# Patient Record
Sex: Female | Born: 1985 | Race: Black or African American | Hispanic: No | Marital: Single | Smoking: Never smoker
Health system: Southern US, Community
[De-identification: ages and names within clinical notes are randomized; demographics above are authoritative.]

## PROBLEM LIST (undated history)

## (undated) DIAGNOSIS — Z87898 Personal history of other specified conditions: Secondary | ICD-10-CM

## (undated) DIAGNOSIS — I272 Pulmonary hypertension, unspecified: Secondary | ICD-10-CM

## (undated) DIAGNOSIS — E669 Obesity, unspecified: Secondary | ICD-10-CM

## (undated) DIAGNOSIS — E039 Hypothyroidism, unspecified: Secondary | ICD-10-CM

## (undated) DIAGNOSIS — R0602 Shortness of breath: Secondary | ICD-10-CM

## (undated) DIAGNOSIS — Z6841 Body Mass Index (BMI) 40.0 and over, adult: Secondary | ICD-10-CM

## (undated) DIAGNOSIS — I5043 Acute on chronic combined systolic (congestive) and diastolic (congestive) heart failure: Principal | ICD-10-CM

## (undated) DIAGNOSIS — E042 Nontoxic multinodular goiter: Secondary | ICD-10-CM

## (undated) DIAGNOSIS — R51 Headache: Secondary | ICD-10-CM

## (undated) DIAGNOSIS — Z9889 Other specified postprocedural states: Secondary | ICD-10-CM

## (undated) DIAGNOSIS — S92909A Unspecified fracture of unspecified foot, initial encounter for closed fracture: Secondary | ICD-10-CM

## (undated) DIAGNOSIS — N921 Excessive and frequent menstruation with irregular cycle: Secondary | ICD-10-CM

## (undated) DIAGNOSIS — I5042 Chronic combined systolic (congestive) and diastolic (congestive) heart failure: Secondary | ICD-10-CM

## (undated) DIAGNOSIS — I340 Nonrheumatic mitral (valve) insufficiency: Secondary | ICD-10-CM

## (undated) DIAGNOSIS — D649 Anemia, unspecified: Secondary | ICD-10-CM

## (undated) DIAGNOSIS — I428 Other cardiomyopathies: Secondary | ICD-10-CM

## (undated) HISTORY — DX: Body Mass Index (BMI) 40.0 and over, adult: Z684

## (undated) HISTORY — PX: EXTERNAL EAR SURGERY: SHX627

## (undated) HISTORY — DX: Morbid (severe) obesity due to excess calories: E66.01

## (undated) HISTORY — DX: Chronic combined systolic (congestive) and diastolic (congestive) heart failure: I50.42

---

## 2013-09-11 ENCOUNTER — Inpatient Hospital Stay (HOSPITAL_COMMUNITY)
Admission: EM | Admit: 2013-09-11 | Discharge: 2013-09-14 | DRG: 286 | Disposition: A | Discharge status: Home / Self Care | Payer: BC Managed Care – PPO | Attending: Internal Medicine | Admitting: Internal Medicine

## 2013-09-11 DIAGNOSIS — I5043 Acute on chronic combined systolic (congestive) and diastolic (congestive) heart failure: Principal | ICD-10-CM | POA: Diagnosis present

## 2013-09-11 DIAGNOSIS — E042 Nontoxic multinodular goiter: Secondary | ICD-10-CM

## 2013-09-11 DIAGNOSIS — I059 Rheumatic mitral valve disease, unspecified: Secondary | ICD-10-CM | POA: Diagnosis present

## 2013-09-11 DIAGNOSIS — Z23 Encounter for immunization: Secondary | ICD-10-CM

## 2013-09-11 DIAGNOSIS — I272 Pulmonary hypertension, unspecified: Secondary | ICD-10-CM

## 2013-09-11 DIAGNOSIS — I2789 Other specified pulmonary heart diseases: Secondary | ICD-10-CM | POA: Diagnosis present

## 2013-09-11 DIAGNOSIS — Z79899 Other long term (current) drug therapy: Secondary | ICD-10-CM

## 2013-09-11 DIAGNOSIS — Z6841 Body Mass Index (BMI) 40.0 and over, adult: Secondary | ICD-10-CM

## 2013-09-11 DIAGNOSIS — R0602 Shortness of breath: Secondary | ICD-10-CM | POA: Diagnosis present

## 2013-09-11 DIAGNOSIS — J9600 Acute respiratory failure, unspecified whether with hypoxia or hypercapnia: Secondary | ICD-10-CM | POA: Diagnosis present

## 2013-09-11 DIAGNOSIS — E669 Obesity, unspecified: Secondary | ICD-10-CM | POA: Diagnosis present

## 2013-09-11 DIAGNOSIS — R0603 Acute respiratory distress: Secondary | ICD-10-CM | POA: Diagnosis present

## 2013-09-11 DIAGNOSIS — I509 Heart failure, unspecified: Secondary | ICD-10-CM | POA: Diagnosis present

## 2013-09-11 DIAGNOSIS — I340 Nonrheumatic mitral (valve) insufficiency: Secondary | ICD-10-CM

## 2013-09-11 DIAGNOSIS — D509 Iron deficiency anemia, unspecified: Secondary | ICD-10-CM

## 2013-09-11 DIAGNOSIS — J810 Acute pulmonary edema: Secondary | ICD-10-CM

## 2013-09-11 DIAGNOSIS — N920 Excessive and frequent menstruation with regular cycle: Secondary | ICD-10-CM | POA: Diagnosis present

## 2013-09-11 DIAGNOSIS — E039 Hypothyroidism, unspecified: Secondary | ICD-10-CM | POA: Diagnosis present

## 2013-09-11 DIAGNOSIS — Z8249 Family history of ischemic heart disease and other diseases of the circulatory system: Secondary | ICD-10-CM

## 2013-09-11 DIAGNOSIS — D500 Iron deficiency anemia secondary to blood loss (chronic): Secondary | ICD-10-CM | POA: Diagnosis present

## 2013-09-11 DIAGNOSIS — J189 Pneumonia, unspecified organism: Secondary | ICD-10-CM | POA: Diagnosis present

## 2013-09-11 HISTORY — DX: Obesity, unspecified: E66.9

## 2013-09-11 HISTORY — DX: Unspecified fracture of unspecified foot, initial encounter for closed fracture: S92.909A

## 2013-09-11 HISTORY — DX: Hypothyroidism, unspecified: E03.9

## 2013-09-11 HISTORY — DX: Pulmonary hypertension, unspecified: I27.20

## 2013-09-11 HISTORY — DX: Acute on chronic combined systolic (congestive) and diastolic (congestive) heart failure: I50.43

## 2013-09-11 HISTORY — DX: Excessive and frequent menstruation with irregular cycle: N92.1

## 2013-09-11 HISTORY — DX: Nontoxic multinodular goiter: E04.2

## 2013-09-11 HISTORY — DX: Nonrheumatic mitral (valve) insufficiency: I34.0

## 2013-09-11 HISTORY — DX: Anemia, unspecified: D64.9

## 2013-09-12 ENCOUNTER — Encounter (HOSPITAL_COMMUNITY): Payer: Self-pay | Admitting: Emergency Medicine

## 2013-09-12 ENCOUNTER — Emergency Department (HOSPITAL_COMMUNITY): Payer: BC Managed Care – PPO

## 2013-09-12 DIAGNOSIS — J189 Pneumonia, unspecified organism: Secondary | ICD-10-CM

## 2013-09-12 DIAGNOSIS — R0603 Acute respiratory distress: Secondary | ICD-10-CM | POA: Diagnosis present

## 2013-09-12 DIAGNOSIS — J810 Acute pulmonary edema: Secondary | ICD-10-CM | POA: Diagnosis present

## 2013-09-12 DIAGNOSIS — R0989 Other specified symptoms and signs involving the circulatory and respiratory systems: Secondary | ICD-10-CM

## 2013-09-12 DIAGNOSIS — I5043 Acute on chronic combined systolic (congestive) and diastolic (congestive) heart failure: Principal | ICD-10-CM

## 2013-09-12 DIAGNOSIS — J9600 Acute respiratory failure, unspecified whether with hypoxia or hypercapnia: Secondary | ICD-10-CM

## 2013-09-12 DIAGNOSIS — R0609 Other forms of dyspnea: Secondary | ICD-10-CM

## 2013-09-12 DIAGNOSIS — I340 Nonrheumatic mitral (valve) insufficiency: Secondary | ICD-10-CM

## 2013-09-12 DIAGNOSIS — I509 Heart failure, unspecified: Secondary | ICD-10-CM

## 2013-09-12 DIAGNOSIS — I059 Rheumatic mitral valve disease, unspecified: Secondary | ICD-10-CM

## 2013-09-12 DIAGNOSIS — R0602 Shortness of breath: Secondary | ICD-10-CM | POA: Diagnosis present

## 2013-09-12 HISTORY — DX: Acute on chronic combined systolic (congestive) and diastolic (congestive) heart failure: I50.43

## 2013-09-12 HISTORY — DX: Nonrheumatic mitral (valve) insufficiency: I34.0

## 2013-09-12 LAB — CBC WITH DIFFERENTIAL/PLATELET
Basophils Absolute: 0 10*3/uL (ref 0.0–0.1)
Basophils Relative: 0 % (ref 0–1)
EOS PCT: 1 % (ref 0–5)
Eosinophils Absolute: 0.1 10*3/uL (ref 0.0–0.7)
HCT: 31.7 % — ABNORMAL LOW (ref 36.0–46.0)
Hemoglobin: 9.8 g/dL — ABNORMAL LOW (ref 12.0–15.0)
LYMPHS PCT: 29 % (ref 12–46)
Lymphs Abs: 2 10*3/uL (ref 0.7–4.0)
MCH: 22.3 pg — ABNORMAL LOW (ref 26.0–34.0)
MCHC: 30.9 g/dL (ref 30.0–36.0)
MCV: 72 fL — ABNORMAL LOW (ref 78.0–100.0)
MONOS PCT: 9 % (ref 3–12)
Monocytes Absolute: 0.6 10*3/uL (ref 0.1–1.0)
NEUTROS PCT: 61 % (ref 43–77)
Neutro Abs: 4.1 10*3/uL (ref 1.7–7.7)
PLATELETS: 286 10*3/uL (ref 150–400)
RBC: 4.4 MIL/uL (ref 3.87–5.11)
RDW: 17.6 % — AB (ref 11.5–15.5)
WBC: 6.8 10*3/uL (ref 4.0–10.5)

## 2013-09-12 LAB — INFLUENZA PANEL BY PCR (TYPE A & B)
H1N1FLUPCR: NOT DETECTED
Influenza A By PCR: NEGATIVE
Influenza B By PCR: NEGATIVE

## 2013-09-12 LAB — BASIC METABOLIC PANEL
BUN: 14 mg/dL (ref 6–23)
CALCIUM: 8.7 mg/dL (ref 8.4–10.5)
CO2: 20 mEq/L (ref 19–32)
CREATININE: 0.94 mg/dL (ref 0.50–1.10)
Chloride: 103 mEq/L (ref 96–112)
GFR, EST NON AFRICAN AMERICAN: 83 mL/min — AB (ref >90)
Glucose, Bld: 118 mg/dL — ABNORMAL HIGH (ref 70–99)
Potassium: 3.6 mEq/L — ABNORMAL LOW (ref 3.7–5.3)
SODIUM: 138 meq/L (ref 137–147)

## 2013-09-12 LAB — T4, FREE: FREE T4: 1.4 ng/dL (ref 0.80–1.80)

## 2013-09-12 LAB — TROPONIN I
Troponin I: <0.3 ng/mL (ref <0.30)
Troponin I: <0.3 ng/mL (ref <0.30)
Troponin I: <0.3 ng/mL (ref <0.30)

## 2013-09-12 LAB — POCT I-STAT, CHEM 8
BUN: 13 mg/dL (ref 6–23)
CALCIUM ION: 1.16 mmol/L (ref 1.12–1.23)
CHLORIDE: 107 meq/L (ref 96–112)
Creatinine, Ser: 1 mg/dL (ref 0.50–1.10)
GLUCOSE: 117 mg/dL — AB (ref 70–99)
HEMATOCRIT: 35 % — AB (ref 36.0–46.0)
Hemoglobin: 11.9 g/dL — ABNORMAL LOW (ref 12.0–15.0)
Potassium: 3.7 mEq/L (ref 3.7–5.3)
Sodium: 142 mEq/L (ref 137–147)
TCO2: 23 mmol/L (ref 0–100)

## 2013-09-12 LAB — PREGNANCY, URINE: Preg Test, Ur: NEGATIVE

## 2013-09-12 LAB — PROTIME-INR
INR: 0.99 (ref 0.00–1.49)
PROTHROMBIN TIME: 12.9 s (ref 11.6–15.2)

## 2013-09-12 LAB — POCT I-STAT TROPONIN I: Troponin i, poc: 0.01 ng/mL (ref 0.00–0.08)

## 2013-09-12 LAB — OCCULT BLOOD X 1 CARD TO LAB, STOOL: Fecal Occult Bld: NEGATIVE

## 2013-09-12 LAB — IRON AND TIBC: UIBC: 386 ug/dL (ref 125–400)

## 2013-09-12 LAB — PRO B NATRIURETIC PEPTIDE: PRO B NATRI PEPTIDE: 260 pg/mL — AB (ref 0–125)

## 2013-09-12 LAB — FERRITIN: Ferritin: 6 ng/mL — ABNORMAL LOW (ref 10–291)

## 2013-09-12 LAB — TSH: TSH: 2.62 u[IU]/mL (ref 0.350–4.500)

## 2013-09-12 LAB — APTT: aPTT: 27 seconds (ref 24–37)

## 2013-09-12 MED ORDER — ASPIRIN 81 MG PO CHEW
324.0000 mg | CHEWABLE_TABLET | Freq: Once | ORAL | Status: AC
  Administered 2013-09-12: 324 mg via ORAL
  Filled 2013-09-12: qty 4

## 2013-09-12 MED ORDER — FUROSEMIDE 10 MG/ML IJ SOLN
INTRAMUSCULAR | Status: AC
  Administered 2013-09-12: 20 mg via INTRAVENOUS
  Filled 2013-09-12: qty 4

## 2013-09-12 MED ORDER — INFLUENZA VAC SPLIT QUAD 0.5 ML IM SUSP
0.5000 mL | INTRAMUSCULAR | Status: DC
  Filled 2013-09-12 (×2): qty 0.5

## 2013-09-12 MED ORDER — IOHEXOL 350 MG/ML SOLN
80.0000 mL | Freq: Once | INTRAVENOUS | Status: AC | PRN
  Administered 2013-09-12: 80 mL via INTRAVENOUS

## 2013-09-12 MED ORDER — FERROUS SULFATE 325 (65 FE) MG PO TABS
325.0000 mg | ORAL_TABLET | Freq: Two times a day (BID) | ORAL | Status: DC
  Administered 2013-09-12 – 2013-09-14 (×5): 325 mg via ORAL
  Filled 2013-09-12 (×7): qty 1

## 2013-09-12 MED ORDER — ONDANSETRON HCL 4 MG/2ML IJ SOLN: 4.0000 mg | Freq: Three times a day (TID) | INTRAMUSCULAR | Status: AC | PRN

## 2013-09-12 MED ORDER — ENOXAPARIN SODIUM 40 MG/0.4ML ~~LOC~~ SOLN
40.0000 mg | SUBCUTANEOUS | Status: DC
  Filled 2013-09-12 (×2): qty 0.4

## 2013-09-12 MED ORDER — LEVOFLOXACIN 750 MG PO TABS: 750.0000 mg | ORAL_TABLET | Freq: Every day | ORAL | Status: DC

## 2013-09-12 MED ORDER — IPRATROPIUM-ALBUTEROL 0.5-2.5 (3) MG/3ML IN SOLN: 3.0000 mL | RESPIRATORY_TRACT | Status: DC | PRN

## 2013-09-12 MED ORDER — SODIUM CHLORIDE 0.9 % IV SOLN
Freq: Once | INTRAVENOUS | Status: AC
  Administered 2013-09-12: via INTRAVENOUS

## 2013-09-12 MED ORDER — LEVOTHYROXINE SODIUM 75 MCG PO TABS
75.0000 ug | ORAL_TABLET | Freq: Every day | ORAL | Status: DC
  Administered 2013-09-12 – 2013-09-14 (×3): 75 ug via ORAL
  Filled 2013-09-12 (×4): qty 1

## 2013-09-12 MED ORDER — ASPIRIN 81 MG PO CHEW
81.0000 mg | CHEWABLE_TABLET | ORAL | Status: AC
  Administered 2013-09-13: 81 mg via ORAL
  Filled 2013-09-12: qty 1

## 2013-09-12 MED ORDER — ASPIRIN EC 325 MG PO TBEC
325.0000 mg | DELAYED_RELEASE_TABLET | Freq: Every day | ORAL | Status: DC
  Administered 2013-09-12: 325 mg via ORAL
  Filled 2013-09-12: qty 1

## 2013-09-12 MED ORDER — ONDANSETRON HCL 4 MG/2ML IJ SOLN
4.0000 mg | Freq: Four times a day (QID) | INTRAMUSCULAR | Status: DC | PRN
  Administered 2013-09-12: 4 mg via INTRAVENOUS
  Filled 2013-09-12: qty 2

## 2013-09-12 MED ORDER — ENOXAPARIN SODIUM 40 MG/0.4ML ~~LOC~~ SOLN
40.0000 mg | SUBCUTANEOUS | Status: DC
  Administered 2013-09-13: 40 mg via SUBCUTANEOUS
  Filled 2013-09-12 (×3): qty 0.4

## 2013-09-12 MED ORDER — FUROSEMIDE 10 MG/ML IJ SOLN
20.0000 mg | Freq: Once | INTRAMUSCULAR | Status: AC
  Administered 2013-09-12: 20 mg via INTRAVENOUS

## 2013-09-12 MED ORDER — ACETAMINOPHEN 325 MG PO TABS
650.0000 mg | ORAL_TABLET | ORAL | Status: DC | PRN
  Administered 2013-09-12 – 2013-09-13 (×3): 650 mg via ORAL
  Filled 2013-09-12 (×3): qty 2

## 2013-09-12 MED ORDER — POTASSIUM CHLORIDE CRYS ER 20 MEQ PO TBCR
30.0000 meq | EXTENDED_RELEASE_TABLET | Freq: Once | ORAL | Status: AC
  Administered 2013-09-12: 30 meq via ORAL
  Filled 2013-09-12: qty 1

## 2013-09-12 MED ORDER — SODIUM CHLORIDE 0.9 % IV SOLN
INTRAVENOUS | Status: DC
  Administered 2013-09-13: 06:00:00 via INTRAVENOUS

## 2013-09-12 MED ORDER — ASPIRIN EC 81 MG PO TBEC
81.0000 mg | DELAYED_RELEASE_TABLET | Freq: Every day | ORAL | Status: DC
  Administered 2013-09-14: 81 mg via ORAL
  Filled 2013-09-12 (×2): qty 1

## 2013-09-12 MED ORDER — METOPROLOL TARTRATE 25 MG PO TABS
25.0000 mg | ORAL_TABLET | Freq: Two times a day (BID) | ORAL | Status: DC
  Administered 2013-09-12 – 2013-09-14 (×5): 25 mg via ORAL
  Filled 2013-09-12 (×6): qty 1

## 2013-09-12 NOTE — H&P (Deleted)
Note accidentally saved as H/p. See consult note.

## 2013-09-12 NOTE — Progress Notes (Signed)
[facility] PROGRESS NOTE  Paula Wolfe IRW:[id number] DOB: 10/17/1985 DOA: 09/11/2013 PCP: Domingo Sep, MD  Assessment/Plan: SOB -Resolved. -CT Angio with ground-glass opacities ?PNA vs edema. -ECHO reviewed: preserved EF, severe MR. -Continue levaquin. -Have discussed with cardiology, who will see her in consultation.  Severe MR -Cards to see.  Hypothyroidism -TSH WNL. -Continue home dose of synthroid.  Code Status: Full Code Family Communication: Patient only  Disposition Plan: Home when medically ready.   Consultants:  Cardiology   Antibiotics:  Levaquin   Subjective: No complaints. SOB has resolved.  Objective: Filed Vitals:   09/12/13 0508 09/12/13 0612 09/12/13 1100 09/12/13 1215  BP: 148/89   136/78  Pulse: 107 104  101  Temp: 97.8 F (36.6 C)   97.6 F (36.4 C)  TempSrc: Oral   Oral  Resp: 18   17  Height: 5\' 2"  (1.575 m)  5\' 2"  (1.575 m)   Weight: 119.614 kg (263 lb 11.2 oz)  120.249 kg (265 lb 1.6 oz)   SpO2: 100%   100%    Intake/Output Summary (Last 24 hours) at 09/12/13 1527 Last data filed at 09/12/13 0720  Gross per 24 hour  Intake      0 ml  Output   1000 ml  Net  -1000 ml   Filed Weights   09/12/13 0005 09/12/13 0508 09/12/13 1100  Weight: 117.935 kg (260 lb) 119.614 kg (263 lb 11.2 oz) 120.249 kg (265 lb 1.6 oz)    Exam:   General:  AA Ox3, obese  Cardiovascular: RRR  Respiratory: CTA B  Abdomen: obese, S/NT/ND/+BS  Extremities: no C/C/E   Neurologic:  Intact, non-focal  Data Reviewed: Basic Metabolic Panel:  Recent Labs Lab 09/12/13 0013 09/12/13 0020  NA 138 142  K 3.6* 3.7  CL 103 107  CO2 20  --   GLUCOSE 118* 117*  BUN 14 13  CREATININE 0.94 1.00  CALCIUM 8.7  --    Liver Function Tests: No results found for this basename: AST, ALT, ALKPHOS, BILITOT, PROT, ALBUMIN,  in the last 168 hours No results found for this basename: LIPASE, AMYLASE,  in the last 168 hours No results found  for this basename: AMMONIA,  in the last 168 hours CBC:  Recent Labs Lab 09/12/13 0013 09/12/13 0020  WBC 6.8  --   NEUTROABS 4.1  --   HGB 9.8* 11.9*  HCT 31.7* 35.0*  MCV 72.0*  --   PLT 286  --    Cardiac Enzymes:  Recent Labs Lab 09/12/13 0013 09/12/13 0546 09/12/13 1026  TROPONINI <0.30 <0.30 <0.30   BNP (last 3 results)  Recent Labs  09/12/13 0013  PROBNP 260.0*   CBG: No results found for this basename: GLUCAP,  in the last 168 hours  No results found for this or any previous visit (from the past 240 hour(s)).   Studies: Ct Angio Chest Pe W/cm &/or Wo Cm  09/12/2013   CLINICAL DATA:  Acute onset dyspnea  EXAM: CT ANGIOGRAPHY CHEST WITH CONTRAST  TECHNIQUE: Multidetector CT imaging of the chest was performed using the standard protocol during bolus administration of intravenous contrast. Multiplanar CT image reconstructions including MIPs were obtained to evaluate the vascular anatomy.  CONTRAST:  32mL OMNIPAQUE IOHEXOL 350 MG/ML SOLN  COMPARISON:  09/12/2013 chest radiograph  FINDINGS: The bolus timing is not optimized to evaluate the pulmonary arteries and the study is further degraded by respiratory motion. Allowing for this, no central pulmonary embolism. The distal segmental and  subsegmental branches are nondiagnostic.  The thyroid gland appears enlarged.  Normal caliber aorta.  Cardiac enlargement.  No pleural or pericardial effusion.  No intrathoracic lymphadenopathy.  Upper abdominal images show nothing acute.  Central airways are patent.  No pneumothorax.  Bilateral areas of ground-glass opacity and mosaic attenuation.  No acute osseous finding.  Review of the MIP images confirms the above findings.  IMPRESSION: Suboptimal contrast bolus timing. No central pulmonary embolism. The distal segmental and subsegmental branches are nondiagnostic.  Bilateral areas of ground-glass opacity may reflect interstitial pneumonia or edema.  Areas of mosaic attenuation may reflect  air trapping/small airways disease.  Mild cardiomegaly.  Thyromegaly.   Electronically Signed   By: Jearld LeschAndrew  DelGaizo M.D.   On: 09/12/2013 03:16   Dg Chest Port 1 View  09/12/2013   CLINICAL DATA:  Chest pain, shortness of breath  EXAM: PORTABLE CHEST - 1 VIEW  COMPARISON:  None.  FINDINGS: Prominent heart size but normal vascularity. No edema, pneumonia, collapse or consolidation. No effusion or pneumothorax. Trachea midline.  IMPRESSION: No active disease.   Electronically Signed   By: Ruel Favorsrevor  Shick M.D.   On: 09/12/2013 00:55    Scheduled Meds: . aspirin EC  325 mg Oral Daily  . enoxaparin (LOVENOX) injection  40 mg Subcutaneous Q24H  . ferrous sulfate  325 mg Oral BID WC  . [START ON 09/13/2013] influenza vac split quadrivalent PF  0.5 mL Intramuscular Tomorrow-1000  . levothyroxine  75 mcg Oral QAC breakfast  . metoprolol tartrate  25 mg Oral BID   Continuous Infusions:   Principal Problem:   SOB (shortness of breath) Active Problems:   Atypical pneumonia   Mitral regurgitation    Time spent: 40 minutes. Greater than 50% of this time was spent in direct contact with the patient coordinating care.    Chaya JanHERNANDEZ ACOSTA,ESTELA  [facility] Pager [phone]  If 7PM-7AM, please contact night-coverage at [email or web address], password [email or web address] 09/12/2013, 3:27 PM  LOS: 1 day

## 2013-09-12 NOTE — Progress Notes (Signed)
UR Completed Deborrah Mabin Graves-Bigelow, RN,BSN [phone]  

## 2013-09-12 NOTE — Progress Notes (Signed)
  Echocardiogram 2D Echocardiogram has been performed.  Cathie Beams 09/12/2013, 12:53 PM

## 2013-09-12 NOTE — ED Notes (Signed)
Per EMS - acute onset of dyspnea, pt was on NRB mask by fire dept w/ pulse ox of 76% - pt in tripod position, pt found initially found w/ diminished lower lung sounds and w/ in a few minutes pt began having rhales in all fields, pt also began coughing up pink frothy sputum, pt started on CPAP and given alb tx, pt also given 4mg  zofran IVP d/t n/v and 125mg  solumedrol IVP by EMS en route. On arrival to department, pt w/ accessory muscle use and only able to speak phrases. Dr. Hyacinth Meeker at bedside on pt arrival.

## 2013-09-12 NOTE — Consult Note (Signed)
Cardiology Consultation Note  Patient ID: Paula Wolfe, MRN: [id number], DOB/AGE: [birth year] 27 y.o. Admit date: 09/11/2013   Date of Consult: 09/12/2013 Primary Physician: Domingo Sep, MD Primary Cardiologist: New to [facility]  Chief Complaint: SOB Reason for Consult: severe MR  HPI: Paula Wolfe is a 28 y/o F with history of hypothyroidism, nonmalignant thyroid nodules, menometrorrhagia, & anemia (Hgb 9.6 in Oct 2014) who presented to [facility] with acute SOB. We are asked to see for new finding of severe MR by echocardiogram. She does report that her parents told her as a child she had some sort of valve problem that was discovered in regular check-ups but denies subsequent workup for this. Denies hx of rheumatic fever or any OTC/herbal/diet supplements.  She was in her usual state of health yesterday watching a movie when she developed sudden dyspnea. Her breathing quickened and she had some chest tightness thus EMS was called. She spit up frothy pink/red sputum. EMS place the patient on CPAP due to POx 76% and gave her some albuterol nebulizer and Solu-Medrol. She then developed vomiting that was treated with Zofran. She arrived on Bipap and was titrated down to [place]. Troponins neg x 4 and pBNP 260. She was still anemic with Hgb 9.8, microcytic, severe iron deficiency with ferritin 6 and iron <10. Flu negative and thyroid function normal. She states she had been exercising this past year because she no longer wanted to be overweight and went from 290-270. She denies any exertional symptoms. 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 She still has been eating healthier and as a result has lost another 5 lbs over 2 months. Denies palpitations, orthopnea, syncope, BRBPR, melena, hematemesis, fever, chills. She has chronic L ankle edema after a broken foot that is not significantly changed. She has occasional chest  tightness, about 1x/month, associated with increased episodes of stress. She was given 20mg  IV Lasix this morning and placed on Lopressor 25mg  BID, - 1L UOP recorded thus far. CT angio showed no central PE (suboptimal bolus timing), ground glass opacity ? Interstitial edema or pneumonia, mild cardiomegaly, thyromegaly. 2D echo showed EF 50-55%, no RWMA, severe MR, severely dilated LA, PAP . She and her boyfriend deny snoring or apneic events. Only medicine she's on is levothyroxine. Denies any OTC supplements medications aside from Advil during menstruation.  Past Medical History  Diagnosis Date  . Hypothyroidism   . Nontoxic multinodular goiter     a. Pt reports prior ultrasound/bx that were negative but was told she would need to keep an eye on them in the future. Benign FNA per [facility] for this dx.  . Menometrorrhagia     a. Improved after tx of hypothyroidism, but recurred 05/2013.  Marland Kitchen Heart valve problem     a. Pt states she was told this as a child.  . Anemia     a. Hgb 9.6 in 05/2013.  . Broken foot     a. Remote broken foot with residual chronic LLE edema.      Most Recent Cardiac Studies: 2D Echo 09/12/13 - Left ventricle: The cavity size was mildly dilated. Systolic function was normal. The estimated ejection fraction was in the range of 50% to 55%. Wall motion was normal; there were no regional wall motion abnormalities. - Mitral valve: Severe regurgitation. - Left atrium: The atrium was severely dilated. - Pulmonary arteries: Systolic pressure was mildly increased. PA  peak pressure: 35mm Hg (S). Transthoracic echocardiography. M-mode, complete 2D,    Surgical History:  Past Surgical History  Procedure Laterality Date  . External ear surgery       Home Meds: Prior to Admission medications   Medication Sig Start Date End Date Taking? Authorizing Provider  levothyroxine (SYNTHROID, LEVOTHROID) 75 MCG tablet Take 75 mcg by mouth daily before breakfast.   Yes  Historical Provider, MD    Inpatient Medications:  . [START ON 09/13/2013] aspirin EC  81 mg Oral Daily  . enoxaparin (LOVENOX) injection  40 mg Subcutaneous Q24H  . ferrous sulfate  325 mg Oral BID WC  . [START ON 09/13/2013] influenza vac split quadrivalent PF  0.5 mL Intramuscular Tomorrow-1000  . levothyroxine  75 mcg Oral QAC breakfast  . metoprolol tartrate  25 mg Oral BID      Allergies: No Known Allergies  History   Social History  . Marital Status: Single    Spouse Name: N/A    Number of Children: N/A  . Years of Education: N/A   Occupational History  .      Teaching   Social History Main Topics  . Smoking status: Never Smoker   . Smokeless tobacco: Never Used  . Alcohol Use: Yes     Comment: occasionally - 1-2x/month  . Drug Use: No  . Sexual Activity: Yes    Birth Control/ Protection: None   Other Topics Concern  . Not on file   Social History Narrative  . No narrative on file     Family History  Problem Relation Age of Onset  . Hypertension Mother   . Hypertension Sister   . Hypertension Father   . Heart Problems Maternal Grandfather     details unclear     Review of Systems: General: negative for chills, fever, night sweats Cardiovascular: see above Dermatological: negative for rash Urologic: negative for hematuria Abdominal: negative for nausea, vomiting, diarrhea, bright red blood per rectum, melena, or hematemesis Neurologic: negative for visual changes, syncope, or dizziness All other systems reviewed and are otherwise negative except as noted above.  Labs:  Recent Labs  09/12/13 0013 09/12/13 0546 09/12/13 1026  TROPONINI <0.30 <0.30 <0.30   Lab Results  Component Value Date   WBC 6.8 09/12/2013   HGB 11.9* 09/12/2013   HCT 35.0* 09/12/2013   MCV 72.0* 09/12/2013   PLT 286 09/12/2013     Recent Labs Lab 09/12/13 0013 09/12/13 0020  NA 138 142  K 3.6* 3.7  CL 103 107  CO2 20  --   BUN 14 13  CREATININE 0.94 1.00    CALCIUM 8.7  --   GLUCOSE 118* 117*   Radiology/Studies:  Ct Angio Chest Pe W/cm &/or Wo Cm 09/12/2013   CLINICAL DATA:  Acute onset dyspnea  EXAM: CT ANGIOGRAPHY CHEST WITH CONTRAST  TECHNIQUE: Multidetector CT imaging of the chest was performed using the standard protocol during bolus administration of intravenous contrast. Multiplanar CT image reconstructions including MIPs were obtained to evaluate the vascular anatomy.  CONTRAST:  80mL OMNIPAQUE IOHEXOL 350 MG/ML SOLN  COMPARISON:  09/12/2013 chest radiograph  FINDINGS: The bolus timing is not optimized to evaluate the pulmonary arteries and the study is further degraded by respiratory motion. Allowing for this, no central pulmonary embolism. The distal segmental and subsegmental branches are nondiagnostic.  The thyroid gland appears enlarged.  Normal caliber aorta.  Cardiac enlargement.  No pleural or pericardial effusion.  No intrathoracic lymphadenopathy.  Upper abdominal  images show nothing acute.  Central airways are patent.  No pneumothorax.  Bilateral areas of ground-glass opacity and mosaic attenuation.  No acute osseous finding.  Review of the MIP images confirms the above findings.  IMPRESSION: Suboptimal contrast bolus timing. No central pulmonary embolism. The distal segmental and subsegmental branches are nondiagnostic.  Bilateral areas of ground-glass opacity may reflect interstitial pneumonia or edema.  Areas of mosaic attenuation may reflect air trapping/small airways disease.  Mild cardiomegaly.  Thyromegaly.   Electronically Signed   By: Jearld LeschAndrew  DelGaizo M.D.   On: 09/12/2013 03:16   Dg Chest Port 1 View 09/12/2013   CLINICAL DATA:  Chest pain, shortness of breath  EXAM: PORTABLE CHEST - 1 VIEW  COMPARISON:  None.  FINDINGS: Prominent heart size but normal vascularity. No edema, pneumonia, collapse or consolidation. No effusion or pneumothorax. Trachea midline.  IMPRESSION: No active disease.   Electronically Signed   By: Ruel Favorsrevor  Shick  M.D.   On: 09/12/2013 00:55   EKG: sinus tach 107 no significant ECG changes, borderline prolonged QT interval  Physical Exam: Blood pressure 136/78, pulse 101, temperature 97.6 F (36.4 C), temperature source Oral, resp. rate 17, height 5\' 2"  (1.575 m), weight 265 lb 1.6 oz (120.249 kg), last menstrual period 08/22/2013, SpO2 100.00%. General: Well developed, well nourished overweight AAF in no acute distress. Head: Normocephalic, atraumatic, sclera non-icteric, no xanthomas, nares are without discharge.  Neck: Negative for carotid bruits. JVD not elevated. Lungs: Coarse but clear bilaterally to auscultation without wheezes, rales, or rhonchi. Breathing is unlabored. Heart: Tachycardic, regular with S1 S2. 2/6 SEM at apex but also light systolic murmur at RUSB. No rubs gallops appreciated. Abdomen: Soft, non-tender, non-distended with normoactive bowel sounds. No hepatomegaly. No rebound/guarding. No obvious abdominal masses. Msk:  Strength and tone appear normal for age. Extremities: No clubbing or cyanosis. Tr pedal edema.  Distal pedal pulses are 2+ and equal bilaterally. Neuro: Alert and oriented X 3. No facial asymmetry. No focal deficit. Moves all extremities spontaneously. Psych:  Responds to questions appropriately with a normal affect.   Assessment and Plan:   1. Acute respiratory failure 2. ?Pulmonary edema due to severe MR 3. Hypothyroidism, currently euthyroid 4. Chronic microcytic anemia 5. Menometrorrhagia  Patient seen and examined with Dr. Eden EmmsNishan. Etiology of MR is unclear. Although presentation of SOB was fairly abrupt with pink frothy sputum, she does describe some sort of heart valve issue in the past. At this time we are [facility] along with TEE to further evaluate - Dr. Eden EmmsNishan has discussed this with he patient who is in agreement. No clinical evidence of endocarditis and TEE will help us look further. Lungs currently sound clear thus will hold off on further  diuresis. Follow volume. Decrease ASA. Check urine pregnancy test for completeness. Agree with iron supplementation; not sure if she's someone who may need consideration of IV iron to rebuild stores but will defer to IM.  Signed, Ronie Spiesayna Dunn PA-C 09/12/2013, 4:34 PM   Patient examined chart reviewed. Discussed care with patient and boyfriend.  Clinical presentation is unusual.  Consistant with flash pulmonary edema but BNP only in 200 range.  CXR read as NAD but has vascular congestion and cephalazation to my eye.  CT poor quality no obvious PE.  She has a history of some sort of heart valve problem  Echo reviewed.  LVE with EF ? 50% .  Posterior leaflet of mitral valve tethered and thickened  PISA radius  Only . 6  And I would have qualitatively graded MR as moderate .  Will arrange TEE and right and left heart cath for am.  TEE should inlcude PISA quantification and will give [place] better idea of morphology and mechanism of MR since I don't see frank prolapse.  Right heart to assess PA pressures and EDP  Risks including stroke discussed willing to proceed  Charlton Haws

## 2013-09-12 NOTE — ED Notes (Signed)
Report given to 3 west unit nurse , admitting MD at bedside , denies chest pain / respirations unlabored .

## 2013-09-12 NOTE — ED Provider Notes (Signed)
CSN: [id number]     Arrival date & time 09/11/13  2358 History   First MD Initiated Contact with Patient 09/12/13 0002     Chief Complaint  Patient presents with  . Shortness of Breath   (Consider location/radiation/quality/duration/timing/severity/associated sxs/prior Treatment) HPI Comments: 28 year old female with a history of hyperthyroidism but no other significant medical history who presents with acute onset of shortness of breath. She states that just prior to arrival she was sitting on her couch watching TV when she felt acute shortness of breath which was severe, persistent and not associated with chest pain. She does have chronic swelling of her lower extremities left greater than right, she denies any symptoms of fevers, chills, nausea or vomiting prior to this happening today. She has been feeling very well. She denies a history of DVT, and PE, cardiac history, COPD and denies using any cocaine. Paramedics found the patient hypoxic at 76%, she was tripod position, diminished lung sounds and coughing up pink frothy sputum. They started CPAP immediately, gave albuterol nebulizer, Solu-Medrol and 4 mg of Zofran as the patient vomited after starting CPAP. The patient was in acute respiratory distress.  Patient is a 28 y.o. female presenting with shortness of breath. The history is provided by the patient and the EMS personnel.  Shortness of Breath   Past Medical History  Diagnosis Date  . Hypothyroidism    History reviewed. No pertinent past surgical history. No family history on file. History  Substance Use Topics  . Smoking status: Never Smoker   . Smokeless tobacco: Not on file  . Alcohol Use: Yes     Comment: occasionally   OB History   Grav Para Term Preterm Abortions TAB SAB Ect Mult Living                 Review of Systems  Respiratory: Positive for shortness of breath.   All other systems reviewed and are negative.    Allergies  Review of patient's allergies  indicates no known allergies.  Home Medications   Current Outpatient Rx  Name  Route  Sig  Dispense  Refill  . levothyroxine (SYNTHROID, LEVOTHROID) 75 MCG tablet   Oral   Take 75 mcg by mouth daily before breakfast.          BP 138/88  Pulse 104  Temp(Src) 98.8 F (37.1 C) (Oral)  Resp 14  Ht 5\' 2"  (1.575 m)  Wt 260 lb (117.935 kg)  BMI 47.54 kg/m2  SpO2 98%  LMP 08/22/2013 Physical Exam  Nursing note and vitals reviewed. Constitutional: She appears well-developed and well-nourished. She appears distressed.  HENT:  Head: Normocephalic and atraumatic.  Mouth/Throat: Oropharynx is clear and moist. No oropharyngeal exudate.  Eyes: Conjunctivae and EOM are normal. Pupils are equal, round, and reactive to light. Right eye exhibits no discharge. Left eye exhibits no discharge. No scleral icterus.  Neck: Normal range of motion. Neck supple. No JVD present. No thyromegaly present.  Cardiovascular: Regular rhythm, normal heart sounds and intact distal pulses.  Exam reveals no gallop and no friction rub.   No murmur heard. Tachycardia at 120 beats per minute, strong pulses at the radial arteries, no JVD, no pitting edema  Pulmonary/Chest: She is in respiratory distress. She has rales.  Differential use inspiratory rales and rhonchorous sounds throughout the chest, significant respiratory distress, speaking in one to 2 word sentences, using accessory muscles  Abdominal: Soft. Bowel sounds are normal. She exhibits no distension and no mass. There is no  tenderness.  Musculoskeletal: Normal range of motion. She exhibits no edema and no tenderness.  No edema though there is a subtle asymmetry left greater than right lower extremities  Lymphadenopathy:    She has no cervical adenopathy.  Neurological: She is alert. Coordination normal.  Skin: Skin is warm and dry. No rash noted. No erythema.  Psychiatric: She has a normal mood and affect. Her behavior is normal.    ED Course   Procedures (including critical care time) Labs Review Labs Reviewed  PRO B NATRIURETIC PEPTIDE - Abnormal; Notable for the following:    Pro B Natriuretic peptide (BNP) 260.0 (*)    All other components within normal limits  BASIC METABOLIC PANEL - Abnormal; Notable for the following:    Potassium 3.6 (*)    Glucose, Bld 118 (*)    GFR calc non Af Amer 83 (*)    All other components within normal limits  CBC WITH DIFFERENTIAL - Abnormal; Notable for the following:    Hemoglobin 9.8 (*)    HCT 31.7 (*)    MCV 72.0 (*)    MCH 22.3 (*)    RDW 17.6 (*)    All other components within normal limits  POCT I-STAT, CHEM 8 - Abnormal; Notable for the following:    Glucose, Bld 117 (*)    Hemoglobin 11.9 (*)    HCT 35.0 (*)    All other components within normal limits  TROPONIN I  APTT  PROTIME-INR  POCT I-STAT TROPONIN I   Imaging Review Ct Angio Chest Pe W/cm &/or Wo Cm  09/12/2013   CLINICAL DATA:  Acute onset dyspnea  EXAM: CT ANGIOGRAPHY CHEST WITH CONTRAST  TECHNIQUE: Multidetector CT imaging of the chest was performed using the standard protocol during bolus administration of intravenous contrast. Multiplanar CT image reconstructions including MIPs were obtained to evaluate the vascular anatomy.  CONTRAST:  80mL OMNIPAQUE IOHEXOL 350 MG/ML SOLN  COMPARISON:  09/12/2013 chest radiograph  FINDINGS: The bolus timing is not optimized to evaluate the pulmonary arteries and the study is further degraded by respiratory motion. Allowing for this, no central pulmonary embolism. The distal segmental and subsegmental branches are nondiagnostic.  The thyroid gland appears enlarged.  Normal caliber aorta.  Cardiac enlargement.  No pleural or pericardial effusion.  No intrathoracic lymphadenopathy.  Upper abdominal images show nothing acute.  Central airways are patent.  No pneumothorax.  Bilateral areas of ground-glass opacity and mosaic attenuation.  No acute osseous finding.  Review of the MIP  images confirms the above findings.  IMPRESSION: Suboptimal contrast bolus timing. No central pulmonary embolism. The distal segmental and subsegmental branches are nondiagnostic.  Bilateral areas of ground-glass opacity may reflect interstitial pneumonia or edema.  Areas of mosaic attenuation may reflect air trapping/small airways disease.  Mild cardiomegaly.  Thyromegaly.   Electronically Signed   By: Jearld LeschAndrew  DelGaizo M.D.   On: 09/12/2013 03:16   Dg Chest Port 1 View  09/12/2013   CLINICAL DATA:  Chest pain, shortness of breath  EXAM: PORTABLE CHEST - 1 VIEW  COMPARISON:  None.  FINDINGS: Prominent heart size but normal vascularity. No edema, pneumonia, collapse or consolidation. No effusion or pneumothorax. Trachea midline.  IMPRESSION: No active disease.   Electronically Signed   By: Ruel Favorsrevor  Shick M.D.   On: 09/12/2013 00:55    EKG Interpretation    Date/Time:  Thursday September 12 2013 00:07:08 EST Ventricular Rate:  107 PR Interval:  135 QRS Duration: 87 QT Interval:  367  QTC Calculation: 490 R Axis:   70 Text Interpretation:  Sinus tachycardia Borderline prolonged QT interval Abnormal ekg No old tracing to compare Confirmed by Terrion Poblano  MD, Chia Mowers ([id number]) on 09/12/2013 12:17:34 AM            MDM   1. Respiratory distress    The patient is in acute respiratory distress, I suspect this is likely related to flash pulmonary edema, she is very hypertensive on the paramedics arrival with a systolic of 200, she does not take antihypertensives. She will be placed on cardiac monitor, CPAP, EKG, chest x-ray, labs, we'll monitor for decompensation and intubate as needed. The patient has given a green light to do this if needed. She denies any risk factors for pulmonary embolism except for her chronic left leg swelling. There has been no travel, no trauma, no immobilization, no smoking, no hormone use.  The patient has improved significantly over the last 2 hours. The CT scan of the chest shows  groundglass opacities that could be consistent with pulmonary edema or atypical infection. Given the patient's history with acute onset of dyspnea and pink frothy sputum I suspect that a cardiogenic type pulmonary edema is a more likely answer.  She is still mildly tachycardic, her oxygenation is 95% on 2 L, I have informed of her results and discussed this with the hospitalist who will admit to the hospital for further workup.  Vida Roller, MD 09/12/13 [phone]

## 2013-09-12 NOTE — H&P (Signed)
[facility] History and Physical  Patient: Paula Wolfe  OIP:[id number]  DOB: [birth year]  DOS: the patient was seen and examined on 09/12/2013 PCP: Domingo Sep, MD  Chief Complaint: Shortness of breath  HPI: Kai Kennon is a 28 y.o. female with Past medical history of hypothyroidism with thyroid nodules. The patient is coming from home. The patient mentions that she was at her baseline until this afternoon. She was watching TV and lying on the couch and suddenly started having complaints of shortness of breath that she couldn't take a deep breath and she had some chest tightness with that. She tried to relax but her breathing was progressively worsening and therefore they called the EMS. EMS place the patient on CPAP give her some albuterol nebulizer and Solu-Medrol. She had one episode of vomiting treated with Zofran before arrival. In the ED she was continued on BiPAP and was given nebulizer treatment. At the time of my evaluation she was on room air. The patient continued to complain of some shortness of breath, she felt palpitation as well. She denies any orthopnea at her baseline. She has chronic left leg swelling which is present since many years. She thinks that she might have aspirated on her saliva when she was an aching and having shortness of breath. She had some pink frothy sputum after this episode. She mentions that her significant other has some upper respiratory symptoms. She denies smoking, exposure to fumes, recent travel, recent immobilization, prior cardiac history, prior pulmonary history, significant family history. Of note she mentions that she has cold intolerance, weight loss of 10 pounds, and her thyroid was last checked in October.  Review of Systems: as mentioned in the history of present illness.  A Comprehensive review of the other systems is negative.  Past Medical History  Diagnosis Date  . Hypothyroidism    History reviewed. No  pertinent past surgical history. Social History:  reports that she has never smoked. She does not have any smokeless tobacco history on file. She reports that she drinks alcohol. She reports that she does not use illicit drugs. Independent for most of her  ADL.  No Known Allergies  No family history on file.  Prior to Admission medications   Medication Sig Start Date End Date Taking? Authorizing Provider  levothyroxine (SYNTHROID, LEVOTHROID) 75 MCG tablet Take 75 mcg by mouth daily before breakfast.   Yes Historical Provider, MD    Physical Exam: Filed Vitals:   09/12/13 0005 09/12/13 0100 09/12/13 0320 09/12/13 0434  BP: 158/98 131/81 138/88 126/78  Pulse: 111 100 104 102  Temp: 98.8 F (37.1 C)     TempSrc: Oral     Resp: 18 19 14 16   Height: 5\' 2"  (1.575 m)     Weight: 117.935 kg (260 lb)     SpO2: 100% 100% 98% 100%    General: Alert, Awake and Oriented to Time, Place and Person. Appear in marked distress Eyes: PERRL ENT: Oral Mucosa clear moist. Neck: Mild JVD Cardiovascular: S1 and S2 Present, aortic systolic Murmur, Peripheral Pulses Present Respiratory: Bilateral Air entry equal and Decreased, basal Crackles, no wheezes Abdomen: Bowel Sound Present, Soft and Non tender Skin: No Rash Extremities: Trace Pedal edema, no calf tenderness Neurologic: Grossly Unremarkable. Labs on Admission:  CBC:  Recent Labs Lab 09/12/13 0013 09/12/13 0020  WBC 6.8  --   NEUTROABS 4.1  --   HGB 9.8* 11.9*  HCT 31.7* 35.0*  MCV 72.0*  --   PLT  286  --     CMP     Component Value Date/Time   NA 142 09/12/2013 0020   K 3.7 09/12/2013 0020   CL 107 09/12/2013 0020   CO2 20 09/12/2013 0013   GLUCOSE 117* 09/12/2013 0020   BUN 13 09/12/2013 0020   CREATININE 1.00 09/12/2013 0020   CALCIUM 8.7 09/12/2013 0013   GFRNONAA 83* 09/12/2013 0013   GFRAA >90 09/12/2013 0013    No results found for this basename: LIPASE, AMYLASE,  in the last 168 hours No results found for this  basename: AMMONIA,  in the last 168 hours   Recent Labs Lab 09/12/13 0013  TROPONINI <0.30   BNP (last 3 results)  Recent Labs  09/12/13 0013  PROBNP 260.0*    Radiological Exams on Admission: Ct Angio Chest Pe W/cm &/or Wo Cm  09/12/2013   CLINICAL DATA:  Acute onset dyspnea  EXAM: CT ANGIOGRAPHY CHEST WITH CONTRAST  TECHNIQUE: Multidetector CT imaging of the chest was performed using the standard protocol during bolus administration of intravenous contrast. Multiplanar CT image reconstructions including MIPs were obtained to evaluate the vascular anatomy.  CONTRAST:  80mL OMNIPAQUE IOHEXOL 350 MG/ML SOLN  COMPARISON:  09/12/2013 chest radiograph  FINDINGS: The bolus timing is not optimized to evaluate the pulmonary arteries and the study is further degraded by respiratory motion. Allowing for this, no central pulmonary embolism. The distal segmental and subsegmental branches are nondiagnostic.  The thyroid gland appears enlarged.  Normal caliber aorta.  Cardiac enlargement.  No pleural or pericardial effusion.  No intrathoracic lymphadenopathy.  Upper abdominal images show nothing acute.  Central airways are patent.  No pneumothorax.  Bilateral areas of ground-glass opacity and mosaic attenuation.  No acute osseous finding.  Review of the MIP images confirms the above findings.  IMPRESSION: Suboptimal contrast bolus timing. No central pulmonary embolism. The distal segmental and subsegmental branches are nondiagnostic.  Bilateral areas of ground-glass opacity may reflect interstitial pneumonia or edema.  Areas of mosaic attenuation may reflect air trapping/small airways disease.  Mild cardiomegaly.  Thyromegaly.   Electronically Signed   By: Jearld LeschAndrew  DelGaizo M.D.   On: 09/12/2013 03:16   Dg Chest Port 1 View  09/12/2013   CLINICAL DATA:  Chest pain, shortness of breath  EXAM: PORTABLE CHEST - 1 VIEW  COMPARISON:  None.  FINDINGS: Prominent heart size but normal vascularity. No edema,  pneumonia, collapse or consolidation. No effusion or pneumothorax. Trachea midline.  IMPRESSION: No active disease.   Electronically Signed   By: Ruel Favorsrevor  Shick M.D.   On: 09/12/2013 00:55    EKG: Independently reviewed. sinus tachycardia.  Assessment/Plan Principal Problem:   Acute CHF (congestive heart failure)   1. Acute CHF (congestive heart failure) The patient is presenting with complaints of shortness of breath which was sudden in onset. She has undergone CT imaging chest which was negative for pulmonary embolism. But it does show presence of heart failure with pulmonary edema as well as possible air trapping. At present she is not in significant respiratory distress and on room air but continues to remain tachycardic. Possible etiology of her acute heart failure could be flash pulmonary edema secondary to high blood pressure which improved on its own versus iatrogenic hyperthyroidism. I would check her TSH and free T4, I will check serial troponins I will get echocardiogram, She will be given Lopressor and aspirin as well as 1 dose of Lasix. I will check influenza PCR and give her nebulizers as needed.  DVT Prophylaxis: subcutaneous Heparin Nutrition: Cardiac diet  Code Status: Full  Family Communication:  Significant other was present at bedside, opportunity was given to ask question and all questions were answered satisfactorily at the time of interview. Disposition: Admitted to inpatient in telemetry unit.  Author: Lynden Oxford, MD [facility] Pager: [phone] 09/12/2013, 5:05 AM    If 7PM-7AM, please contact night-coverage [email or web address] Password [email or web address]

## 2013-09-13 ENCOUNTER — Encounter (HOSPITAL_COMMUNITY)
Admission: EM | Disposition: A | Discharge status: Home / Self Care | Payer: Self-pay | Source: Home / Self Care | Attending: Internal Medicine

## 2013-09-13 ENCOUNTER — Encounter (HOSPITAL_COMMUNITY): Payer: Self-pay | Admitting: Thoracic Surgery (Cardiothoracic Vascular Surgery)

## 2013-09-13 ENCOUNTER — Other Ambulatory Visit: Payer: Self-pay | Admitting: *Deleted

## 2013-09-13 DIAGNOSIS — I272 Pulmonary hypertension, unspecified: Secondary | ICD-10-CM

## 2013-09-13 DIAGNOSIS — R079 Chest pain, unspecified: Secondary | ICD-10-CM

## 2013-09-13 DIAGNOSIS — I059 Rheumatic mitral valve disease, unspecified: Secondary | ICD-10-CM

## 2013-09-13 DIAGNOSIS — E669 Obesity, unspecified: Secondary | ICD-10-CM | POA: Diagnosis present

## 2013-09-13 DIAGNOSIS — I5043 Acute on chronic combined systolic (congestive) and diastolic (congestive) heart failure: Principal | ICD-10-CM | POA: Diagnosis present

## 2013-09-13 DIAGNOSIS — E042 Nontoxic multinodular goiter: Secondary | ICD-10-CM | POA: Insufficient documentation

## 2013-09-13 DIAGNOSIS — E039 Hypothyroidism, unspecified: Secondary | ICD-10-CM | POA: Diagnosis present

## 2013-09-13 DIAGNOSIS — D509 Iron deficiency anemia, unspecified: Secondary | ICD-10-CM

## 2013-09-13 HISTORY — DX: Pulmonary hypertension, unspecified: I27.20

## 2013-09-13 HISTORY — PX: LEFT AND RIGHT HEART CATHETERIZATION WITH CORONARY ANGIOGRAM: SHX5449

## 2013-09-13 LAB — BASIC METABOLIC PANEL
BUN: 16 mg/dL (ref 6–23)
CO2: 22 meq/L (ref 19–32)
Calcium: 8.6 mg/dL (ref 8.4–10.5)
Chloride: 106 mEq/L (ref 96–112)
Creatinine, Ser: 0.95 mg/dL (ref 0.50–1.10)
GFR calc Af Amer: >90 mL/min (ref >90)
GFR calc non Af Amer: 82 mL/min — ABNORMAL LOW (ref >90)
GLUCOSE: 91 mg/dL (ref 70–99)
Potassium: 4.2 mEq/L (ref 3.7–5.3)
SODIUM: 142 meq/L (ref 137–147)

## 2013-09-13 LAB — POCT I-STAT 3, VENOUS BLOOD GAS (G3P V)
Acid-base deficit: 1 mmol/L (ref 0.0–2.0)
Acid-base deficit: 1 mmol/L (ref 0.0–2.0)
Bicarbonate: 24.7 mEq/L — ABNORMAL HIGH (ref 20.0–24.0)
Bicarbonate: 25.1 mEq/L — ABNORMAL HIGH (ref 20.0–24.0)
O2 Saturation: 59 %
O2 Saturation: 60 %
TCO2: 27 mmol/L (ref 0–100)
TCO2: 26 mmol/L (ref 0–100)
pCO2, Ven: 46 mmHg (ref 45.0–50.0)
pCO2, Ven: 48.8 mmHg (ref 45.0–50.0)
pH, Ven: 7.319 — ABNORMAL HIGH (ref 7.250–7.300)
pH, Ven: 7.338 — ABNORMAL HIGH (ref 7.250–7.300)
pO2, Ven: 33 mmHg (ref 30.0–45.0)
pO2, Ven: 33 mmHg (ref 30.0–45.0)

## 2013-09-13 LAB — CBC
HEMATOCRIT: 28.6 % — AB (ref 36.0–46.0)
Hemoglobin: 8.9 g/dL — ABNORMAL LOW (ref 12.0–15.0)
MCH: 22.5 pg — AB (ref 26.0–34.0)
MCHC: 31.1 g/dL (ref 30.0–36.0)
MCV: 72.4 fL — AB (ref 78.0–100.0)
PLATELETS: 262 10*3/uL (ref 150–400)
RBC: 3.95 MIL/uL (ref 3.87–5.11)
RDW: 17.7 % — ABNORMAL HIGH (ref 11.5–15.5)
WBC: 7.6 10*3/uL (ref 4.0–10.5)

## 2013-09-13 LAB — POCT I-STAT 3, ART BLOOD GAS (G3+)
ACID-BASE DEFICIT: 1 mmol/L (ref 0.0–2.0)
Bicarbonate: 23.4 mEq/L (ref 20.0–24.0)
O2 Saturation: 99 %
TCO2: 25 mmol/L (ref 0–100)
pCO2 arterial: 38.1 mmHg (ref 35.0–45.0)
pH, Arterial: 7.396 (ref 7.350–7.450)
pO2, Arterial: 138 mmHg — ABNORMAL HIGH (ref 80.0–100.0)

## 2013-09-13 LAB — POCT ACTIVATED CLOTTING TIME: Activated Clotting Time: 166 seconds

## 2013-09-13 SURGERY — LEFT AND RIGHT HEART CATHETERIZATION WITH CORONARY ANGIOGRAM
Anesthesia: LOCAL

## 2013-09-13 SURGERY — ECHOCARDIOGRAM, TRANSESOPHAGEAL
Anesthesia: Moderate Sedation

## 2013-09-13 MED ORDER — NITROGLYCERIN 0.2 MG/ML ON CALL CATH LAB
INTRAVENOUS | Status: AC
Start: 2013-09-13 — End: 2013-09-13
  Filled 2013-09-13: qty 1

## 2013-09-13 MED ORDER — POTASSIUM CHLORIDE CRYS ER 20 MEQ PO TBCR
20.0000 meq | EXTENDED_RELEASE_TABLET | Freq: Two times a day (BID) | ORAL | Status: AC
  Administered 2013-09-13 (×2): 20 meq via ORAL
  Filled 2013-09-13 (×2): qty 1

## 2013-09-13 MED ORDER — FUROSEMIDE 10 MG/ML IJ SOLN
INTRAMUSCULAR | Status: AC
  Filled 2013-09-13: qty 4

## 2013-09-13 MED ORDER — SODIUM CHLORIDE 0.9 % IV SOLN: INTRAVENOUS | Status: AC

## 2013-09-13 MED ORDER — ZOLPIDEM TARTRATE 5 MG PO TABS: 5.0000 mg | ORAL_TABLET | Freq: Every evening | ORAL | Status: DC | PRN

## 2013-09-13 MED ORDER — MIDAZOLAM HCL 2 MG/2ML IJ SOLN
INTRAMUSCULAR | Status: AC
  Filled 2013-09-13: qty 2

## 2013-09-13 MED ORDER — HEPARIN (PORCINE) IN NACL 2-0.9 UNIT/ML-% IJ SOLN
INTRAMUSCULAR | Status: AC
  Filled 2013-09-13: qty 1500

## 2013-09-13 MED ORDER — FENTANYL CITRATE 0.05 MG/ML IJ SOLN
INTRAMUSCULAR | Status: AC
  Filled 2013-09-13: qty 2

## 2013-09-13 MED ORDER — MIDAZOLAM HCL 2 MG/2ML IJ SOLN
INTRAMUSCULAR | Status: AC
Start: 2013-09-13 — End: 2013-09-13
  Filled 2013-09-13: qty 2

## 2013-09-13 MED ORDER — LIDOCAINE HCL (PF) 1 % IJ SOLN
INTRAMUSCULAR | Status: AC
  Filled 2013-09-13: qty 30

## 2013-09-13 MED ORDER — VERAPAMIL HCL 2.5 MG/ML IV SOLN
INTRAVENOUS | Status: AC
  Filled 2013-09-13: qty 2

## 2013-09-13 MED ORDER — HEPARIN SODIUM (PORCINE) 1000 UNIT/ML IJ SOLN
INTRAMUSCULAR | Status: AC
Start: 2013-09-13 — End: 2013-09-13
  Filled 2013-09-13: qty 1

## 2013-09-13 MED ORDER — ACETAMINOPHEN 325 MG PO TABS: 650.0000 mg | ORAL_TABLET | ORAL | Status: DC | PRN

## 2013-09-13 NOTE — Op Note (Signed)
Preliminary report  Very mild prolapse of the anterior mitral leaflet.  Posteriler leaflet is shorter, appears almost tethered.  There is severe MR directed more posteriorly into LA that tracks around the back of the chamber.  LVEF is mildly depressed at around 50%.  Full report to follow.

## 2013-09-13 NOTE — Progress Notes (Addendum)
[facility] PROGRESS NOTE  Paula AblesShatina Renee Wolfe WGN:[id number] DOB: 05-07-1986 DOA: 09/11/2013 PCP: [name], [name], MD  Assessment/Plan: SOB -Resolved. -Probably related to flash-pulmonary edema associated to severe MR. -CT Angio with ground-glass opacities ?PNA vs edema. -ECHO reviewed: preserved EF, severe MR. -Will DC levaquin.   Severe MR -For TEE and cath today. -Will likely need CVTS consult for MV repair.  Iron Deficiency Anemia -Hb 8.9 with a ferritin of 6. -Likely related to menstrual losses. -Start PO FeSo4. -No evidence of GI bleed per history.  Hypothyroidism -TSH WNL. -Continue home dose of synthroid.  Code Status: Full Code Family Communication: Multiple family members at bedside, including parents and sister updated on plan of care. Disposition Plan: Home when medically ready.   Consultants:  Cardiology   Antibiotics:  None  Subjective: No complaints. SOB has resolved.  Objective: Filed Vitals:   09/12/13 1215 09/12/13 2028 09/13/13 0521 09/13/13 1014  BP: 136/78 149/79 128/82 139/95  Pulse: 101 75 82 77  Temp: 97.6 F (36.4 C)  98 F (36.7 C)   TempSrc: Oral  Oral   Resp: 17 18 18    Height:      Weight:   119.024 kg (262 lb 6.4 oz)   SpO2: 100% 100% 98%     Intake/Output Summary (Last 24 hours) at 09/13/13 1426 Last data filed at 09/12/13 1847  Gross per 24 hour  Intake    320 ml  Output      0 ml  Net    320 ml   Filed Weights   09/12/13 0508 09/12/13 1100 09/13/13 0521  Weight: 119.614 kg (263 lb 11.2 oz) 120.249 kg (265 lb 1.6 oz) 119.024 kg (262 lb 6.4 oz)    Exam:   General:  AA Ox3, obese  Cardiovascular: RRR  Respiratory: CTA B  Abdomen: obese, S/NT/ND/+BS  Extremities: no C/C/E   Neurologic:  Intact, non-focal  Data Reviewed: Basic Metabolic Panel:  Recent Labs Lab 09/12/13 0013 09/12/13 0020 09/13/13 0450  NA 138 142 142  K 3.6* 3.7 4.2  CL 103 107 106  CO2 20  --  22  GLUCOSE 118* 117* 91   BUN 14 13 16   CREATININE 0.94 1.00 0.95  CALCIUM 8.7  --  8.6   Liver Function Tests: No results found for this basename: AST, ALT, ALKPHOS, BILITOT, PROT, ALBUMIN,  in the last 168 hours No results found for this basename: LIPASE, AMYLASE,  in the last 168 hours No results found for this basename: AMMONIA,  in the last 168 hours CBC:  Recent Labs Lab 09/12/13 0013 09/12/13 0020 09/13/13 0450  WBC 6.8  --  7.6  NEUTROABS 4.1  --   --   HGB 9.8* 11.9* 8.9*  HCT 31.7* 35.0* 28.6*  MCV 72.0*  --  72.4*  PLT 286  --  262   Cardiac Enzymes:  Recent Labs Lab 09/12/13 0013 09/12/13 0546 09/12/13 1026 09/12/13 1650  TROPONINI <0.30 <0.30 <0.30 <0.30   BNP (last 3 results)  Recent Labs  09/12/13 0013  PROBNP 260.0*   CBG: No results found for this basename: GLUCAP,  in the last 168 hours  No results found for this or any previous visit (from the past 240 hour(s)).   Studies: Ct Angio Chest Pe W/cm &/or Wo Cm  09/12/2013   CLINICAL DATA:  Acute onset dyspnea  EXAM: CT ANGIOGRAPHY CHEST WITH CONTRAST  TECHNIQUE: Multidetector CT imaging of the chest was performed using the standard protocol during bolus administration  of intravenous contrast. Multiplanar CT image reconstructions including MIPs were obtained to evaluate the vascular anatomy.  CONTRAST:  68mL OMNIPAQUE IOHEXOL 350 MG/ML SOLN  COMPARISON:  09/12/2013 chest radiograph  FINDINGS: The bolus timing is not optimized to evaluate the pulmonary arteries and the study is further degraded by respiratory motion. Allowing for this, no central pulmonary embolism. The distal segmental and subsegmental branches are nondiagnostic.  The thyroid gland appears enlarged.  Normal caliber aorta.  Cardiac enlargement.  No pleural or pericardial effusion.  No intrathoracic lymphadenopathy.  Upper abdominal images show nothing acute.  Central airways are patent.  No pneumothorax.  Bilateral areas of ground-glass opacity and mosaic  attenuation.  No acute osseous finding.  Review of the MIP images confirms the above findings.  IMPRESSION: Suboptimal contrast bolus timing. No central pulmonary embolism. The distal segmental and subsegmental branches are nondiagnostic.  Bilateral areas of ground-glass opacity may reflect interstitial pneumonia or edema.  Areas of mosaic attenuation may reflect air trapping/small airways disease.  Mild cardiomegaly.  Thyromegaly.   Electronically Signed   By: Jearld Lesch M.D.   On: 09/12/2013 03:16   Dg Chest Port 1 View  09/12/2013   CLINICAL DATA:  Chest pain, shortness of breath  EXAM: PORTABLE CHEST - 1 VIEW  COMPARISON:  None.  FINDINGS: Prominent heart size but normal vascularity. No edema, pneumonia, collapse or consolidation. No effusion or pneumothorax. Trachea midline.  IMPRESSION: No active disease.   Electronically Signed   By: Ruel Favors M.D.   On: 09/12/2013 00:55    Scheduled Meds: . aspirin EC  81 mg Oral Daily  . enoxaparin (LOVENOX) injection  40 mg Subcutaneous Q24H  . ferrous sulfate  325 mg Oral BID WC  . influenza vac split quadrivalent PF  0.5 mL Intramuscular Tomorrow-1000  . levothyroxine  75 mcg Oral QAC breakfast  . metoprolol tartrate  25 mg Oral BID  . potassium chloride  20 mEq Oral BID   Continuous Infusions:   Principal Problem:   SOB (shortness of breath) Active Problems:   Atypical pneumonia   Mitral regurgitation    Time spent: 25 minutes. Greater than 50% of this time was spent in direct contact with the patient coordinating care.    Chaya Jan  [facility] Pager [phone]  If 7PM-7AM, please contact night-coverage at [email or web address], password [facility] 09/13/2013, 2:26 PM  LOS: 2 days

## 2013-09-13 NOTE — Progress Notes (Signed)
     SUBJECTIVE: Mild chest pressure, dyspnea this am.   BP 128/82  Pulse 82  Temp(Src) 98 F (36.7 C) (Oral)  Resp 18  Ht 5\' 2"  (1.575 m)  Wt 262 lb 6.4 oz (119.024 kg)  BMI 47.98 kg/m2  SpO2 98%  LMP 08/22/2013  Intake/Output Summary (Last 24 hours) at 09/13/13 0920 Last data filed at 09/12/13 1847  Gross per 24 hour  Intake    600 ml  Output    550 ml  Net     50 ml    PHYSICAL EXAM General: Well developed, well nourished, in no acute distress. Alert and oriented x 3.  Psych:  Good affect, responds appropriately Neck: No JVD. No masses noted.  Lungs: Clear bilaterally with no wheezes or rhonci noted.  Heart: RRR with systolic murmur noted. Abdomen: Bowel sounds are present. Soft, non-tender.  Extremities: No lower extremity edema.   LABS: Basic Metabolic Panel:  Recent Labs  93/90/30 0013 09/12/13 0020 09/13/13 0450  NA 138 142 142  K 3.6* 3.7 4.2  CL 103 107 106  CO2 20  --  22  GLUCOSE 118* 117* 91  BUN 14 13 16   CREATININE 0.94 1.00 0.95  CALCIUM 8.7  --  8.6   CBC:  Recent Labs  09/12/13 0013 09/12/13 0020 09/13/13 0450  WBC 6.8  --  7.6  NEUTROABS 4.1  --   --   HGB 9.8* 11.9* 8.9*  HCT 31.7* 35.0* 28.6*  MCV 72.0*  --  72.4*  PLT 286  --  262   Cardiac Enzymes:  Recent Labs  09/12/13 0546 09/12/13 1026 09/12/13 1650  TROPONINI <0.30 <0.30 <0.30   Current Meds: . aspirin EC  81 mg Oral Daily  . enoxaparin (LOVENOX) injection  40 mg Subcutaneous Q24H  . ferrous sulfate  325 mg Oral BID WC  . influenza vac split quadrivalent PF  0.5 mL Intramuscular Tomorrow-1000  . levothyroxine  75 mcg Oral QAC breakfast  . metoprolol tartrate  25 mg Oral BID    ASSESSMENT AND PLAN:  1. Severe mitral regurgitation:  Pt admitted with SOB, CHF. She has been found to have severe MR. She was evaluated by Dr. Eden Emms on 09/12/13. Plans for TEE in the cath lab this am followed by right and left heart cath. Further plans to follow after TEE and cath.      Paula Wolfe  1/30/20159:20 AM

## 2013-09-13 NOTE — Interval H&P Note (Signed)
History and Physical Interval Note:  09/13/2013 11:19 AM  Paula Wolfe  has presented today for surgery, with the diagnosis of Chest pain  The various methods of treatment have been discussed with the patient and family. After consideration of risks, benefits and other options for treatment, the patient has consented to  Procedure(s): LEFT AND RIGHT HEART CATHETERIZATION WITH CORONARY ANGIOGRAM (N/A) as a surgical intervention .  The patient's history has been reviewed, patient examined, no change in status, stable for surgery.  I have reviewed the patient's chart and labs.  Questions were answered to the patient's satisfaction.     Dietrich Pates

## 2013-09-13 NOTE — Progress Notes (Signed)
  Echocardiogram Echocardiogram Transesophageal (in cath lab) has been performed.  Paula Wolfe 09/13/2013, 12:09 PM

## 2013-09-13 NOTE — Consult Note (Addendum)
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 The patient and her parents state that she was told she had a heart murmur and leaky heart valve during childhood. There is no history of rheumatic fever. She was never formally evaluated by a cardiologist. The patient states that for several months she has experienced vague tightness across her chest with exertion and worsening orthopnea. She developed somewhat sudden onset of resting shortness of breath prompting presentation to the emergency department 09/12/2013.  She tried to relax but her breathing got worse and became associated with cough producing pink, frothy secretions, ultimately causing her to call EMS.  In the ED she underwent CT angiogram of the chest which ruled out pulmonary embolus but was notable for the presence of interstitial pulmonary edema. A transthoracic echocardiogram was performed demonstrating severe mitral regurgitation with mild left ventricular systolic dysfunction. Cardiology was consulted.  She subsequently underwent transesophageal echocardiogram earlier today which confirmed the presence of severe mitral regurgitation and mild left ventricular dysfunction. Left and right heart catheterization was performed revealing normal coronary artery anatomy with no significant coronary artery disease.  There was moderate to severe pulmonary hypertension with large V waves seen on pulmonary capillary wedge tracing.  Cardiothoracic surgical consultation was requested.  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  Her parents and family live in [place].  She has been overweight most of her life. She has lost nearly 30 pounds in weight over the past 6 months using a diet and exercise program intended to get in better shape.  She states that in retrospect she's been having some tightness across her chest with exertion off and on for a long time. The tightness and shortness of breath are made worse by lying flat in bed, and she has had several episodes of PND in recent months. She has had frequent tachypalpitations without disease spells or syncope. She's had lower extremity edema, worse on the left than the right.  Past Medical History  Diagnosis Date  . Hypothyroidism   . Nontoxic multinodular goiter     a. Pt reports prior ultrasound/bx that were negative but was told she would need to keep an eye on them in the future. Benign FNA per [facility] for this dx.  . Menometrorrhagia     a. Improved after tx of hypothyroidism, but recurred 05/2013.  Marland Kitchen Heart valve problem     a. Pt states she was told this as a child.  . Anemia     a. Hgb 9.6 in 05/2013.  . Broken foot     a. Remote broken foot with residual chronic LLE edema.  . Acute on chronic combined systolic and diastolic heart failure 09/12/2013  . Mitral regurgitation 09/12/2013  . Multinodular goiter   . Pulmonary hypertension 09/13/2013  . Obesity     Past Surgical History  Procedure Laterality Date  . External ear surgery      Family History  Problem Relation Age of Onset  .  Hypertension Mother   . Hypertension Sister   . Hypertension Father   . Heart Problems Maternal Grandfather     details unclear    History   Social History  . Marital Status: Single    Spouse Name: N/A    Number of Children: N/A  . Years of Education: N/A   Occupational History  .      Teaching   Social History Main Topics  . Smoking status: Never Smoker   . Smokeless tobacco: Never Used  . Alcohol Use: Yes      Comment: occasionally - 1-2x/month  . Drug Use: No  . Sexual Activity: Yes    Birth Control/ Protection: None   Other Topics Concern  . Not on file   Social History Narrative  . No narrative on file    Prior to Admission medications   Medication Sig Start Date End Date Taking? Authorizing Provider  levothyroxine (SYNTHROID, LEVOTHROID) 75 MCG tablet Take 75 mcg by mouth daily before breakfast.   Yes Historical Provider, MD  levofloxacin (LEVAQUIN) 750 MG tablet Take 1 tablet (750 mg total) by mouth daily. For 8 days 09/12/13   Henderson CloudEstela Y Hernandez Acosta, MD    Current Facility-Administered Medications  Medication Dose Route Frequency Provider Last Rate Last Dose  . 0.9 %  sodium chloride infusion   Intravenous Continuous Lyn RecordsHenry W Smith III, MD      . acetaminophen (TYLENOL) tablet 650 mg  650 mg Oral Q4H PRN Lynden OxfordPranav Patel, MD   650 mg at 09/13/13 [id number]  . aspirin EC tablet 81 mg  81 mg Oral Daily Dayna N Dunn, PA-C      . enoxaparin (LOVENOX) injection 40 mg  40 mg Subcutaneous Q24H Lynden OxfordPranav Patel, MD   40 mg at 09/13/13 1023  . ferrous sulfate tablet 325 mg  325 mg Oral BID WC Lynden OxfordPranav Patel, MD   325 mg at 09/13/13 0839  . influenza vac split quadrivalent PF (FLUARIX) injection 0.5 mL  0.5 mL Intramuscular Tomorrow-1000 Estela Isaiah BlakesY Hernandez Acosta, MD      . ipratropium-albuterol (DUONEB) 0.5-2.5 (3) MG/3ML nebulizer solution 3 mL  3 mL Nebulization Q4H PRN Lynden OxfordPranav Patel, MD      . levothyroxine (SYNTHROID, LEVOTHROID) tablet 75 mcg  75 mcg Oral QAC breakfast Lynden OxfordPranav Patel, MD   75 mcg at 09/13/13 0839  . metoprolol tartrate (LOPRESSOR) tablet 25 mg  25 mg Oral BID Lynden OxfordPranav Patel, MD   25 mg at 09/13/13 1015  . ondansetron (ZOFRAN) injection 4 mg  4 mg Intravenous Q6H PRN Lynden OxfordPranav Patel, MD   4 mg at 09/12/13 1956  . potassium chloride SA (K-DUR,KLOR-CON) CR tablet 20 mEq  20 mEq Oral BID Lyn RecordsHenry W Smith III, MD      . zolpidem [facility](AMBIEN) tablet 5 mg  5 mg Oral QHS PRN Henderson CloudEstela Y Hernandez Acosta, MD          No Known Allergies    Review of Systems:   General:  normal appetite, normal energy, no weight gain, + weight loss, no fever  Cardiac:  + chest pain with exertion, + chest pain at rest, + SOB with exertion, + resting SOB, + PND, + orthopnea, + palpitations, no arrhythmia, no atrial fibrillation, + LE edema, no dizzy spells, no syncope  Respiratory:  + shortness of breath, no home oxygen, no productive cough, + dry cough, no bronchitis, no wheezing, no hemoptysis, no asthma, no pain with inspiration or cough, no sleep apnea, no CPAP at  night  GI:   no difficulty swallowing, no reflux, no frequent heartburn, no hiatal hernia, no abdominal pain, no constipation, no diarrhea, no hematochezia, no hematemesis, no melena  GU:   no dysuria,  no frequency, no urinary tract infection, no hematuria, no kidney stones, no kidney disease  Vascular:  no pain suggestive of claudication, no pain in feet, no leg cramps, no varicose veins, no DVT, no non-healing foot ulcer  Neuro:   no stroke, no TIA's, no seizures, no headaches, no temporary blindness one eye,  no slurred speech, no peripheral neuropathy, no chronic pain, no instability of gait, no memory/cognitive dysfunction  Musculoskeletal: no arthritis, no joint swelling, no myalgias, no difficulty walking, normal mobility   Skin:   no rash, no itching, no skin infections, no pressure sores or ulcerations  Psych:   no anxiety, no depression, no nervousness, no unusual recent stress  Eyes:   no blurry vision, no floaters, no recent vision changes, does not wears glasses or contacts  ENT:   no hearing loss, no loose or painful teeth, no dentures, last saw dentist within the last year  Hematologic:  no easy bruising, no abnormal bleeding, no clotting disorder, no frequent epistaxis  Endocrine:  no diabetes, does not check CBG's at home     Physical Exam:   BP 139/95  Pulse 64  Temp(Src) 98 F (36.7 C) (Oral)  Resp 18  Ht 5\' 2"  (1.575 m)  Wt  119.024 kg (262 lb 6.4 oz)  BMI 47.98 kg/m2  SpO2 98%  LMP 08/22/2013  General:  Obese but o/w  well-appearing  HEENT:  Unremarkable   Neck:   no JVD, no bruits, no adenopathy   Chest:   clear to auscultation, symmetrical breath sounds, no wheezes, no rhonchi   CV:   RRR, grade III/VI holosystolic murmur   Abdomen:  soft, non-tender, no masses   Extremities:  warm, well-perfused, pulses palpable  Rectal/GU  Deferred  Neuro:   Grossly non-focal and symmetrical throughout  Skin:   Clean and dry, no rashes, no breakdown  Diagnostic Tests:  Transthoracic Echocardiography  Patient: Paula Wolfe, Paula Wolfe MR #: [id number] Study Date: 09/12/2013 Gender: F Age: 49 Height: 157.5cm Weight: 120.2kg BSA: 2.72m^2 Pt. Status: Room: OTFC  PERFORMING W. Viann Fish, MD [facility] SONOGRAPHER Cathie Beams ATTENDING Philip Aspen, Estela ADMITTING Lynden Oxford ORDERING Patel, Pranav REFERRING Lynden Oxford cc:  ------------------------------------------------------------ LV EF: 50% - 55%  ------------------------------------------------------------ Indications: CHF - 428.0.  ------------------------------------------------------------ History: PMH: Obesity. Atypical pneumonia. Respiratory distress. Dyspnea.  ------------------------------------------------------------ Study Conclusions  - Left ventricle: The cavity size was mildly dilated. Systolic function was normal. The estimated ejection fraction was in the range of 50% to 55%. Wall motion was normal; there were no regional wall motion abnormalities. - Mitral valve: Severe regurgitation. - Left atrium: The atrium was severely dilated. - Pulmonary arteries: Systolic pressure was mildly increased. PA peak pressure: 35mm Hg (S). Transthoracic echocardiography. M-mode, complete 2D, spectral Doppler, and color Doppler. Height: Height: 157.5cm. Height: 62in. Weight: Weight: 120.2kg. Weight: 264.4lb. Body mass index: BMI:  48.5kg/m^2. Body surface area: BSA: 2.76m^2. Blood pressure: 148/89. Patient status: Inpatient. Location: Echo laboratory.  ------------------------------------------------------------  ------------------------------------------------------------ Left ventricle: The cavity size was mildly dilated. Systolic function was normal. The estimated ejection fraction was in the range of 50% to 55%. Wall motion was normal; there were no regional wall motion abnormalities.  ------------------------------------------------------------ Aortic valve: Trileaflet; normal thickness leaflets. Mobility was not restricted. Doppler: Transvalvular velocity was within the normal range.  There was no stenosis. No regurgitation.  ------------------------------------------------------------ Aorta: Aortic root: The aortic root was normal in size.  ------------------------------------------------------------ Mitral valve: Structurally normal valve. Mobility was not restricted. Doppler: Transvalvular velocity was within the normal range. There was no evidence for stenosis. Severe regurgitation. Peak gradient: 57mm Hg (D).  ------------------------------------------------------------ Left atrium: The atrium was severely dilated.  ------------------------------------------------------------ Right ventricle: The cavity size was normal. Wall thickness was normal. Systolic function was normal.  ------------------------------------------------------------ Pulmonic valve: Not well visualized. The valve appears to be grossly normal. Doppler: Transvalvular velocity was within the normal range. There was no evidence for stenosis.  ------------------------------------------------------------ Tricuspid valve: Structurally normal valve. Doppler: Transvalvular velocity was within the normal range. Mild regurgitation.  ------------------------------------------------------------ Pulmonary artery: The main pulmonary  artery was normal-sized. Systolic pressure was mildly increased.  ------------------------------------------------------------ Right atrium: The atrium was normal in size.  ------------------------------------------------------------ Pericardium: There was no pericardial effusion.  ------------------------------------------------------------ Systemic veins: Inferior vena cava: Not well visualized.  ------------------------------------------------------------  2D measurements Normal Doppler measurements Normal Left ventricle Main pulmonary LVID ED, 58.4 mm 43-52 artery chord, Pressure, S 35 mm =30 PLAX Hg LVID ES, 46.8 mm 23-38 Left ventricle chord, Ea, lat ann, 13. cm/s ------ PLAX tiss DP 8 FS, chord, 20 % >29 E/Ea, lat 8.7 ------ PLAX ann, tiss DP 7 LVPW, ED 12.4 mm ------ Ea, med ann, 12. cm/s ------ IVS/LVPW 0.81 <1.3 tiss DP 9 ratio, ED E/Ea, med 9.3 ------ Ventricular septum ann, tiss DP 8 IVS, ED 10.1 mm ------ Mitral valve LVOT Peak E vel 121 cm/s ------ Diam, S 20 mm ------ Peak A vel 62. cm/s ------ Area 3.14 cm^2 ------ 1 Aorta Deceleration 158 ms 150-23 Root diam, 29 mm ------ time 0 ED Peak 6 mm ------ Left atrium gradient, D Hg AP dim 38 mm ------ Peak E/A 1.9 ------ AP dim 1.6 cm/m^2 <2.2 ratio index Regurg alias 39. cm/s ------ vel, PISA 7 Regurg 6 mm ------ radius, PISA Max regurg 560 cm/s ------ vel Regurg VTI 178 cm ------ ERO, PISA 0.1 cm^2 ------ 6 Regurg vol, 28 ml ------ PISA Tricuspid valve Regurg peak 281 cm/s ------ vel Peak RV-RA 32 mm ------ gradient, S Hg Max regurg 281 cm/s ------ vel Systemic veins Estimated CVP 3 mm ------ Hg Right ventricle Pressure, S 35 mm <30 Hg Sa vel, lat 13. cm/s ------ ann, tiss DP 2  ------------------------------------------------------------ Prepared and Electronically Authenticated by  Ellwood Handler 2015-01-29T13:15:31.140    Transesophageal Echocardiography Preliminary report    Very mild prolapse of the anterior mitral leaflet. Posteriler leaflet is shorter, appears almost tethered. There is severe MR directed more posteriorly into LA that tracks around the back of the chamber. LVEF is mildly depressed at around 50%. Full report to follow.    Left and Right Heart Catheterization with Coronary Angiography Report   Mersadie Higashi  28 y.o.  female  Nov 25, 1985  Procedure Date:09/13/2013  Referring Physician: Eden Emms, M.D.  Primary Cardiologist:: Burna Forts, MD  INDICATIONS: Severe mitral regurgitation  PROCEDURE: 1. Left heart catheterization; 2. Right heart catheterization; 3. Left ventriculography by power injection;4. Coronary angiography  CONSENT:  The risks, benefits, and details of the procedure were explained in detail to the patient. Risks including death, stroke, heart attack, kidney injury, allergy, limb ischemia, bleeding and radiation injury were discussed. The patient verbalized understanding and wanted to proceed. Informed written consent was obtained.  PROCEDURE TECHNIQUE: After Xylocaine anesthesia a 5 French Slender sheath was placed in the right radial artery after multiple passes  using an Environmental manager. Multiple passes with a straight needle failed to allow passage of a guidewire. A 22-gauge Angiocath was placed in the right antecubital vein. A micro-wire was then advanced into the brachial vein and a 5 French sheath was placed. Coronary angiography was done using a 5 [place] JR 4 and JL 3.5 cm catheters. Left ventriculography was done using the 5 French angled pigtail catheter and power injection. Right heart pressures were obtained using a 5 French balloon tip catheter. Right atrial, main pulmonary artery, and supravalvular aortic saturation samples were obtained  In the supine position, the patient began experiencing significant dyspnea and chest pressure post procedure. We sat her up on a wedge and gauge IV Lasix 40 mg with dramatic improvement in  the symptom over 10-15 minutes.  MEDICATIONS: 1 mg Versed IV and fentanyl 50 mcg  CONTRAST: Total of 130 cc.  COMPLICATIONS: Dyspnea and chest discomfort relieved with sitting the patient up and giving IV Lasix.  HEMODYNAMICS: Aortic pressure 127/99 mmHg; LV pressure 135/22 mmHg; LVEDP 32 mm mercury; RA 16 mm mercury, RV 52/14 mmHg, PA 53/26 mmHg, PCWP(mean) 32 mm mercury mean with a V wave to 52 mm mercury, Cardiac Output 6.06 L/minute with cardiac index of 2.82 L/minute/m2  ANGIOGRAPHIC DATA: The left main coronary artery is normal..  The left anterior descending artery is widely patent, dominant, wrapping around the left ventricular apex. Large diagonal branch arises proximally. No obstruction is noted..  The left circumflex artery is widely patent without evidence of coronary obstruction. 3 obtuse marginal branches arise with the first obtuse marginal being a large branching vessel...  The right coronary artery is dominant and widely patent. There is a large PDA and left ventricular branch. There is also a large acute marginal branch.Marland Kitchen  LEFT VENTRICULOGRAM: Left ventricular angiogram was done in the 30 RAO projection and revealed an enlarged LV, an estimated ejection fraction of 45%. There is mild global hypokinesis. There is moderately severe to severe MR. The left atrium appears to be significantly dilated.  IMPRESSIONS: 1. Moderately severe to severe mitral regurgitation with significant left atrial enlargement.  2. Low-normal to mildly depressed LV systolic function with LV enlargement and EF of 45 percent. Patient has acute on chronic combined systolic and diastolic heart failure.  3. Normal coronary arteries.  4. Moderately severe pulmonary hypertension  RECOMMENDATION: TCTS evaluation for consideration of mitral valve repair/replacement.    Impression:  Patient is a 28 year old morbidly obese African American female who presents with severe symptomatic mitral regurgitation associated  with acute exacerbation of likely chronic combined systolic and diastolic congestive heart failure.  I have personally reviewed the patient's transthoracic and transesophageal echocardiograms. For the most part there appears to be normal leaflet motion with type I dysfunction of the mitral valve, although there may be some significant restriction of a portion of the posterior leaflet (type IIIa). Overall I am still optimistic that her valve will be repairable.  Left ventricular function is mildly reduced and there is moderate to severe pulmonary hypertension.  Risks associated with surgery should still be fairly low given the patient's young age, although risks will be somewhat increased because of her morbid obesity.     Plan:  The patient and her family were counseled at length regarding the indications, risks and potential benefits of surgery.  Alternative treatment strategies were discussed.  The rationale for elective mitral valve repair surgery has been explained, including a comparison between surgery and continued medical therapy with close follow-up.  The likelihood of successful and durable valve repair has been discussed with particular reference to the findings of their recent echocardiogram.  Based upon these findings and previous experience, I have quoted them a greater than 90 percent likelihood of successful valve repair.  In the unlikely event that their valve cannot be successfully repaired, we discussed the possibility of replacing the mitral valve using a mechanical prosthesis with the attendant need for long-term anticoagulation versus the alternative of replacing it using a bioprosthetic tissue valve with its potential for late structural valve deterioration and failure, depending upon the patient's longevity.  The importance of this decision in a woman of childbearing years has been discussed.  Alternative surgical approaches have been discussed including a comparison between conventional  sternotomy and minimally-invasive techniques.  The patient's father asked about catheter-based treatment options, and this was discussed.  All questions have been addressed.  At this point in time the patient remains to some degree in his state of disbelief and she wants to think things over, but she seems to be agreeable in proceeding with elective surgery in the near future.  If the patient is discharged from the hospital over the weekend I will make arrangements for followup in the office next week. Otherwise I will plan to see her in followup in the hospital on Monday.  The patient's anemia needs to be evaluated further, as she will need to be anticoagulated during and after surgery.      [name], MD 09/13/2013 4:02 PM  I spent in excess of 90 minutes of time directly involved in the conduct of this consultation.

## 2013-09-13 NOTE — CV Procedure (Signed)
     Left and Right Heart Catheterization with Coronary Angiography Report  Paula Wolfe  28 y.o.  female Sep 18, 1985  Procedure Date:09/13/2013  Referring Physician: Eden Emms, M.D. Primary Cardiologist:: Burna Forts, MD  INDICATIONS: Severe mitral regurgitation  PROCEDURE: 1. Left heart catheterization; 2. Right heart catheterization; 3. Left ventriculography by power injection;4. Coronary angiography  CONSENT:  The risks, benefits, and details of the procedure were explained in detail to the patient. Risks including death, stroke, heart attack, kidney injury, allergy, limb ischemia, bleeding and radiation injury were discussed.  The patient verbalized understanding and wanted to proceed.  Informed written consent was obtained.  PROCEDURE TECHNIQUE:  After Xylocaine anesthesia a 5 French Slender sheath was placed in the right radial artery after multiple passes using an Angiocath set. Multiple passes with a straight needle failed to allow passage of a guidewire. A 22-gauge Angiocath was placed in the right antecubital vein. A micro-wire was then advanced into the brachial vein and a 5 French sheath was placed. Coronary angiography was done using a 5 [place] JR 4 and JL 3.5 cm catheters.  Left ventriculography was done using the 5 French angled pigtail catheter and power injection.  Right heart pressures were obtained using a 5 French balloon tip catheter. Right atrial, main pulmonary artery, and supravalvular aortic saturation samples were obtained  In the supine position, the patient began experiencing significant dyspnea and chest pressure post procedure. We sat her up on a wedge and gauge IV Lasix 40 mg with dramatic improvement in the symptom over 10-15 minutes.  MEDICATIONS: 1 mg Versed IV and fentanyl 50 mcg  CONTRAST:  Total of 130 cc.  COMPLICATIONS:  Dyspnea and chest discomfort relieved with sitting the patient up and giving IV Lasix.   HEMODYNAMICS:  Aortic pressure 127/99  mmHg; LV pressure 135/22 mmHg; LVEDP 32 mm mercury; RA  16 mm mercury, RV 52/14 mmHg, PA 53/26 mmHg, PCWP(mean) 32 mm mercury mean with a V wave to 52 mm mercury, Cardiac Output 6.06 L/minute with cardiac index of 2.82 L/minute/m2  ANGIOGRAPHIC DATA:   The left main coronary artery is normal..  The left anterior descending artery is widely patent, dominant, wrapping around the left ventricular apex. Large diagonal branch arises proximally. No obstruction is noted..  The left circumflex artery is widely patent without evidence of coronary obstruction. 3 obtuse marginal branches arise with the first obtuse marginal being a large branching vessel...  The right coronary artery is dominant and widely patent. There is a large PDA and left ventricular branch. There is also a large acute marginal branch.Marland Kitchen  LEFT VENTRICULOGRAM:  Left ventricular angiogram was done in the 30 RAO projection and revealed an enlarged LV, an estimated ejection fraction of 45%. There is mild global hypokinesis. There is moderately severe to severe MR. The left atrium appears to be significantly dilated.   IMPRESSIONS:  1. Moderately severe to severe mitral regurgitation with significant left atrial enlargement.   2. Low-normal to mildly depressed LV systolic function with LV enlargement and EF of 45 percent. Patient has acute on chronic combined systolic and diastolic heart failure.  3. Normal coronary arteries.  4. Moderately severe pulmonary hypertension    RECOMMENDATION:  TCTS evaluation for consideration of mitral valve repair/replacement.

## 2013-09-13 NOTE — H&P (View-Only) (Signed)
Cardiology Consultation Note  Patient ID: Paula Wolfe, MRN: [id number], DOB/AGE: [birth year] 27 y.o. Admit date: 09/11/2013   Date of Consult: 09/12/2013 Primary Physician: Domingo Sep, MD Primary Cardiologist: New to [facility]  Chief Complaint: SOB Reason for Consult: severe MR  HPI: Paula Wolfe is a 28 y/o F with history of hypothyroidism, nonmalignant thyroid nodules, menometrorrhagia, & anemia (Hgb 9.6 in Oct 2014) who presented to [facility] with acute SOB. We are asked to see for new finding of severe MR by echocardiogram. She does report that her parents told her as a child she had some sort of valve problem that was discovered in regular check-ups but denies subsequent workup for this. Denies hx of rheumatic fever or any OTC/herbal/diet supplements.  She was in her usual state of health yesterday watching a movie when she developed sudden dyspnea. Her breathing quickened and she had some chest tightness thus EMS was called. She spit up frothy pink/red sputum. EMS place the patient on CPAP due to POx 76% and gave her some albuterol nebulizer and Solu-Medrol. She then developed vomiting that was treated with Zofran. She arrived on Bipap and was titrated down to Storla. Troponins neg x 4 and pBNP 260. She was still anemic with Hgb 9.8, microcytic, severe iron deficiency with ferritin 6 and iron <10. Flu negative and thyroid function normal. She states she had been exercising this past year because she no longer wanted to be overweight and went from 290-270. She denies any exertional symptoms. 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 She still has been eating healthier and as a result has lost another 5 lbs over 2 months. Denies palpitations, orthopnea, syncope, BRBPR, melena, hematemesis, fever, chills. She has chronic L ankle edema after a broken foot that is not significantly changed. She has occasional chest  tightness, about 1x/month, associated with increased episodes of stress. She was given 20mg  IV Lasix this morning and placed on Lopressor 25mg  BID, - 1L UOP recorded thus far. CT angio showed no central PE (suboptimal bolus timing), ground glass opacity ? Interstitial edema or pneumonia, mild cardiomegaly, thyromegaly. 2D echo showed EF 50-55%, no RWMA, severe MR, severely dilated LA, PAP . She and her boyfriend deny snoring or apneic events. Only medicine she's on is levothyroxine. Denies any OTC supplements medications aside from Advil during menstruation.  Past Medical History  Diagnosis Date  . Hypothyroidism   . Nontoxic multinodular goiter     a. Pt reports prior ultrasound/bx that were negative but was told she would need to keep an eye on them in the future. Benign FNA per [facility] for this dx.  . Menometrorrhagia     a. Improved after tx of hypothyroidism, but recurred 05/2013.  Marland Kitchen Heart valve problem     a. Pt states she was told this as a child.  . Anemia     a. Hgb 9.6 in 05/2013.  . Broken foot     a. Remote broken foot with residual chronic LLE edema.      Most Recent Cardiac Studies: 2D Echo 09/12/13 - Left ventricle: The cavity size was mildly dilated. Systolic function was normal. The estimated ejection fraction was in the range of 50% to 55%. Wall motion was normal; there were no regional wall motion abnormalities. - Mitral valve: Severe regurgitation. - Left atrium: The atrium was severely dilated. - Pulmonary arteries: Systolic pressure was mildly increased. PA  peak pressure: 35mm Hg (S). Transthoracic echocardiography. M-mode, complete 2D,    Surgical History:  Past Surgical History  Procedure Laterality Date  . External ear surgery       Home Meds: Prior to Admission medications   Medication Sig Start Date End Date Taking? Authorizing Provider  levothyroxine (SYNTHROID, LEVOTHROID) 75 MCG tablet Take 75 mcg by mouth daily before breakfast.   Yes  Historical Provider, MD    Inpatient Medications:  . [START ON 09/13/2013] aspirin EC  81 mg Oral Daily  . enoxaparin (LOVENOX) injection  40 mg Subcutaneous Q24H  . ferrous sulfate  325 mg Oral BID WC  . [START ON 09/13/2013] influenza vac split quadrivalent PF  0.5 mL Intramuscular Tomorrow-1000  . levothyroxine  75 mcg Oral QAC breakfast  . metoprolol tartrate  25 mg Oral BID      Allergies: No Known Allergies  History   Social History  . Marital Status: Single    Spouse Name: N/A    Number of Children: N/A  . Years of Education: N/A   Occupational History  .      Teaching   Social History Main Topics  . Smoking status: Never Smoker   . Smokeless tobacco: Never Used  . Alcohol Use: Yes     Comment: occasionally - 1-2x/month  . Drug Use: No  . Sexual Activity: Yes    Birth Control/ Protection: None   Other Topics Concern  . Not on file   Social History Narrative  . No narrative on file     Family History  Problem Relation Age of Onset  . Hypertension Mother   . Hypertension Sister   . Hypertension Father   . Heart Problems Maternal Grandfather     details unclear     Review of Systems: General: negative for chills, fever, night sweats Cardiovascular: see above Dermatological: negative for rash Urologic: negative for hematuria Abdominal: negative for nausea, vomiting, diarrhea, bright red blood per rectum, melena, or hematemesis Neurologic: negative for visual changes, syncope, or dizziness All other systems reviewed and are otherwise negative except as noted above.  Labs:  Recent Labs  09/12/13 0013 09/12/13 0546 09/12/13 1026  TROPONINI <0.30 <0.30 <0.30   Lab Results  Component Value Date   WBC 6.8 09/12/2013   HGB 11.9* 09/12/2013   HCT 35.0* 09/12/2013   MCV 72.0* 09/12/2013   PLT 286 09/12/2013     Recent Labs Lab 09/12/13 0013 09/12/13 0020  NA 138 142  K 3.6* 3.7  CL 103 107  CO2 20  --   BUN 14 13  CREATININE 0.94 1.00    CALCIUM 8.7  --   GLUCOSE 118* 117*   Radiology/Studies:  Ct Angio Chest Pe W/cm &/or Wo Cm 09/12/2013   CLINICAL DATA:  Acute onset dyspnea  EXAM: CT ANGIOGRAPHY CHEST WITH CONTRAST  TECHNIQUE: Multidetector CT imaging of the chest was performed using the standard protocol during bolus administration of intravenous contrast. Multiplanar CT image reconstructions including MIPs were obtained to evaluate the vascular anatomy.  CONTRAST:  80mL OMNIPAQUE IOHEXOL 350 MG/ML SOLN  COMPARISON:  09/12/2013 chest radiograph  FINDINGS: The bolus timing is not optimized to evaluate the pulmonary arteries and the study is further degraded by respiratory motion. Allowing for this, no central pulmonary embolism. The distal segmental and subsegmental branches are nondiagnostic.  The thyroid gland appears enlarged.  Normal caliber aorta.  Cardiac enlargement.  No pleural or pericardial effusion.  No intrathoracic lymphadenopathy.  Upper abdominal  images show nothing acute.  Central airways are patent.  No pneumothorax.  Bilateral areas of ground-glass opacity and mosaic attenuation.  No acute osseous finding.  Review of the MIP images confirms the above findings.  IMPRESSION: Suboptimal contrast bolus timing. No central pulmonary embolism. The distal segmental and subsegmental branches are nondiagnostic.  Bilateral areas of ground-glass opacity may reflect interstitial pneumonia or edema.  Areas of mosaic attenuation may reflect air trapping/small airways disease.  Mild cardiomegaly.  Thyromegaly.   Electronically Signed   By: Jearld LeschAndrew  DelGaizo M.D.   On: 09/12/2013 03:16   Dg Chest Port 1 View 09/12/2013   CLINICAL DATA:  Chest pain, shortness of breath  EXAM: PORTABLE CHEST - 1 VIEW  COMPARISON:  None.  FINDINGS: Prominent heart size but normal vascularity. No edema, pneumonia, collapse or consolidation. No effusion or pneumothorax. Trachea midline.  IMPRESSION: No active disease.   Electronically Signed   By: Ruel Favorsrevor  Shick  M.D.   On: 09/12/2013 00:55   EKG: sinus tach 107 no significant ECG changes, borderline prolonged QT interval  Physical Exam: Blood pressure 136/78, pulse 101, temperature 97.6 F (36.4 C), temperature source Oral, resp. rate 17, height 5\' 2"  (1.575 m), weight 265 lb 1.6 oz (120.249 kg), last menstrual period 08/22/2013, SpO2 100.00%. General: Well developed, well nourished overweight AAF in no acute distress. Head: Normocephalic, atraumatic, sclera non-icteric, no xanthomas, nares are without discharge.  Neck: Negative for carotid bruits. JVD not elevated. Lungs: Coarse but clear bilaterally to auscultation without wheezes, rales, or rhonchi. Breathing is unlabored. Heart: Tachycardic, regular with S1 S2. 2/6 SEM at apex but also light systolic murmur at RUSB. No rubs gallops appreciated. Abdomen: Soft, non-tender, non-distended with normoactive bowel sounds. No hepatomegaly. No rebound/guarding. No obvious abdominal masses. Msk:  Strength and tone appear normal for age. Extremities: No clubbing or cyanosis. Tr pedal edema.  Distal pedal pulses are 2+ and equal bilaterally. Neuro: Alert and oriented X 3. No facial asymmetry. No focal deficit. Moves all extremities spontaneously. Psych:  Responds to questions appropriately with a normal affect.   Assessment and Plan:   1. Acute respiratory failure 2. ?Pulmonary edema due to severe MR 3. Hypothyroidism, currently euthyroid 4. Chronic microcytic anemia 5. Menometrorrhagia  Patient seen and examined with Dr. Eden EmmsNishan. Etiology of MR is unclear. Although presentation of SOB was fairly abrupt with pink frothy sputum, she does describe some sort of heart valve issue in the past. At this time we are [facility] along with TEE to further evaluate - Dr. Eden EmmsNishan has discussed this with he patient who is in agreement. No clinical evidence of endocarditis and TEE will help us look further. Lungs currently sound clear thus will hold off on further  diuresis. Follow volume. Decrease ASA. Check urine pregnancy test for completeness. Agree with iron supplementation; not sure if she's someone who may need consideration of IV iron to rebuild stores but will defer to IM.  Signed, Ronie Spiesayna Dunn PA-C 09/12/2013, 4:34 PM   Patient examined chart reviewed. Discussed care with patient and boyfriend.  Clinical presentation is unusual.  Consistant with flash pulmonary edema but BNP only in 200 range.  CXR read as NAD but has vascular congestion and cephalazation to my eye.  CT poor quality no obvious PE.  She has a history of some sort of heart valve problem  Echo reviewed.  LVE with EF ? 50% .  Posterior leaflet of mitral valve tethered and thickened  PISA radius  Only . 6  And I would have qualitatively graded MR as moderate .  Will arrange TEE and right and left heart cath for am.  TEE should inlcude PISA quantification and will give [place] better idea of morphology and mechanism of MR since I don't see frank prolapse.  Right heart to assess PA pressures and EDP  Risks including stroke discussed willing to proceed  Charlton Haws

## 2013-09-14 DIAGNOSIS — J810 Acute pulmonary edema: Secondary | ICD-10-CM

## 2013-09-14 DIAGNOSIS — Z01810 Encounter for preprocedural cardiovascular examination: Secondary | ICD-10-CM

## 2013-09-14 DIAGNOSIS — D509 Iron deficiency anemia, unspecified: Secondary | ICD-10-CM

## 2013-09-14 DIAGNOSIS — E039 Hypothyroidism, unspecified: Secondary | ICD-10-CM

## 2013-09-14 LAB — COMPREHENSIVE METABOLIC PANEL
ALT: 15 U/L (ref 0–35)
AST: 14 U/L (ref 0–37)
Albumin: 3.5 g/dL (ref 3.5–5.2)
Alkaline Phosphatase: 45 U/L (ref 39–117)
BILIRUBIN TOTAL: 0.5 mg/dL (ref 0.3–1.2)
BUN: 17 mg/dL (ref 6–23)
CHLORIDE: 103 meq/L (ref 96–112)
CO2: 24 meq/L (ref 19–32)
CREATININE: 1.08 mg/dL (ref 0.50–1.10)
Calcium: 8.9 mg/dL (ref 8.4–10.5)
GFR calc Af Amer: 81 mL/min — ABNORMAL LOW (ref >90)
GFR, EST NON AFRICAN AMERICAN: 70 mL/min — AB (ref >90)
GLUCOSE: 91 mg/dL (ref 70–99)
Potassium: 3.8 mEq/L (ref 3.7–5.3)
Sodium: 141 mEq/L (ref 137–147)
Total Protein: 7 g/dL (ref 6.0–8.3)

## 2013-09-14 LAB — HEMOGLOBIN A1C
HEMOGLOBIN A1C: 5.8 % — AB (ref <5.7)
Mean Plasma Glucose: 120 mg/dL — ABNORMAL HIGH (ref <117)

## 2013-09-14 LAB — URINALYSIS, ROUTINE W REFLEX MICROSCOPIC
BILIRUBIN URINE: NEGATIVE
GLUCOSE, UA: NEGATIVE mg/dL
HGB URINE DIPSTICK: NEGATIVE
KETONES UR: NEGATIVE mg/dL
Nitrite: NEGATIVE
PH: 6 (ref 5.0–8.0)
Protein, ur: NEGATIVE mg/dL
Specific Gravity, Urine: 1.033 — ABNORMAL HIGH (ref 1.005–1.030)
Urobilinogen, UA: 1 mg/dL (ref 0.0–1.0)

## 2013-09-14 LAB — CBC
HCT: 31.6 % — ABNORMAL LOW (ref 36.0–46.0)
HEMOGLOBIN: 9.6 g/dL — AB (ref 12.0–15.0)
MCH: 22.1 pg — AB (ref 26.0–34.0)
MCHC: 30.4 g/dL (ref 30.0–36.0)
MCV: 72.8 fL — ABNORMAL LOW (ref 78.0–100.0)
Platelets: 316 10*3/uL (ref 150–400)
RBC: 4.34 MIL/uL (ref 3.87–5.11)
RDW: 17.7 % — ABNORMAL HIGH (ref 11.5–15.5)
WBC: 8.4 10*3/uL (ref 4.0–10.5)

## 2013-09-14 LAB — URINE MICROSCOPIC-ADD ON

## 2013-09-14 LAB — PRO B NATRIURETIC PEPTIDE: Pro B Natriuretic peptide (BNP): 293.7 pg/mL — ABNORMAL HIGH (ref 0–125)

## 2013-09-14 MED ORDER — FUROSEMIDE 40 MG PO TABS: 40.0000 mg | ORAL_TABLET | ORAL | Status: DC | PRN

## 2013-09-14 NOTE — Progress Notes (Signed)
Patient Name: Paula AblesShatina Renee Wolfe Date of Encounter: 09/14/2013     Principal Problem:   SOB (shortness of breath) Active Problems:   Respiratory distress   Flash pulmonary edema   Mitral regurgitation   Acute on chronic combined systolic and diastolic heart failure   Hypothyroidism   Pulmonary hypertension   Obesity   Anemia, iron deficiency    SUBJECTIVE  The patient is feeling well today.  No dyspnea overnight.  Lying flat comfortably.  CURRENT MEDS . aspirin EC  81 mg Oral Daily  . enoxaparin (LOVENOX) injection  40 mg Subcutaneous Q24H  . ferrous sulfate  325 mg Oral BID WC  . influenza vac split quadrivalent PF  0.5 mL Intramuscular Tomorrow-1000  . levothyroxine  75 mcg Oral QAC breakfast  . metoprolol tartrate  25 mg Oral BID    OBJECTIVE  Filed Vitals:   09/13/13 1600 09/13/13 2028 09/13/13 2128 09/14/13 0500  BP: 110/60 138/96  117/77  Pulse: 85 81 78 63  Temp:  98.1 F (36.7 C)  98.3 F (36.8 C)  TempSrc:  Oral  Oral  Resp:  18  18  Height:      Weight:    262 lb 5.1 oz (118.988 kg)  SpO2:  100%  99%   No intake or output data in the 24 hours ending 09/14/13 0735 Filed Weights   09/12/13 1100 09/13/13 0521 09/14/13 0500  Weight: 265 lb 1.6 oz (120.249 kg) 262 lb 6.4 oz (119.024 kg) 262 lb 5.1 oz (118.988 kg)    PHYSICAL EXAM  General: Pleasant, NAD. Neuro: Alert and oriented X 3. Moves all extremities spontaneously. Psych: Normal affect. HEENT:  Normal  Neck: Supple without bruits or JVD. Lungs:  Resp regular and unlabored, CTA. Heart: RRR .  Grade 2/6 apical holosystolic murmur of mitral regurgitation Abdomen: Soft, non-tender, non-distended, BS + x 4.  Extremities: No clubbing, cyanosis or edema. DP/PT/Radials 2+ and equal bilaterally.  Right radial pulse intact after catheter yesterday  Accessory Clinical Findings  CBC  Recent Labs  09/12/13 0013  09/13/13 0450 09/14/13 0345  WBC 6.8  --  7.6 8.4  NEUTROABS 4.1  --   --   --    HGB 9.8*  < > 8.9* 9.6*  HCT 31.7*  < > 28.6* 31.6*  MCV 72.0*  --  72.4* 72.8*  PLT 286  --  262 316  < > = values in this interval not displayed. Basic Metabolic Panel  Recent Labs  09/13/13 0450 09/14/13 0345  NA 142 141  K 4.2 3.8  CL 106 103  CO2 22 24  GLUCOSE 91 91  BUN 16 17  CREATININE 0.95 1.08  CALCIUM 8.6 8.9   Liver Function Tests  Recent Labs  09/14/13 0345  AST 14  ALT 15  ALKPHOS 45  BILITOT 0.5  PROT 7.0  ALBUMIN 3.5   No results found for this basename: LIPASE, AMYLASE,  in the last 72 hours Cardiac Enzymes  Recent Labs  09/12/13 0546 09/12/13 1026 09/12/13 1650  TROPONINI <0.30 <0.30 <0.30   BNP No components found with this basename: POCBNP,  D-Dimer No results found for this basename: DDIMER,  in the last 72 hours Hemoglobin A1C No results found for this basename: HGBA1C,  in the last 72 hours Fasting Lipid Panel No results found for this basename: CHOL, HDL, LDLCALC, TRIG, CHOLHDL, LDLDIRECT,  in the last 72 hours Thyroid Function Tests  Recent Labs  09/12/13 0546  TSH 2.620  TELE  Telemetry shows normal sinus rhythm  ECG    Radiology/Studies  Ct Angio Chest Pe W/cm &/or Wo Cm  09/12/2013   CLINICAL DATA:  Acute onset dyspnea  EXAM: CT ANGIOGRAPHY CHEST WITH CONTRAST  TECHNIQUE: Multidetector CT imaging of the chest was performed using the standard protocol during bolus administration of intravenous contrast. Multiplanar CT image reconstructions including MIPs were obtained to evaluate the vascular anatomy.  CONTRAST:  23mL OMNIPAQUE IOHEXOL 350 MG/ML SOLN  COMPARISON:  09/12/2013 chest radiograph  FINDINGS: The bolus timing is not optimized to evaluate the pulmonary arteries and the study is further degraded by respiratory motion. Allowing for this, no central pulmonary embolism. The distal segmental and subsegmental branches are nondiagnostic.  The thyroid gland appears enlarged.  Normal caliber aorta.  Cardiac  enlargement.  No pleural or pericardial effusion.  No intrathoracic lymphadenopathy.  Upper abdominal images show nothing acute.  Central airways are patent.  No pneumothorax.  Bilateral areas of ground-glass opacity and mosaic attenuation.  No acute osseous finding.  Review of the MIP images confirms the above findings.  IMPRESSION: Suboptimal contrast bolus timing. No central pulmonary embolism. The distal segmental and subsegmental branches are nondiagnostic.  Bilateral areas of ground-glass opacity may reflect interstitial pneumonia or edema.  Areas of mosaic attenuation may reflect air trapping/small airways disease.  Mild cardiomegaly.  Thyromegaly.   Electronically Signed   By: Jearld Lesch M.D.   On: 09/12/2013 03:16   Dg Chest Port 1 View  09/12/2013   CLINICAL DATA:  Chest pain, shortness of breath  EXAM: PORTABLE CHEST - 1 VIEW  COMPARISON:  None.  FINDINGS: Prominent heart size but normal vascularity. No edema, pneumonia, collapse or consolidation. No effusion or pneumothorax. Trachea midline.  IMPRESSION: No active disease.   Electronically Signed   By: Ruel Favors M.D.   On: 09/12/2013 00:55    ASSESSMENT AND PLAN 1. Moderately severe to severe mitral regurgitation with significant left atrial enlargement.  2. Low-normal to mildly depressed LV systolic function with LV enlargement and EF of 45 percent. Patient has acute on chronic combined systolic and diastolic heart failure.  3. Normal coronary arteries.  4. Moderately severe pulmonary hypertension 5. iron deficiency anemia workup per primary 6. Hypothyroidism  Plan: From cardiac standpoint it would be okay to discharge the patient home today which is her wish, if no further inpatient tests are needed for her anemia.  If she is discharged today continue current cardiac medications.  Would consider adding when necessary Lasix to have on hand if dyspnea recurs.  If discharged over the weekend she will see Dr. Cornelius Moras in the office next  week to discuss surgery further.  Signed, Cassell Clement MD

## 2013-09-14 NOTE — Progress Notes (Signed)
Pre-op Cardiac Surgery  Carotid Findings:  1-39% ICA stenosis.  Vertebral artery flow is antegrade.  Upper Extremity Right Left  Brachial Pressures    Radial Waveforms    Ulnar Waveforms    Palmar Arch (Allen's Test)     Findings:      Lower  Extremity Right Left  Dorsalis Pedis    Anterior Tibial    Posterior Tibial    Ankle/Brachial Indices      Findings:    

## 2013-09-14 NOTE — Discharge Summary (Signed)
Physician Discharge Summary  Paula Wolfe WRU:[id number] DOB: 07/20/1986 DOA: 09/11/2013  PCP: Paula Sep, MD  Admit date: 09/11/2013 Discharge date: 09/14/2013  Time spent: 45 minutes  Recommendations for Outpatient Follow-up:  -Will be discharged home today. -Will need to follow up with Dr. Cornelius Wolfe early next week for determination of timing of mitral valve surgery.   Discharge Diagnoses:  Principal Problem:   SOB (shortness of breath) Active Problems:   Respiratory distress   Flash pulmonary edema   Mitral regurgitation   Acute on chronic combined systolic and diastolic heart failure   Hypothyroidism   Pulmonary hypertension   Obesity   Anemia, iron deficiency   Discharge Condition: Stable and improved  Filed Weights   09/12/13 1100 09/13/13 0521 09/14/13 0500  Weight: 120.249 kg (265 lb 1.6 oz) 119.024 kg (262 lb 6.4 oz) 118.988 kg (262 lb 5.1 oz)    History of present illness:  Paula Wolfe is a 28 y.o. female with Past medical history of hypothyroidism with thyroid nodules.  The patient is coming from home.  The patient mentions that she was at her baseline until this afternoon. She was watching TV and lying on the couch and suddenly started having complaints of shortness of breath that she couldn't take a deep breath and she had some chest tightness with that. She tried to relax but her breathing was progressively worsening and therefore they called the EMS. EMS place the patient on CPAP give her some albuterol nebulizer and Solu-Medrol. She had one episode of vomiting treated with Zofran before arrival.  In the ED she was continued on BiPAP and was given nebulizer treatment.  At the time of my evaluation she was on room air.  The patient continued to complain of some shortness of breath, she felt palpitation as well. She denies any orthopnea at her baseline. She has chronic left leg swelling which is present since many years. She thinks that she might have  aspirated on her saliva when she was an aching and having shortness of breath.  She had some pink frothy sputum after this episode.  She mentions that her significant other has some upper respiratory symptoms.  She denies smoking, exposure to fumes, recent travel, recent immobilization, prior cardiac history, prior pulmonary history, significant family history.  Of note she mentions that she has cold intolerance, weight loss of 10 pounds, and her thyroid was last checked in October. Hospitalist admission was requested.   Hospital Course:   SOB  -Resolved.  -Probably related to flash-pulmonary edema associated with severe MR.  -CT Angio with ground-glass opacities ?PNA vs edema.  -ECHO reviewed: preserved EF, severe MR.  -See below for details.  Severe MR  -TEE and cath performed with results as below.  -Has severe mitral regurgitation with pulmonary HTN and mildly depressed EF. -Has been seen by Dr. Cornelius Wolfe for consideration of MV repair vs replacement. -Will need to follow with Dr. Cornelius Wolfe in his office early next week.  Iron Deficiency Anemia  -Hb 8.9 with a ferritin of 6.  -Likely related to menstrual losses.  -Start PO FeSo4.  -No evidence of GI bleed per history.  -No need for transfusion at this point.  Hypothyroidism  -TSH WNL.  -Continue home dose of synthroid.   Procedures: TEE:  Very mild prolapse of the anterior mitral leaflet. Posteriler leaflet is shorter, appears almost tethered. There is severe MR directed more posteriorly into LA that tracks around the back of the chamber. LVEF is mildly depressed  at around 50%.   Cardiac cath:  IMPRESSIONS: 1. Moderately severe to severe mitral regurgitation with significant left atrial enlargement.  2. Low-normal to mildly depressed LV systolic function with LV enlargement and EF of 45 percent. Patient has acute on chronic combined systolic and diastolic heart failure.  3. Normal coronary arteries.  4. Moderately severe pulmonary  hypertension   Consultations:  Cardiology  CVTS  Discharge Instructions  Discharge Orders   Future Appointments Provider Department Dept Phone   09/16/2013 8:00 AM [facility] [phone]   Future Orders Complete By Expires   Diet - low sodium heart healthy  As directed    Discontinue IV  As directed    Discontinue IV  As directed    Increase activity slowly  As directed    Increase activity slowly  As directed        Medication List         furosemide 40 MG tablet  Commonly known as:  LASIX  Take 1 tablet (40 mg total) by mouth as needed (Shortness of breath).     levothyroxine 75 MCG tablet  Commonly known as:  SYNTHROID, LEVOTHROID  Take 75 mcg by mouth daily before breakfast.       No Known Allergies     Follow-up Information   Schedule an appointment as soon as possible for a visit with Paula NailsWEN,Paula H, MD. (For early next week)    Specialty:  Cardiothoracic Surgery   Contact information:   [address] [place] [address] [phone]        The results of significant diagnostics from this hospitalization (including imaging, microbiology, ancillary and laboratory) are listed below for reference.    Significant Diagnostic Studies: Ct Angio Chest Pe W/cm &/or Wo Cm  09/12/2013   CLINICAL DATA:  Acute onset dyspnea  EXAM: CT ANGIOGRAPHY CHEST WITH CONTRAST  TECHNIQUE: Multidetector CT imaging of the chest was performed using the standard protocol during bolus administration of intravenous contrast. Multiplanar CT image reconstructions including MIPs were obtained to evaluate the vascular anatomy.  CONTRAST:  80mL OMNIPAQUE IOHEXOL 350 MG/ML SOLN  COMPARISON:  09/12/2013 chest radiograph  FINDINGS: The bolus timing is not optimized to evaluate the pulmonary arteries and the study is further degraded by respiratory motion. Allowing for this, no central pulmonary embolism. The distal segmental  and subsegmental branches are nondiagnostic.  The thyroid gland appears enlarged.  Normal caliber aorta.  Cardiac enlargement.  No pleural or pericardial effusion.  No intrathoracic lymphadenopathy.  Upper abdominal images show nothing acute.  Central airways are patent.  No pneumothorax.  Bilateral areas of ground-glass opacity and mosaic attenuation.  No acute osseous finding.  Review of the MIP images confirms the above findings.  IMPRESSION: Suboptimal contrast bolus timing. No central pulmonary embolism. The distal segmental and subsegmental branches are nondiagnostic.  Bilateral areas of ground-glass opacity may reflect interstitial pneumonia or edema.  Areas of mosaic attenuation may reflect air trapping/small airways disease.  Mild cardiomegaly.  Thyromegaly.   Electronically Signed   By: Jearld LeschAndrew  DelGaizo M.D.   On: 09/12/2013 03:16   Dg Chest Port 1 View  09/12/2013   CLINICAL DATA:  Chest pain, shortness of breath  EXAM: PORTABLE CHEST - 1 VIEW  COMPARISON:  None.  FINDINGS: Prominent heart size but normal vascularity. No edema, pneumonia, collapse or consolidation. No effusion or pneumothorax. Trachea midline.  IMPRESSION: No active disease.   Electronically Signed   By:  Ruel Favors M.D.   On: 09/12/2013 00:55    Microbiology: No results found for this or any previous visit (from the past 240 hour(s)).   Labs: Basic Metabolic Panel:  Recent Labs Lab 09/12/13 0013 09/12/13 0020 09/13/13 0450 09/14/13 0345  NA 138 142 142 141  K 3.6* 3.7 4.2 3.8  CL 103 107 106 103  CO2 20  --  22 24  GLUCOSE 118* 117* 91 91  BUN 14 13 16 17   CREATININE 0.94 1.00 0.95 1.08  CALCIUM 8.7  --  8.6 8.9   Liver Function Tests:  Recent Labs Lab 09/14/13 0345  AST 14  ALT 15  ALKPHOS 45  BILITOT 0.5  PROT 7.0  ALBUMIN 3.5   No results found for this basename: LIPASE, AMYLASE,  in the last 168 hours No results found for this basename: AMMONIA,  in the last 168 hours CBC:  Recent  Labs Lab 09/12/13 0013 09/12/13 0020 09/13/13 0450 09/14/13 0345  WBC 6.8  --  7.6 8.4  NEUTROABS 4.1  --   --   --   HGB 9.8* 11.9* 8.9* 9.6*  HCT 31.7* 35.0* 28.6* 31.6*  MCV 72.0*  --  72.4* 72.8*  PLT 286  --  262 316   Cardiac Enzymes:  Recent Labs Lab 09/12/13 0013 09/12/13 0546 09/12/13 1026 09/12/13 1650  TROPONINI <0.30 <0.30 <0.30 <0.30   BNP: BNP (last 3 results)  Recent Labs  09/12/13 0013 09/14/13 0345  PROBNP 260.0* 293.7*   CBG: No results found for this basename: GLUCAP,  in the last 168 hours     Signed:  Chaya Jan  [facility] Pager: [phone] 09/14/2013, 3:56 PM

## 2013-09-14 NOTE — Discharge Instructions (Signed)
Mitral Valve Replacement Mitral valve replacement is surgical replacement of the mitral valve. Many mitral valves can be repaired, especially if they leak from wearing out. When the valve is too damaged to repair, the valve must be replaced. Valves damaged by rheumatic disease often must be replaced. An artificial (prosthetic) valve is used to do this. Three types of prosthetic valves are available:  Mechanical valves made entirely from man-made materials.   Donor valves made from human donors. These are only used in special situations.   Biological valves made from animal tissues.  LET [facility] CARE PROVIDER KNOW ABOUT:  Any allergies you have.   All medicines you are taking, including vitamins, herbs, eye drops, creams, and over-the-counter medicines.   Previous problems you or members of your family have had with the use of anesthetics.   Any blood disorders you have.   Previous surgeries you have had.   Medical conditions you have.  RISKS AND COMPLICATIONS Generally, mitral valve replacement is a safe procedure. However, as with any procedure, complications can occur. Possible complications include:   Blood clotting caused by the new valve. Replacement with a mechanical valve requires lifelong treatment with medicine to prevent blood clots.   Infection in the new valve. Infection is more common with valve replacement than with valve repair.   Valve failure. Valve failure is more common with valve replacement than with valve repair. Pig heart valves tend to fail after about 8 to 10 years.   Effects from the surgery itself, such as bleeding, infection, and risks of anesthesia. These risks are rare.  BEFORE THE PROCEDURE  You may need to have blood tests, a test to check heart rhythm (electrocardiography), or echocardiography to evaluate your heart valves and the blood flow through them.  Ask your health care provider about changing or stopping your regular  medicines.   Do not eat or drink anything for at least 8 hours before the surgery. Ask your health care provider if it is okay to take any needed medicines with a sip of water.   Do not smoke for as long as possible before the surgery. Smoking can increase the chances of a healing problem after surgery.  PROCEDURE  There are two types of mitral valve replacement surgeries:   Traditional mitral valve replacement surgery. You are given medicine to make you sleep (general anesthetic). You are then placed on a heart-lung bypass machine. This machine provides oxygen to your blood while the heart is undergoing surgery. The surgery generally lasts from 3 to 5 hours. During surgery, the surgeon makes a large cut (incision) in the chest. Sometimes the heart is cooled to slow or stop the heartbeat. The damaged mitral valve is removed and replaced with a prosthetic heart valve. After the replacement valve is sewn in, the sac around the heart and the chest are closed.   Minimally invasive mitral valve replacement surgery. This is done through a smaller incision. The chest is not opened as in traditional surgery. If your condition allows for this procedure, there is often less blood loss, less pain, a shorter hospital stay, and faster recovery compared to traditional surgery.  AFTER THE PROCEDURE Recovery from heart valve surgery usually involves a few days in the intensive care unit (ICU) of a hospital.  Document Released: 12/02/2004 Document Revised: 04/03/2013 Document Reviewed: 01/02/2013 [facility] Patient Information 2014 [place], [place].   Mitral Valve Replacement, Care After  Refer to this sheet in the next few weeks. These instructions provide you  with information on caring for yourself after your procedure. Your health care provider may also give you specific instructions. Your treatment has been planned according to current medical practices, but problems sometimes occur. Call your health care  provider if you have any problems or questions after your procedure.  HOME CARE INSTRUCTIONS   Only take over-the-counter or prescription medicines as directed by your health care provider.  Take your temperature every morning for the first 7 days after surgery. Write these down.  Weigh yourself every morning for at least 7 days after surgery. Write your weight down.  Wear elastic stockings during the day for at least 2 weeks after surgery. Use them longer if your ankles are swollen. The stockings help blood flow and help reduce swelling in the legs.  Take frequent naps or rest often throughout the day.  Avoid lifting more than 10 lb (4.5 kg) or pushing or pulling things with your arms for 6 8 weeks or as directed by your health care provider.  Avoid driving or airplane travel for 4 6 weeks after surgery or as directed. If you are riding in a car for an extended period, stop every 1 2 hours to stretch your legs.  Avoid crossing your legs.  Avoid climbing stairs and using the handrail to pull yourself up for the first 2 3 weeks after surgery.  Do not take baths for 2 4 weeks after surgery. Take showers once your health care provider approves. Pat incisions dry. Do not rub incisions with a washcloth or towel.  Return to work as directed by your health care provider.  Drink enough fluids to keep your urine clear or pale yellow.  Do not strain to have a bowel movement. Eat high-fiber foods if you become constipated. You may also take a medicine to help you have a bowel movement (laxative) as directed by your health care provider.  Resume sexual activity as directed by your health care provider. SEEK MEDICAL CARE IF:   You develop a skin rash.   Your weight is increasing each day over 2 3 days.  Your weight increases by 2 or more pounds (1 kg) in a single day. SEEK IMMEDIATE MEDICAL CARE IF:   You develop chest pain that is not coming from your incision.  You develop shortness of  breath or difficulty breathing.  You have a fever.  You have increased drainage from your wound.  You see redness, swelling, or have increasing pain in the wound.  You have pus coming from your wound.  You develop lightheadedness. MAKE SURE YOU:  Understand these directions.  Will watch your condition.  Will get help right away if you are not doing well or get worse. Document Released: 02/18/2005 Document Revised: 04/03/2013 Document Reviewed: 01/01/2013 [facility] Patient Information 2014 [place].   Mitral Valvular Regurgitation Mitral valvular regurgitation (MVR, MR) is a condition in which there is a leaky mitral valve. The mitral valve is the large valve between the two left chambers of the heart. When the large muscular ventricle contracts to pump blood, the mitral valve keeps that blood from flowing backward and back into the atrium. If there is too much regurgitation, the heart has to work harder. This eventually can cause heart failure. When your heart goes into failure, you do not feel well. You have shortness of breath (dyspnea) with exertion. The kidneys do not work as well so you may retain fluid. This is one of the reasons your lower legs and ankles may  swell. You may have a rapid weight gain. In addition to this swelling, the fluid retention makes fluid back up in the lungs. This causes additional shortness of breath, which then makes the failure worse. The first sign you will usually recognize is shortness of breath with exertion (climbing stairs for example). You will also usually get a rapid heartbeat. Upon discharge from this location, weigh yourself after arriving home. Record your weight at the same time every day as this will provide a record of your progress. As you get better, your weight will usually go down. Follow a low sodium (low salt) diet. You may notice that you get short of breath while sleeping. The heart actually has to work harder while you are lying  down. This may also produce a night cough or make it necessary to sleep with two or more pillows. SYMPTOMS  You may have no symptoms if mitral regurgitation is mild. It may be discovered only during a routine exam by your caregiver when a heart murmur is heard. DIAGNOSIS  The best study for mitral regurgitation is the echocardiogram (ultrasound of the heart). This shows the cause of the MR and how bad it is. It also gives information about the left ventricle and atrium (the two heart chambers that are bridged by the mitral valve.) TREATMENT   Early MR may be treated with medications. If there are no medication allergies or problems, ACE Inhibitors are commonly used in the treatment of MR. Under treatment, these symptoms usually improve rapidly. Medications treat but will not cure or slow the progression of the MR. This is more dependent on the cause of the MR.  If MR becomes more severe, surgery may become necessary to repair or replace the valve. This is called open heart surgery.  There is no absolute age limitation to valve surgery. 27 year old people have had their valves replaced. The risk of stroke and death is low with open heart surgery, but does increase a with age and other medical problems. Elderly patients that are otherwise healthy usually do well with valve replacement surgery.  A cardiac catheterization is usually done prior to valve surgery unless the patient is very young. This is done to check the health of coronary arteries. If there are blockages, a bypass can be done while the chest is open for the valve replacement. If you are beginning to have symptoms from mitral regurgitation or are waiting for a surgical procedure to help you with this problem, following are some of the things you can do to help yourself while you are waiting for surgery or are simply putting off the surgery to see if it is needed. HOME CARE INSTRUCTIONS   Activity Level--- Your caregiver will help you  determine what type of exercise program may be helpful. It is important to maintain strength and increase it if possible. Pace your activities and avoid shortness of breath or chest pain. Plan activities for at least an hour after meals or before eating. This allows your body to handle one activity at a time. Your caregiver can help advise you for activities.  Diet--- Maintain a low salt diet or as directed by your caregiver and eat a heart healthy diet. Get diet information from your caregiver or dietician. Remove your salt shaker and avoid adding salt to you foods. Measure the amounts of fluids you take in per day in cups and record these amounts.  Discharge Medications--- You may have been prescribed an ACE inhibitor or a  beta blocker to take for your heart failure. Take either as directed as this improves your heart function and your survival. Ask your caregiver if being on statins (cholesterol lowering drugs) would be helpful.  Weight Monitoring---Weigh yourself today. When you get home, compare it to your scale and record your weight. Weigh twice per day and record these weights and try to weigh at the same time every day. It is best to weigh first thing in the morning, in your same clothes, after going to the bathroom and before eating or drinking anything. Place the scale on a hard surfaced floor. Bring these weights to your caregiver to be reviewed during your appointments.  Blood pressure monitoring should be done twice per week. You can get a home blood pressure cuff at your drugstore. Record these values and bring them with you for your clinic visits. Notify your caregiver if you become dizzy or lightheaded upon standing up.  Be familiar with your medications--- If you have trouble remembering when you took them, write down times or set your medications out in advance for the day or the week to avoid problems. If you are on medications and do not remember if you have taken your medication, just  skip it for that day unless your caregiver advises you otherwise. If you are on a diuretic (water pill), take these in the morning so you are not up all night going to the bathroom.  If you are currently a smoker, it is time to quit. Nicotine makes your heart work harder and is one of the leading causes of cardiac (heart) deaths. Do not leave without a smoking cessation plan or instructions on help available to quit smoking.  Immunization with influenza and pneumococcal vaccines may reduce the risk of respiratory infection.  Nonsteroidal anti-inflammatory drugs should not be used. They can cause sodium (salt) retention and also may hurt the action of diuretics and ACE inhibitors.  Aldosterone Antagonists may have beneficial effects.  If you do not follow your diet and take your medications properly, this may rapidly lead to emergency care or hospitalization. Follow the advice of your caregiver.  What To Do If Symptoms Worsen--- If there are immediate problems go to the Emergency Department. This would include any symptoms which brought you in and which are getting worse rather than better. Call emergency services (911 in [place] for immediate care. DAILY PATH TO QUALITY LIVING  Monitor weight and record.  Monitor blood pressure and record.  Monitor fluid intake.  Monitor Sodium intake.  Monitor Activity Levels.  Take your medications.  Stop all use of nicotine.  Avoid alcohol.  Know when to call for help and do so. SEEK IMMEDIATE MEDICAL CARE IF:  Your weight increases by 03 lb/1.4 kg in 1 day or 05 lb/2.3 kg in a week, or as your caregiver suggests.  You notice increasing shortness of breath during rest, sleeping, or with activity, and which is unusual for you.  You develop chest pain.  You develop sweating or nausea which is unusual for you.  You notice increased swelling in your hands, feet, ankles or abdomen.  You have a feeling of fullness in your abdomen or develop  nausea or loss of appetite.  You notice dizziness, blurred vision, headache, or unsteadiness. Make an appointment with your caregiver as directed for follow-up. MAKE SURE YOU:   Understand these instructions.  Will watch your condition.  Will get help right away if you are not doing well or get worse. Document  Released: 10/19/2004 Document Revised: 10/24/2011 Document Reviewed: 09/28/2007 [facility] Patient Information 2014 [place], [place].

## 2013-09-16 ENCOUNTER — Other Ambulatory Visit: Payer: Self-pay | Admitting: *Deleted

## 2013-09-16 ENCOUNTER — Encounter (HOSPITAL_COMMUNITY): Payer: Self-pay | Admitting: Pharmacy Technician

## 2013-09-16 ENCOUNTER — Encounter (HOSPITAL_COMMUNITY): Payer: BC Managed Care – PPO

## 2013-09-16 ENCOUNTER — Ambulatory Visit (INDEPENDENT_AMBULATORY_CARE_PROVIDER_SITE_OTHER): Payer: BC Managed Care – PPO | Admitting: Thoracic Surgery (Cardiothoracic Vascular Surgery)

## 2013-09-16 ENCOUNTER — Encounter: Payer: Self-pay | Admitting: Thoracic Surgery (Cardiothoracic Vascular Surgery)

## 2013-09-16 ENCOUNTER — Ambulatory Visit (HOSPITAL_COMMUNITY)
Admission: RE | Admit: 2013-09-16 | Discharge: 2013-09-16 | Disposition: A | Discharge status: Home / Self Care | Payer: BC Managed Care – PPO | Source: Ambulatory Visit | Attending: Thoracic Surgery (Cardiothoracic Vascular Surgery) | Admitting: Thoracic Surgery (Cardiothoracic Vascular Surgery)

## 2013-09-16 VITALS — BP 154/93 | HR 79 | Resp 18 | Ht 62.0 in | Wt 262.0 lb

## 2013-09-16 DIAGNOSIS — R609 Edema, unspecified: Secondary | ICD-10-CM

## 2013-09-16 DIAGNOSIS — I272 Pulmonary hypertension, unspecified: Secondary | ICD-10-CM

## 2013-09-16 DIAGNOSIS — I5042 Chronic combined systolic (congestive) and diastolic (congestive) heart failure: Secondary | ICD-10-CM | POA: Insufficient documentation

## 2013-09-16 DIAGNOSIS — R05 Cough: Secondary | ICD-10-CM | POA: Insufficient documentation

## 2013-09-16 DIAGNOSIS — I059 Rheumatic mitral valve disease, unspecified: Secondary | ICD-10-CM

## 2013-09-16 DIAGNOSIS — I340 Nonrheumatic mitral (valve) insufficiency: Secondary | ICD-10-CM

## 2013-09-16 DIAGNOSIS — R0989 Other specified symptoms and signs involving the circulatory and respiratory systems: Principal | ICD-10-CM | POA: Insufficient documentation

## 2013-09-16 DIAGNOSIS — Z954 Presence of other heart-valve replacement: Secondary | ICD-10-CM | POA: Insufficient documentation

## 2013-09-16 DIAGNOSIS — I2789 Other specified pulmonary heart diseases: Secondary | ICD-10-CM

## 2013-09-16 DIAGNOSIS — Z6841 Body Mass Index (BMI) 40.0 and over, adult: Secondary | ICD-10-CM

## 2013-09-16 DIAGNOSIS — J988 Other specified respiratory disorders: Secondary | ICD-10-CM | POA: Insufficient documentation

## 2013-09-16 DIAGNOSIS — R0609 Other forms of dyspnea: Secondary | ICD-10-CM | POA: Insufficient documentation

## 2013-09-16 DIAGNOSIS — I509 Heart failure, unspecified: Secondary | ICD-10-CM

## 2013-09-16 DIAGNOSIS — R059 Cough, unspecified: Secondary | ICD-10-CM | POA: Insufficient documentation

## 2013-09-16 HISTORY — DX: Morbid (severe) obesity due to excess calories: E66.01

## 2013-09-16 MED ORDER — FUROSEMIDE 40 MG PO TABS: 40.0000 mg | ORAL_TABLET | Freq: Every day | ORAL | Status: DC

## 2013-09-16 MED ORDER — AMIODARONE HCL 200 MG PO TABS: 200.0000 mg | ORAL_TABLET | Freq: Two times a day (BID) | ORAL | Status: DC

## 2013-09-16 MED ORDER — ALBUTEROL SULFATE (2.5 MG/3ML) 0.083% IN NEBU
2.5000 mg | INHALATION_SOLUTION | Freq: Once | RESPIRATORY_TRACT | Status: AC
  Administered 2013-09-16: 2.5 mg via RESPIRATORY_TRACT

## 2013-09-16 NOTE — Progress Notes (Signed)
[address]       [name] [address]             [phone]     CARDIOTHORACIC SURGERY OFFICE NOTE  Referring Provider is Lesleigh Noe, MD PCP is Domingo Sep, MD   HPI:  Patient returns for followup of severe symptomatic mitral regurgitation. 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  I saw her in consultation she remained in the hospital on 09/13/2013. She was discharged over the weekend and returns to our office with her entire family present to discuss treatment options further in the office today. She reports that since she was discharged from the hospital she has felt well and she specifically denies any symptoms of shortness of breath or chest discomfort. The remainder of her review of systems is unchanged from previously.   Current Outpatient Prescriptions  Medication Sig Dispense Refill  . Ferrous Sulfate (FER-IRON PO) Take 80 mg by mouth 2 (two) times daily.      . furosemide (LASIX) 40 MG tablet Take 1 tablet (40 mg total) by mouth as needed (Shortness of breath).  15 tablet  0  . levothyroxine (SYNTHROID, LEVOTHROID) 75 MCG tablet Take 75 mcg by mouth daily before breakfast.       No current facility-administered medications for this visit.      Physical Exam:   BP 154/93  Pulse 79  Resp 18  Ht 5\' 2"  (1.575 m)  Wt 262 lb (118.842 kg)  BMI 47.91 kg/m2  SpO2 99%  LMP 08/22/2013  General:  Morbidly obese but well-appearing  Chest:   Clear to auscultation  CV:   Regular rate and rhythm with holosystolic murmur  Incisions:  n/a  Abdomen:  Soft and nontender  Extremities:  Warm and well-perfused  Diagnostic Tests:  n/a   Impression:  Patient is a 28 year old morbidly obese African American female who presents with severe symptomatic mitral regurgitation associated with acute exacerbation of likely chronic combined systolic and diastolic  congestive heart failure. I have personally reviewed the patient's transthoracic and transesophageal echocardiograms. For the most part there appears to be normal leaflet motion with type I dysfunction of the mitral valve, although there may be some significant restriction of a portion of the posterior leaflet (type IIIa). Overall I am still optimistic that her valve will be repairable. Left ventricular function is mildly reduced and there is moderate to severe pulmonary hypertension. Risks associated with surgery should still be fairly low given the patient's young age, although risks will be somewhat increased because of her morbid obesity.  The patient also has significant iron deficient anemia at baseline which has been attributed to bleeding from menstrual cycles. The patient reports this has been under somewhat improved control since she has been under medical treatment for hypothyroidism.  Her thyroid hormone levels measured during her hospitalization last week were all within normal limits.   Plan:  The patient and her family were counseled at length regarding the indications, risks and potential benefits of surgery. Alternative treatment strategies were discussed. The rationale for elective mitral valve repair surgery has been explained, including a comparison between surgery and continued medical therapy with close follow-up. The likelihood of successful and durable valve repair has been discussed with particular reference to the findings of their recent echocardiogram. Based upon these findings and previous experience, I have quoted them a greater than 90 percent likelihood  of successful valve repair. In the unlikely event that their valve cannot be successfully repaired, we discussed the possibility of replacing the mitral valve using a mechanical prosthesis with the attendant need for long-term anticoagulation versus the alternative of replacing it using a bioprosthetic tissue valve with its  potential for late structural valve deterioration and failure, depending upon the patient's longevity. The importance of this decision in a woman of childbearing years has been discussed. Alternative surgical approaches have been discussed including a comparison between conventional sternotomy and minimally-invasive techniques. The patient's father asked about catheter-based treatment options, and this was discussed.   The relative risks and benefits of each have been reviewed as they pertain to the patient's specific circumstances, and all of their questions have been addressed.  Specific risks potentially related to the minimally-invasive approach were discussed at length, including but not limited to risk of conversion to full or partial sternotomy, aortic dissection or other major vascular complication, unilateral acute lung injury or pulmonary edema, phrenic nerve dysfunction or paralysis, rib fracture, chronic pain, lung hernia, or lymphocele.  They understand and accept all potential risks of surgery including but not limited to risk of death, stroke or other neurologic complication, myocardial infarction, congestive heart failure, respiratory failure, renal failure, bleeding requiring transfusion and/or reexploration, arrhythmia, infection or other wound complications, pneumonia, pleural and/or pericardial effusion, pulmonary embolus, aortic dissection or other major vascular complication, or delayed complications related to valve repair or replacement including but not limited to structural valve deterioration and failure, thrombosis, embolization, endocarditis, or paravalvular leak.  All of their questions have been answered.  We tentatively plan to proceed with surgery on Thursday 09/19/2013.  The patient has been given a prescription for amiodarone to begin today to decrease her risk of perioperative atrial arrhythmias. On the morning of surgery she has been instructed to take only amiodarone and her  thyroid pill with a sip of water. She will contact us later today once she has definitively made up her mind what type of valve she would like to have for mitral valve replaced with in the event that satisfactory repair is not feasible.  She and her family decided to discuss this further amongst themselves.    Salvatore Decentlarence H. Cornelius Moraswen, MD 09/16/2013 11:52 AM

## 2013-09-16 NOTE — Patient Instructions (Signed)
Call to confirm plan related to type of valve to be implanted if your valve cannot be successfully repaired.  Continue taking lasix and iron daily through Wednesday  Begin taking amiodarone today   On the morning of surgery take amiodarone and your thyroid medicine with a sip of water

## 2013-09-17 ENCOUNTER — Encounter (HOSPITAL_COMMUNITY)
Admission: RE | Admit: 2013-09-17 | Discharge: 2013-09-17 | Disposition: A | Discharge status: Home / Self Care | Payer: BC Managed Care – PPO | Source: Ambulatory Visit | Attending: Thoracic Surgery (Cardiothoracic Vascular Surgery) | Admitting: Thoracic Surgery (Cardiothoracic Vascular Surgery)

## 2013-09-17 ENCOUNTER — Ambulatory Visit (HOSPITAL_COMMUNITY)
Admission: RE | Admit: 2013-09-17 | Discharge: 2013-09-17 | Disposition: A | Discharge status: Home / Self Care | Payer: BC Managed Care – PPO | Source: Ambulatory Visit | Attending: Thoracic Surgery (Cardiothoracic Vascular Surgery) | Admitting: Thoracic Surgery (Cardiothoracic Vascular Surgery)

## 2013-09-17 ENCOUNTER — Encounter (HOSPITAL_COMMUNITY): Payer: Self-pay

## 2013-09-17 ENCOUNTER — Other Ambulatory Visit (HOSPITAL_COMMUNITY): Payer: Self-pay | Admitting: *Deleted

## 2013-09-17 VITALS — BP 130/81 | HR 87 | Temp 98.6°F | Resp 18 | Ht 62.0 in | Wt 261.1 lb

## 2013-09-17 DIAGNOSIS — Z01812 Encounter for preprocedural laboratory examination: Secondary | ICD-10-CM

## 2013-09-17 DIAGNOSIS — I059 Rheumatic mitral valve disease, unspecified: Secondary | ICD-10-CM

## 2013-09-17 HISTORY — DX: Shortness of breath: R06.02

## 2013-09-17 HISTORY — DX: Personal history of other specified conditions: Z87.898

## 2013-09-17 HISTORY — DX: Headache: R51

## 2013-09-17 LAB — APTT: APTT: 28 s (ref 24–37)

## 2013-09-17 LAB — URINALYSIS, ROUTINE W REFLEX MICROSCOPIC
BILIRUBIN URINE: NEGATIVE
Glucose, UA: NEGATIVE mg/dL
Hgb urine dipstick: NEGATIVE
KETONES UR: NEGATIVE mg/dL
Leukocytes, UA: NEGATIVE
Nitrite: NEGATIVE
PROTEIN: NEGATIVE mg/dL
Specific Gravity, Urine: 1.011 (ref 1.005–1.030)
Urobilinogen, UA: 0.2 mg/dL (ref 0.0–1.0)
pH: 5.5 (ref 5.0–8.0)

## 2013-09-17 LAB — SURGICAL PCR SCREEN
MRSA, PCR: NEGATIVE
STAPHYLOCOCCUS AUREUS: NEGATIVE

## 2013-09-17 LAB — COMPREHENSIVE METABOLIC PANEL
ALBUMIN: 3.8 g/dL (ref 3.5–5.2)
ALK PHOS: 46 U/L (ref 39–117)
ALT: 14 U/L (ref 0–35)
AST: 16 U/L (ref 0–37)
BUN: 16 mg/dL (ref 6–23)
CO2: 21 mEq/L (ref 19–32)
Calcium: 9.1 mg/dL (ref 8.4–10.5)
Chloride: 102 mEq/L (ref 96–112)
Creatinine, Ser: 1.01 mg/dL (ref 0.50–1.10)
GFR calc non Af Amer: 76 mL/min — ABNORMAL LOW (ref >90)
GFR, EST AFRICAN AMERICAN: 88 mL/min — AB (ref >90)
GLUCOSE: 91 mg/dL (ref 70–99)
POTASSIUM: 4 meq/L (ref 3.7–5.3)
Sodium: 139 mEq/L (ref 137–147)
Total Bilirubin: 0.5 mg/dL (ref 0.3–1.2)
Total Protein: 7.5 g/dL (ref 6.0–8.3)

## 2013-09-17 LAB — CBC
HCT: 32.7 % — ABNORMAL LOW (ref 36.0–46.0)
HEMOGLOBIN: 10.4 g/dL — AB (ref 12.0–15.0)
MCH: 23.2 pg — ABNORMAL LOW (ref 26.0–34.0)
MCHC: 31.8 g/dL (ref 30.0–36.0)
MCV: 72.8 fL — ABNORMAL LOW (ref 78.0–100.0)
Platelets: 302 10*3/uL (ref 150–400)
RBC: 4.49 MIL/uL (ref 3.87–5.11)
RDW: 18.4 % — ABNORMAL HIGH (ref 11.5–15.5)
WBC: 6.3 10*3/uL (ref 4.0–10.5)

## 2013-09-17 LAB — PROTIME-INR
INR: 1.02 (ref 0.00–1.49)
Prothrombin Time: 13.2 seconds (ref 11.6–15.2)

## 2013-09-17 LAB — ABO/RH: ABO/RH(D): AB POS

## 2013-09-17 LAB — HEMOGLOBIN A1C
Hgb A1c MFr Bld: 5.5 % (ref <5.7)
Mean Plasma Glucose: 111 mg/dL (ref <117)

## 2013-09-17 LAB — HCG, SERUM, QUALITATIVE: Preg, Serum: NEGATIVE

## 2013-09-17 MED ORDER — CHLORHEXIDINE GLUCONATE 4 % EX LIQD: 30.0000 mL | CUTANEOUS | Status: DC

## 2013-09-17 NOTE — Progress Notes (Signed)
Pt had a 9:00 am PAT appt. Not here by 9:15 am. Called pt and she thought she could "come anytime". I asked her what time she thought she could get here and she stated 10:30 am. I told her to be here by then to be seen.

## 2013-09-17 NOTE — Pre-Procedure Instructions (Signed)
Paula Wolfe  09/17/2013   Your procedure is scheduled on:  Thursday, September 19, 2013 at 7:30 AM.   Report to [facility] Entrance "A" Admitting Office at 5:30 AM.   Call this number if you have problems the morning of surgery: [phone]   Remember:   Do not eat food or drink liquids after midnight Wednesday, 09/18/13.   Take these medicines the morning of surgery with A SIP OF WATER: amiodarone (PACERONE), levothyroxine (SYNTHROID, LEVOTHROID)   Do not wear jewelry, make-up or nail polish.  Do not wear lotions, powders, or perfumes. You may wear deodorant.  Do not shave 48 hours prior to surgery.  Do not bring valuables to the hospital.  [facility] is not responsible                  for any belongings or valuables.               Contacts, dentures or bridgework may not be worn into surgery.  Leave suitcase in the car. After surgery it may be brought to your room.  For patients admitted to the hospital, discharge time is determined by your                treatment team.            Special Instructions:  - Preparing for Surgery  Before surgery, you can play an important role.  Because skin is not sterile, your skin needs to be as free of germs as possible.  You can reduce the number of germs on you skin by washing with CHG (chlorahexidine gluconate) soap before surgery.  CHG is an antiseptic cleaner which kills germs and bonds with the skin to continue killing germs even after washing.  Please DO NOT use if you have an allergy to CHG or antibacterial soaps.  If your skin becomes reddened/irritated stop using the CHG and inform your nurse when you arrive at Short Stay.  Do not shave (including legs and underarms) for at least 48 hours prior to the first CHG shower.  You may shave your face.  Please follow these instructions carefully:   1.  Shower with CHG Soap the night before surgery and the                                morning of Surgery.  2.  If you  choose to wash your hair, wash your hair first as usual with your       normal shampoo.  3.  After you shampoo, rinse your hair and body thoroughly to remove the                      Shampoo.  4.  Use CHG as you would any other liquid soap.  You can apply chg directly       to the skin and wash gently with scrungie or a clean washcloth.  5.  Apply the CHG Soap to your body ONLY FROM THE NECK DOWN.        Do not use on open wounds or open sores.  Avoid contact with your eyes, ears, mouth and genitals (private parts).  Wash genitals (private parts) with your normal soap.  6.  Wash thoroughly, paying special attention to the area where your surgery        will be performed.  7.  Thoroughly rinse  your body with warm water from the neck down.  8.  DO NOT shower/wash with your normal soap after using and rinsing off       the CHG Soap.  9.  Pat yourself dry with a clean towel.            10.  Wear clean pajamas.            11.  Place clean sheets on your bed the night of your first shower and do not        sleep with pets. Day of Surgery  Do not apply any lotions/deoderants the morning of surgery.  Please wear clean clothes to the hospital/surgery center.   Please read over the following fact sheets that you were given: Pain Booklet, Coughing and Deep Breathing, Blood Transfusion Information, Open Heart Packet, MRSA Information and Surgical Site Infection Prevention

## 2013-09-17 NOTE — Progress Notes (Signed)
ABG's will need to be drawn day of surgery, unable to get a good specimen day of PAT.

## 2013-09-17 NOTE — Pre-Procedure Instructions (Signed)
Paula Wolfe  09/17/2013   Your procedure is scheduled on:  Thursday, September 19, 2013 at 7:30 AM.   Report to [facility] Entrance "A" Admitting Office at 5:30 AM.   Call this number if you have problems the morning of surgery: [phone]   Remember:   Do not eat food or drink liquids after midnight Wednesday, 09/18/13.   Take these medicines the morning of surgery with A SIP OF WATER: amiodarone (PACERONE), levothyroxine (SYNTHROID, LEVOTHROID)   Do not wear jewelry, make-up or nail polish.  Do not wear lotions, powders, or perfumes. You may wear deodorant.  Do not shave 48 hours prior to surgery.  Do not bring valuables to the hospital.  [facility] is not responsible                  for any belongings or valuables.               Contacts, dentures or bridgework may not be worn into surgery.  Leave suitcase in the car. After surgery it may be brought to your room.  For patients admitted to the hospital, discharge time is determined by your                treatment team.            Special Instructions: [facility] - Preparing for Surgery  Before surgery, you can play an important role.  Because skin is not sterile, your skin needs to be as free of germs as possible.  You can reduce the number of germs on you skin by washing with CHG (chlorahexidine gluconate) soap before surgery.  CHG is an antiseptic cleaner which kills germs and bonds with the skin to continue killing germs even after washing.  Please DO NOT use if you have an allergy to CHG or antibacterial soaps.  If your skin becomes reddened/irritated stop using the CHG and inform your nurse when you arrive at Short Stay.  Do not shave (including legs and underarms) for at least 48 hours prior to the first CHG shower.  You may shave your face.  Please follow these instructions carefully:   1.  Shower with CHG Soap the night before surgery and the                                morning of Surgery.  2.  If you  choose to wash your hair, wash your hair first as usual with your       normal shampoo.  3.  After you shampoo, rinse your hair and body thoroughly to remove the                      Shampoo.  4.  Use CHG as you would any other liquid soap.  You can apply chg directly       to the skin and wash gently with scrungie or a clean washcloth.  5.  Apply the CHG Soap to your body ONLY FROM THE NECK DOWN.        Do not use on open wounds or open sores.  Avoid contact with your eyes, ears, mouth and genitals (private parts).  Wash genitals (private parts) with your normal soap.  6.  Wash thoroughly, paying special attention to the area where your surgery        will be performed.  7.  Thoroughly rinse   your body with warm water from the neck down.  8.  DO NOT shower/wash with your normal soap after using and rinsing off       the CHG Soap.  9.  Pat yourself dry with a clean towel.            10.  Wear clean pajamas.            11.  Place clean sheets on your bed the night of your first shower and do not        sleep with pets. Day of Surgery  Do not apply any lotions/deoderants the morning of surgery.  Please wear clean clothes to the hospital/surgery center.   Please read over the following fact sheets that you were given: Pain Booklet, Coughing and Deep Breathing, Blood Transfusion Information, Open Heart Packet, MRSA Information and Surgical Site Infection Prevention    

## 2013-09-18 MED ORDER — AMINOCAPROIC ACID 250 MG/ML IV SOLN
INTRAVENOUS | Status: AC
  Administered 2013-09-19: 69 mL/h via INTRAVENOUS
  Filled 2013-09-18: qty 40

## 2013-09-18 MED ORDER — DEXMEDETOMIDINE HCL IN NACL 400 MCG/100ML IV SOLN
0.1000 ug/kg/h | INTRAVENOUS | Status: AC
  Administered 2013-09-19: 0.2 ug/kg/h via INTRAVENOUS
  Filled 2013-09-18: qty 100

## 2013-09-18 MED ORDER — METOPROLOL TARTRATE 12.5 MG HALF TABLET
12.5000 mg | ORAL_TABLET | Freq: Once | ORAL | Status: AC
  Administered 2013-09-19: 12.5 mg via ORAL

## 2013-09-18 MED ORDER — PHENYLEPHRINE HCL 10 MG/ML IJ SOLN
30.0000 ug/min | INTRAVENOUS | Status: AC
  Administered 2013-09-19: 50 ug/min via INTRAVENOUS
  Filled 2013-09-18: qty 2

## 2013-09-18 MED ORDER — POTASSIUM CHLORIDE 2 MEQ/ML IV SOLN
80.0000 meq | INTRAVENOUS | Status: DC
  Filled 2013-09-18: qty 40

## 2013-09-18 MED ORDER — DOPAMINE-DEXTROSE 3.2-5 MG/ML-% IV SOLN
2.0000 ug/kg/min | INTRAVENOUS | Status: DC
  Filled 2013-09-18: qty 250

## 2013-09-18 MED ORDER — VANCOMYCIN HCL 10 G IV SOLR
1500.0000 mg | INTRAVENOUS | Status: AC
  Administered 2013-09-19: 1500 mg via INTRAVENOUS
  Filled 2013-09-18 (×2): qty 1500

## 2013-09-18 MED ORDER — CEFUROXIME SODIUM 1.5 G IJ SOLR
1.5000 g | INTRAMUSCULAR | Status: AC
  Administered 2013-09-19: .75 g via INTRAVENOUS
  Administered 2013-09-19: 1.5 g via INTRAVENOUS
  Filled 2013-09-18 (×2): qty 1.5

## 2013-09-18 MED ORDER — INSULIN REGULAR HUMAN 100 UNIT/ML IJ SOLN
INTRAMUSCULAR | Status: AC
  Administered 2013-09-19: 2 [IU]/h via INTRAVENOUS
  Filled 2013-09-18: qty 1

## 2013-09-18 MED ORDER — DEXTROSE 5 % IV SOLN
750.0000 mg | INTRAVENOUS | Status: DC
  Filled 2013-09-18: qty 750

## 2013-09-18 MED ORDER — VANCOMYCIN HCL 1000 MG IV SOLR
INTRAVENOUS | Status: AC
  Administered 2013-09-19: 10:00:00
  Filled 2013-09-18: qty 1000

## 2013-09-18 MED ORDER — PLASMA-LYTE 148 IV SOLN
INTRAVENOUS | Status: DC
  Filled 2013-09-18: qty 2.5

## 2013-09-18 MED ORDER — EPINEPHRINE HCL 1 MG/ML IJ SOLN
0.5000 ug/min | INTRAVENOUS | Status: DC
  Filled 2013-09-18: qty 4

## 2013-09-18 MED ORDER — NITROGLYCERIN IN D5W 200-5 MCG/ML-% IV SOLN
2.0000 ug/min | INTRAVENOUS | Status: DC
  Filled 2013-09-18: qty 250

## 2013-09-18 MED ORDER — SODIUM CHLORIDE 0.9 % IV SOLN
INTRAVENOUS | Status: DC
  Filled 2013-09-18: qty 30

## 2013-09-18 MED ORDER — MAGNESIUM SULFATE 50 % IJ SOLN
40.0000 meq | INTRAMUSCULAR | Status: DC
  Filled 2013-09-18: qty 10

## 2013-09-18 NOTE — H&P (Signed)
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 The patient and her parents state that she was told she had a heart murmur and leaky heart valve during childhood. There is no history of rheumatic fever. She was never formally evaluated by a cardiologist. The patient states that for several months she has experienced vague tightness across her chest with exertion and worsening orthopnea. She developed somewhat sudden onset of resting shortness of breath prompting presentation to the emergency department 09/12/2013.  She tried to relax but her breathing got worse and became associated with cough producing pink, frothy secretions, ultimately causing her to call EMS.  In the ED she underwent CT angiogram of the chest which ruled out pulmonary embolus but was notable for the presence of interstitial pulmonary edema. A transthoracic echocardiogram was performed demonstrating severe mitral regurgitation with mild left ventricular systolic dysfunction. Cardiology was consulted.  She subsequently underwent transesophageal echocardiogram which confirmed the presence of severe mitral regurgitation and mild left ventricular dysfunction. Left and right heart catheterization was performed revealing normal coronary artery anatomy with no significant coronary artery disease.  There was moderate to severe pulmonary hypertension with large V waves seen on pulmonary capillary wedge tracing.  Cardiothoracic surgical consultation was requested.  I saw her in consultation she remained in the hospital on 09/13/2013. She  was discharged over the weekend and returns to our office with her entire family present to discuss treatment options further in the office today. She reports that since she was discharged from the hospital she has felt well and she specifically denies any symptoms of shortness of breath or chest discomfort. The remainder of her review of systems is unchanged from previously.  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  Her parents and family live in [place].  She has been overweight most of her life. She has lost nearly 30 pounds in weight over the past 6 months using a diet and exercise program intended to get in better shape.  She states that in retrospect she's been having some tightness across her chest with exertion off and on for a long time. The tightness and shortness of breath are made worse by lying flat in bed, and she has had several episodes of PND in recent months. She has had frequent tachypalpitations without disease spells or syncope. She's had lower extremity edema, worse on the left than the right.    Past Medical History  Diagnosis Date  . Hypothyroidism   . Nontoxic multinodular goiter     a. Pt reports prior ultrasound/bx that were negative but was told she would need to keep an eye on them in the future. Benign FNA per [facility] for this dx.  . Menometrorrhagia     a. Improved after tx of hypothyroidism, but recurred 05/2013.  . Broken foot     a. Remote broken foot with residual chronic LLE edema.  . Acute on chronic combined systolic and diastolic heart failure 09/12/2013  .  Mitral regurgitation 09/12/2013    severe by TEE  . Multinodular goiter   . Pulmonary hypertension 09/13/2013  . Obesity   . Anemia     iron deficient, attributed to menometrorrhagia  . Chronic combined systolic and diastolic CHF (congestive heart failure)   . Morbid obesity with BMI of 45.0-49.9, adult 09/16/2013  . H/O syncope     prior to  finding out about the mitral regurgitation  . Shortness of breath     sometimes with exertion  . Headache(784.0)     headaches    Past Surgical History  Procedure Laterality Date  . External ear surgery      Family History  Problem Relation Age of Onset  . Hypertension Mother   . Hypertension Sister   . Hypertension Father   . Heart Problems Maternal Grandfather     details unclear    Social History History  Substance Use Topics  . Smoking status: Never Smoker   . Smokeless tobacco: Never Used  . Alcohol Use: Yes     Comment: occasionally - 1-2x/month    Prior to Admission medications   Medication Sig Start Date End Date Taking? Authorizing Provider  amiodarone (PACERONE) 200 MG tablet Take 1 tablet (200 mg total) by mouth 2 (two) times daily. Begin 7 days prior to surgery. 09/16/13  Yes Paula Nails, MD  Ferrous Sulfate (FER-IRON PO) Take 80 mg by mouth 2 (two) times daily.   Yes Historical Provider, MD  levothyroxine (SYNTHROID, LEVOTHROID) 75 MCG tablet Take 75 mcg by mouth daily before breakfast.   Yes Historical Provider, MD  furosemide (LASIX) 40 MG tablet Take 1 tablet (40 mg total) by mouth daily. Do not fill if this has already been filled today...jcarter, r.n. 09/16/13   Paula Nails, MD    No Known Allergies  Review of Systems:              General:                      normal appetite, normal energy, no weight gain, + weight loss, no fever             Cardiac:                      + chest pain with exertion, + chest pain at rest, + SOB with exertion, + resting SOB, + PND, + orthopnea, + palpitations, no arrhythmia, no atrial fibrillation, + LE edema, no dizzy spells, no syncope             Respiratory:                + shortness of breath, no home oxygen, no productive cough, + dry cough, no bronchitis, no wheezing, no hemoptysis, no asthma, no pain with inspiration or cough, no sleep apnea, no CPAP at night             GI:                                no  difficulty swallowing, no reflux, no frequent heartburn, no hiatal hernia, no abdominal pain, no constipation, no diarrhea, no hematochezia, no hematemesis, no melena             GU:  no dysuria,  no frequency, no urinary tract infection, no hematuria, no kidney stones, no kidney disease             Vascular:                     no pain suggestive of claudication, no pain in feet, no leg cramps, no varicose veins, no DVT, no non-healing foot ulcer             Neuro:                         no stroke, no TIA's, no seizures, no headaches, no temporary blindness one eye,  no slurred speech, no peripheral neuropathy, no chronic pain, no instability of gait, no memory/cognitive dysfunction             Musculoskeletal:         no arthritis, no joint swelling, no myalgias, no difficulty walking, normal mobility               Skin:                            no rash, no itching, no skin infections, no pressure sores or ulcerations             Psych:                         no anxiety, no depression, no nervousness, no unusual recent stress             Eyes:                           no blurry vision, no floaters, no recent vision changes, does not wears glasses or contacts             ENT:                            no hearing loss, no loose or painful teeth, no dentures, last saw dentist within the last year             Hematologic:               no easy bruising, no abnormal bleeding, no clotting disorder, no frequent epistaxis             Endocrine:                   no diabetes, does not check CBG's at home                           Physical Exam:  LMP 08/22/2013             BP 139/95  Pulse 64  Temp(Src) 98 F (36.7 C) (Oral)  Resp 18  Ht 5\' 2"  (1.575 m)  Wt 119.024 kg (262 lb 6.4 oz)  BMI 47.98 kg/m2  SpO2 98%  LMP 08/22/2013             General:                      Obese but o/w  well-appearing             HEENT:  Unremarkable                Neck:                           no JVD, no bruits, no adenopathy               Chest:                         clear to auscultation, symmetrical breath sounds, no wheezes, no rhonchi               CV:                              RRR, grade III/VI holosystolic murmur               Abdomen:                    soft, non-tender, no masses               Extremities:                 warm, well-perfused, pulses palpable             Rectal/GU                   Deferred             Neuro:                         Grossly non-focal and symmetrical throughout             Skin:                            Clean and dry, no rashes, no breakdown  Diagnostic Tests:  Transthoracic Echocardiography  Patient: Paula Wolfe, Paula Wolfe MR #: [id number] Study Date: 09/12/2013 Gender: F Age: 49 Height: 157.5cm Weight: 120.2kg BSA: 2.109m^2 Pt. Status: Room: OTFC  PERFORMING W. Viann Fish, MD [facility] SONOGRAPHER Cathie Beams ATTENDING Philip Aspen, Estela ADMITTING Lynden Oxford ORDERING Patel, Pranav REFERRING Lynden Oxford cc:  ------------------------------------------------------------ LV EF: 50% - 55%  ------------------------------------------------------------ Indications: CHF - 428.0.  ------------------------------------------------------------ History: PMH: Obesity. Atypical pneumonia. Respiratory distress. Dyspnea.  ------------------------------------------------------------ Study Conclusions  - Left ventricle: The cavity size was mildly dilated. Systolic function was normal. The estimated ejection fraction was in the range of 50% to 55%. Wall motion was normal; there were no regional wall motion abnormalities. - Mitral valve: Severe regurgitation. - Left atrium: The atrium was severely dilated. - Pulmonary arteries: Systolic pressure was mildly increased. PA peak pressure: 35mm Hg (S). Transthoracic echocardiography. M-mode, complete 2D, spectral Doppler, and color  Doppler. Height: Height: 157.5cm. Height: 62in. Weight: Weight: 120.2kg. Weight: 264.4lb. Body mass index: BMI: 48.5kg/m^2. Body surface area: BSA: 2.84m^2. Blood pressure: 148/89. Patient status: Inpatient. Location: Echo laboratory.  ------------------------------------------------------------  ------------------------------------------------------------ Left ventricle: The cavity size was mildly dilated. Systolic function was normal. The estimated ejection fraction was in the range of 50% to 55%. Wall motion was normal; there were no regional wall motion abnormalities.  ------------------------------------------------------------ Aortic valve: Trileaflet; normal thickness leaflets. Mobility was not restricted. Doppler: Transvalvular velocity was within the normal range. There was no stenosis. No regurgitation.  ------------------------------------------------------------ Aorta: Aortic root: The aortic root  was normal in size.  ------------------------------------------------------------ Mitral valve: Structurally normal valve. Mobility was not restricted. Doppler: Transvalvular velocity was within the normal range. There was no evidence for stenosis. Severe regurgitation. Peak gradient: 41mm Hg (D).  ------------------------------------------------------------ Left atrium: The atrium was severely dilated.  ------------------------------------------------------------ Right ventricle: The cavity size was normal. Wall thickness was normal. Systolic function was normal.  ------------------------------------------------------------ Pulmonic valve: Not well visualized. The valve appears to be grossly normal. Doppler: Transvalvular velocity was within the normal range. There was no evidence for stenosis.  ------------------------------------------------------------ Tricuspid valve: Structurally normal valve. Doppler: Transvalvular velocity was within the normal range.  Mild regurgitation.  ------------------------------------------------------------ Pulmonary artery: The main pulmonary artery was normal-sized. Systolic pressure was mildly increased.  ------------------------------------------------------------ Right atrium: The atrium was normal in size.  ------------------------------------------------------------ Pericardium: There was no pericardial effusion.  ------------------------------------------------------------ Systemic veins: Inferior vena cava: Not well visualized.  ------------------------------------------------------------  2D measurements Normal Doppler measurements Normal Left ventricle Main pulmonary LVID ED, 58.4 mm 43-52 artery chord, Pressure, S 35 mm =30 PLAX Hg LVID ES, 46.8 mm 23-38 Left ventricle chord, Ea, lat ann, 13. cm/s ------ PLAX tiss DP 8 FS, chord, 20 % >29 E/Ea, lat 8.7 ------ PLAX ann, tiss DP 7 LVPW, ED 12.4 mm ------ Ea, med ann, 12. cm/s ------ IVS/LVPW 0.81 <1.3 tiss DP 9 ratio, ED E/Ea, med 9.3 ------ Ventricular septum ann, tiss DP 8 IVS, ED 10.1 mm ------ Mitral valve LVOT Peak E vel 121 cm/s ------ Diam, S 20 mm ------ Peak A vel 62. cm/s ------ Area 3.14 cm^2 ------ 1 Aorta Deceleration 158 ms 150-23 Root diam, 29 mm ------ time 0 ED Peak 6 mm ------ Left atrium gradient, D Hg AP dim 38 mm ------ Peak E/A 1.9 ------ AP dim 1.6 cm/m^2 <2.2 ratio index Regurg alias 39. cm/s ------ vel, PISA 7 Regurg 6 mm ------ radius, PISA Max regurg 560 cm/s ------ vel Regurg VTI 178 cm ------ ERO, PISA 0.1 cm^2 ------ 6 Regurg vol, 28 ml ------ PISA Tricuspid valve Regurg peak 281 cm/s ------ vel Peak RV-RA 32 mm ------ gradient, S Hg Max regurg 281 cm/s ------ vel Systemic veins Estimated CVP 3 mm ------ Hg Right ventricle Pressure, S 35 mm <30 Hg Sa vel, lat 13. cm/s ------ ann, tiss DP 2  ------------------------------------------------------------ Prepared and Electronically  Authenticated by  Ellwood Handler 2015-01-29T13:15:31.140    Transesophageal Echocardiography  Patient: Eudell, Holzknecht MR #: [id number] Study Date: 09/13/2013 Gender: F Age: 89 Height: 157.5cm Weight: 119.1kg BSA: 2.81m^2 Pt. Status: Room: 3W01C  PERFORMING Dietrich Pates ATTENDING Philip Aspen, Estela SONOGRAPHER Dewitt Hoes, RDCS ORDERING Dunn, Dayna REFERRING Dunn, Dayna ADMITTING Allena Katz, Edsel Petrin cc:  ------------------------------------------------------------  ------------------------------------------------------------ Indications: Mitral regurgitation 424.0. Transesophageal echocardiography. 2D and color Doppler. Height: Height: 157.5cm. Height: 62in. Weight: Weight: 119.1kg. Weight: 262lb. Body mass index: BMI: 48kg/m^2. Body surface area: BSA: 2.44m^2. Blood pressure: 50/7. Patient status: Inpatient. Location: Catheterization laboratory.  ------------------------------------------------------------  ------------------------------------------------------------ Left ventricle: LVEF is approximately 50%  ------------------------------------------------------------ Aortic valve: Structurally normal valve. Cusp separation was normal. Doppler: No regurgitation.  ------------------------------------------------------------ Aorta: Descending aorta not well visualized. Difficult to follow.  ------------------------------------------------------------ Mitral valve: MV is mildly thickened. There is very mild prolapse of the anterior mitral leaflet. The posterior leaflet is shorter. MR is severe, directed posterior around back wall of L atrium. Using PISA though, MR volume is caculated at 48 cc.  ------------------------------------------------------------ Left atrium: No evidence of thrombus in the atrial cavity or appendage.  ------------------------------------------------------------ Atrial septum: Interatrial septum bulges to  R atrium consistent  with increased L atrial pressure.  ------------------------------------------------------------ Pulmonic valve: PV is normal.  ------------------------------------------------------------ Tricuspid valve: TV is normal Mild TR.  ------------------------------------------------------------ Post procedure conclusions Ascending Aorta:  - Descending aorta not well visualized. Difficult to follow.  ------------------------------------------------------------  Doppler measurements Normal Mitral valve Regurg 38.5 cm/s ------ alias vel, PISA Max regurg 550 cm/s ------ vel Regurg VTI 173 cm ------ ERO, PISA 0.28 cm^2 ------ Regurg 48 ml ------ vol, PISA  ------------------------------------------------------------ Prepared and Electronically Authenticated by  Dietrich Pates 2015-01-30T18:21:51.740     Left and Right Heart Catheterization with Coronary Angiography Report   Paula Wolfe   28 y.o.   female   [birth year]   Procedure Date:09/13/2013   Referring Physician: Eden Emms, M.D.   Primary Cardiologist:: Burna Forts, MD   INDICATIONS: Severe mitral regurgitation   PROCEDURE: 1. Left heart catheterization; 2. Right heart catheterization; 3. Left ventriculography by power injection;4. Coronary angiography   CONSENT:   The risks, benefits, and details of the procedure were explained in detail to the patient. Risks including death, stroke, heart attack, kidney injury, allergy, limb ischemia, bleeding and radiation injury were discussed. The patient verbalized understanding and wanted to proceed. Informed written consent was obtained.   PROCEDURE TECHNIQUE: After Xylocaine anesthesia a 5 French Slender sheath was placed in the right radial artery after multiple passes using an Angiocath set. Multiple passes with a straight needle failed to allow passage of a guidewire. A 22-gauge Angiocath was placed in the right antecubital vein. A micro-wire was then advanced into the brachial  vein and a 5 French sheath was placed. Coronary angiography was done using a 5 [place] JR 4 and JL 3.5 cm catheters. Left ventriculography was done using the 5 French angled pigtail catheter and power injection. Right heart pressures were obtained using a 5 French balloon tip catheter. Right atrial, main pulmonary artery, and supravalvular aortic saturation samples were obtained   In the supine position, the patient began experiencing significant dyspnea and chest pressure post procedure. We sat her up on a wedge and gauge IV Lasix 40 mg with dramatic improvement in the symptom over 10-15 minutes.   MEDICATIONS: 1 mg Versed IV and fentanyl 50 mcg  CONTRAST: Total of 130 cc.   COMPLICATIONS: Dyspnea and chest discomfort relieved with sitting the patient up and giving IV Lasix.   HEMODYNAMICS: Aortic pressure 127/99 mmHg; LV pressure 135/22 mmHg; LVEDP 32 mm mercury; RA 16 mm mercury, RV 52/14 mmHg, PA 53/26 mmHg, PCWP(mean) 32 mm mercury mean with a V wave to 52 mm mercury, Cardiac Output 6.06 L/minute with cardiac index of 2.82 L/minute/m2   ANGIOGRAPHIC DATA: The left main coronary artery is normal..  The left anterior descending artery is widely patent, dominant, wrapping around the left ventricular apex. Large diagonal branch arises proximally. No obstruction is noted..   The left circumflex artery is widely patent without evidence of coronary obstruction. 3 obtuse marginal branches arise with the first obtuse marginal being a large branching vessel...   The right coronary artery is dominant and widely patent. There is a large PDA and left ventricular branch. There is also a large acute marginal branch.Marland Kitchen   LEFT VENTRICULOGRAM: Left ventricular angiogram was done in the 30 RAO projection and revealed an enlarged LV, an estimated ejection fraction of 45%. There is mild global hypokinesis. There is moderately severe to severe MR. The left atrium appears to be significantly dilated.   IMPRESSIONS: 1.  Moderately severe to severe mitral regurgitation with  significant left atrial enlargement.   2. Low-normal to mildly depressed LV systolic function with LV enlargement and EF of 45 percent. Patient has acute on chronic combined systolic and diastolic heart failure.   3. Normal coronary arteries.   4. Moderately severe pulmonary hypertension   RECOMMENDATION: TCTS evaluation for consideration of mitral valve repair/replacement.      Impression:  Patient is a 28 year old morbidly obese African American female who presents with severe symptomatic mitral regurgitation associated with acute exacerbation of likely chronic combined systolic and diastolic congestive heart failure. I have personally reviewed the patient's transthoracic and transesophageal echocardiograms. For the most part there appears to be normal leaflet motion with type I dysfunction of the mitral valve, although there may be some significant restriction of a portion of the posterior leaflet (type IIIa). Overall I am still optimistic that her valve will be repairable. Left ventricular function is mildly reduced and there is moderate to severe pulmonary hypertension. Risks associated with surgery should still be fairly low given the patient's young age, although risks will be somewhat increased because of her morbid obesity.  The patient also has significant iron deficient anemia at baseline which has been attributed to bleeding from menstrual cycles. The patient reports this has been under somewhat improved control since she has been under medical treatment for hypothyroidism.  Her thyroid hormone levels measured during her hospitalization last week were all within normal limits.   Plan:  The patient and her family were counseled at length regarding the indications, risks and potential benefits of surgery. Alternative treatment strategies were discussed. The rationale for elective mitral valve repair surgery has been explained, including a  comparison between surgery and continued medical therapy with close follow-up. The likelihood of successful and durable valve repair has been discussed with particular reference to the findings of their recent echocardiogram. Based upon these findings and previous experience, I have quoted them a greater than 90 percent likelihood of successful valve repair. In the unlikely event that their valve cannot be successfully repaired, we discussed the possibility of replacing the mitral valve using a mechanical prosthesis with the attendant need for long-term anticoagulation versus the alternative of replacing it using a bioprosthetic tissue valve with its potential for late structural valve deterioration and failure, depending upon the patient's longevity. The importance of this decision in a woman of childbearing years has been discussed. Alternative surgical approaches have been discussed including a comparison between conventional sternotomy and minimally-invasive techniques. The patient's father asked about catheter-based treatment options, and this was discussed.   The relative risks and benefits of each have been reviewed as they pertain to the patient's specific circumstances, and all of their questions have been addressed.  Specific risks potentially related to the minimally-invasive approach were discussed at length, including but not limited to risk of conversion to full or partial sternotomy, aortic dissection or other major vascular complication, unilateral acute lung injury or pulmonary edema, phrenic nerve dysfunction or paralysis, rib fracture, chronic pain, lung hernia, or lymphocele.  They understand and accept all potential risks of surgery including but not limited to risk of death, stroke or other neurologic complication, myocardial infarction, congestive heart failure, respiratory failure, renal failure, bleeding requiring transfusion and/or reexploration, arrhythmia, infection or other wound  complications, pneumonia, pleural and/or pericardial effusion, pulmonary embolus, aortic dissection or other major vascular complication, or delayed complications related to valve repair or replacement including but not limited to structural valve deterioration and failure, thrombosis, embolization, endocarditis, or paravalvular leak.  All of their questions have been answered.  We tentatively plan to proceed with surgery on Thursday 09/19/2013.  The patient has been given a prescription for amiodarone to begin today to decrease her risk of perioperative atrial arrhythmias. On the morning of surgery she has been instructed to take only amiodarone and her thyroid pill with a sip of water. She will contact us later today once she has definitively made up her mind what type of valve she would like to have for mitral valve replaced with in the event that satisfactory repair is not feasible.  She and her family decided to discuss this further amongst themselves.    Salvatore Decentlarence H. Cornelius Moraswen, MD 09/16/2013 11:52 AM

## 2013-09-19 ENCOUNTER — Encounter (HOSPITAL_COMMUNITY): Payer: BC Managed Care – PPO | Admitting: Anesthesiology

## 2013-09-19 ENCOUNTER — Encounter (HOSPITAL_COMMUNITY): Payer: Self-pay | Admitting: *Deleted

## 2013-09-19 ENCOUNTER — Inpatient Hospital Stay (HOSPITAL_COMMUNITY): Payer: BC Managed Care – PPO | Admitting: Anesthesiology

## 2013-09-19 ENCOUNTER — Inpatient Hospital Stay (HOSPITAL_COMMUNITY)
Admission: RE | Admit: 2013-09-19 | Discharge: 2013-09-25 | DRG: 219 | Disposition: A | Discharge status: Home / Self Care | Payer: BC Managed Care – PPO | Source: Ambulatory Visit | Attending: Thoracic Surgery (Cardiothoracic Vascular Surgery) | Admitting: Thoracic Surgery (Cardiothoracic Vascular Surgery)

## 2013-09-19 ENCOUNTER — Encounter: Payer: Self-pay | Admitting: Thoracic Surgery (Cardiothoracic Vascular Surgery)

## 2013-09-19 ENCOUNTER — Encounter (HOSPITAL_COMMUNITY)
Admission: RE | Disposition: A | Discharge status: Home / Self Care | Payer: BC Managed Care – PPO | Source: Ambulatory Visit | Attending: Thoracic Surgery (Cardiothoracic Vascular Surgery)

## 2013-09-19 ENCOUNTER — Inpatient Hospital Stay (HOSPITAL_COMMUNITY): Payer: BC Managed Care – PPO

## 2013-09-19 DIAGNOSIS — E669 Obesity, unspecified: Secondary | ICD-10-CM | POA: Diagnosis present

## 2013-09-19 DIAGNOSIS — D62 Acute posthemorrhagic anemia: Secondary | ICD-10-CM | POA: Diagnosis not present

## 2013-09-19 DIAGNOSIS — Z8249 Family history of ischemic heart disease and other diseases of the circulatory system: Secondary | ICD-10-CM

## 2013-09-19 DIAGNOSIS — I340 Nonrheumatic mitral (valve) insufficiency: Secondary | ICD-10-CM | POA: Diagnosis present

## 2013-09-19 DIAGNOSIS — I2789 Other specified pulmonary heart diseases: Secondary | ICD-10-CM | POA: Diagnosis present

## 2013-09-19 DIAGNOSIS — E042 Nontoxic multinodular goiter: Secondary | ICD-10-CM | POA: Diagnosis present

## 2013-09-19 DIAGNOSIS — D509 Iron deficiency anemia, unspecified: Secondary | ICD-10-CM | POA: Diagnosis present

## 2013-09-19 DIAGNOSIS — I059 Rheumatic mitral valve disease, unspecified: Principal | ICD-10-CM

## 2013-09-19 DIAGNOSIS — I509 Heart failure, unspecified: Secondary | ICD-10-CM | POA: Diagnosis present

## 2013-09-19 DIAGNOSIS — Z9889 Other specified postprocedural states: Secondary | ICD-10-CM

## 2013-09-19 DIAGNOSIS — I428 Other cardiomyopathies: Secondary | ICD-10-CM | POA: Diagnosis present

## 2013-09-19 DIAGNOSIS — Z79899 Other long term (current) drug therapy: Secondary | ICD-10-CM

## 2013-09-19 DIAGNOSIS — Y834 Other reconstructive surgery as the cause of abnormal reaction of the patient, or of later complication, without mention of misadventure at the time of the procedure: Secondary | ICD-10-CM | POA: Diagnosis not present

## 2013-09-19 DIAGNOSIS — E8779 Other fluid overload: Secondary | ICD-10-CM | POA: Diagnosis present

## 2013-09-19 DIAGNOSIS — E039 Hypothyroidism, unspecified: Secondary | ICD-10-CM | POA: Diagnosis present

## 2013-09-19 DIAGNOSIS — Z7982 Long term (current) use of aspirin: Secondary | ICD-10-CM

## 2013-09-19 DIAGNOSIS — R110 Nausea: Secondary | ICD-10-CM | POA: Diagnosis not present

## 2013-09-19 DIAGNOSIS — I5043 Acute on chronic combined systolic (congestive) and diastolic (congestive) heart failure: Secondary | ICD-10-CM | POA: Diagnosis present

## 2013-09-19 DIAGNOSIS — R0602 Shortness of breath: Secondary | ICD-10-CM | POA: Diagnosis present

## 2013-09-19 DIAGNOSIS — J988 Other specified respiratory disorders: Secondary | ICD-10-CM | POA: Diagnosis not present

## 2013-09-19 DIAGNOSIS — J9819 Other pulmonary collapse: Secondary | ICD-10-CM | POA: Diagnosis not present

## 2013-09-19 DIAGNOSIS — Z01812 Encounter for preprocedural laboratory examination: Secondary | ICD-10-CM

## 2013-09-19 DIAGNOSIS — I272 Pulmonary hypertension, unspecified: Secondary | ICD-10-CM | POA: Diagnosis present

## 2013-09-19 DIAGNOSIS — Y9210 Unspecified residential institution as the place of occurrence of the external cause: Secondary | ICD-10-CM | POA: Diagnosis not present

## 2013-09-19 DIAGNOSIS — Z7901 Long term (current) use of anticoagulants: Secondary | ICD-10-CM

## 2013-09-19 DIAGNOSIS — I5042 Chronic combined systolic (congestive) and diastolic (congestive) heart failure: Secondary | ICD-10-CM | POA: Diagnosis present

## 2013-09-19 DIAGNOSIS — Z6841 Body Mass Index (BMI) 40.0 and over, adult: Secondary | ICD-10-CM

## 2013-09-19 HISTORY — DX: Other specified postprocedural states: Z98.890

## 2013-09-19 HISTORY — DX: Other cardiomyopathies: I42.8

## 2013-09-19 HISTORY — PX: INTRAOPERATIVE TRANSESOPHAGEAL ECHOCARDIOGRAM: SHX5062

## 2013-09-19 HISTORY — PX: MITRAL VALVE REPAIR: SHX2039

## 2013-09-19 LAB — POCT I-STAT 3, ART BLOOD GAS (G3+)
ACID-BASE DEFICIT: 5 mmol/L — AB (ref 0.0–2.0)
Acid-base deficit: 2 mmol/L (ref 0.0–2.0)
Acid-base deficit: 5 mmol/L — ABNORMAL HIGH (ref 0.0–2.0)
Acid-base deficit: 3 mmol/L — ABNORMAL HIGH (ref 0.0–2.0)
Acid-base deficit: 5 mmol/L — ABNORMAL HIGH (ref 0.0–2.0)
Bicarbonate: 23 mEq/L (ref 20.0–24.0)
Bicarbonate: 22.4 mEq/L (ref 20.0–24.0)
Bicarbonate: 21.5 mEq/L (ref 20.0–24.0)
Bicarbonate: 22.1 mEq/L (ref 20.0–24.0)
Bicarbonate: 20.6 mEq/L (ref 20.0–24.0)
O2 SAT: 100 %
O2 SAT: 99 %
O2 SAT: 97 %
O2 Saturation: 100 %
O2 Saturation: 96 %
PCO2 ART: 51.2 mmHg — AB (ref 35.0–45.0)
PCO2 ART: 39 mmHg (ref 35.0–45.0)
PH ART: 7.25 — AB (ref 7.350–7.450)
PH ART: 7.366 (ref 7.350–7.450)
PH ART: 7.336 — AB (ref 7.350–7.450)
PO2 ART: 83 mmHg (ref 80.0–100.0)
PO2 ART: 126 mmHg — AB (ref 80.0–100.0)
PO2 ART: 101 mmHg — AB (ref 80.0–100.0)
Patient temperature: 35.1
Patient temperature: 37.7
Patient temperature: 38
TCO2: 24 mmol/L (ref 0–100)
TCO2: 24 mmol/L (ref 0–100)
TCO2: 23 mmol/L (ref 0–100)
TCO2: 23 mmol/L (ref 0–100)
TCO2: 22 mmol/L (ref 0–100)
pCO2 arterial: 39.9 mmHg (ref 35.0–45.0)
pCO2 arterial: 42.4 mmHg (ref 35.0–45.0)
pCO2 arterial: 38.9 mmHg (ref 35.0–45.0)
pH, Arterial: 7.367 (ref 7.350–7.450)
pH, Arterial: 7.303 — ABNORMAL LOW (ref 7.350–7.450)
pO2, Arterial: 306 mmHg — ABNORMAL HIGH (ref 80.0–100.0)
pO2, Arterial: 259 mmHg — ABNORMAL HIGH (ref 80.0–100.0)

## 2013-09-19 LAB — POCT I-STAT 4, (NA,K, GLUC, HGB,HCT)
Glucose, Bld: 121 mg/dL — ABNORMAL HIGH (ref 70–99)
Glucose, Bld: 121 mg/dL — ABNORMAL HIGH (ref 70–99)
Glucose, Bld: 117 mg/dL — ABNORMAL HIGH (ref 70–99)
Glucose, Bld: 181 mg/dL — ABNORMAL HIGH (ref 70–99)
Glucose, Bld: 203 mg/dL — ABNORMAL HIGH (ref 70–99)
Glucose, Bld: 109 mg/dL — ABNORMAL HIGH (ref 70–99)
HCT: 28 % — ABNORMAL LOW (ref 36.0–46.0)
HCT: 24 % — ABNORMAL LOW (ref 36.0–46.0)
HEMATOCRIT: 30 % — AB (ref 36.0–46.0)
HEMATOCRIT: 22 % — AB (ref 36.0–46.0)
HEMATOCRIT: 22 % — AB (ref 36.0–46.0)
HEMATOCRIT: 33 % — AB (ref 36.0–46.0)
HEMOGLOBIN: 7.5 g/dL — AB (ref 12.0–15.0)
HEMOGLOBIN: 11.2 g/dL — AB (ref 12.0–15.0)
Hemoglobin: 10.2 g/dL — ABNORMAL LOW (ref 12.0–15.0)
Hemoglobin: 9.5 g/dL — ABNORMAL LOW (ref 12.0–15.0)
Hemoglobin: 7.5 g/dL — ABNORMAL LOW (ref 12.0–15.0)
Hemoglobin: 8.2 g/dL — ABNORMAL LOW (ref 12.0–15.0)
POTASSIUM: 3.8 meq/L (ref 3.7–5.3)
POTASSIUM: 3.8 meq/L (ref 3.7–5.3)
Potassium: 4 mEq/L (ref 3.7–5.3)
Potassium: 4.5 mEq/L (ref 3.7–5.3)
Potassium: 5 mEq/L (ref 3.7–5.3)
Potassium: 4.1 mEq/L (ref 3.7–5.3)
SODIUM: 137 meq/L (ref 137–147)
SODIUM: 136 meq/L — AB (ref 137–147)
Sodium: 141 mEq/L (ref 137–147)
Sodium: 140 mEq/L (ref 137–147)
Sodium: 139 mEq/L (ref 137–147)
Sodium: 142 mEq/L (ref 137–147)

## 2013-09-19 LAB — MAGNESIUM: MAGNESIUM: 3.3 mg/dL — AB (ref 1.5–2.5)

## 2013-09-19 LAB — PULMONARY FUNCTION TEST

## 2013-09-19 LAB — HEMOGLOBIN AND HEMATOCRIT, BLOOD
HCT: 21.5 % — ABNORMAL LOW (ref 36.0–46.0)
Hemoglobin: 6.8 g/dL — CL (ref 12.0–15.0)

## 2013-09-19 LAB — POCT I-STAT, CHEM 8
BUN: 11 mg/dL (ref 6–23)
CHLORIDE: 107 meq/L (ref 96–112)
Calcium, Ion: 1.21 mmol/L (ref 1.12–1.23)
Creatinine, Ser: 0.9 mg/dL (ref 0.50–1.10)
Glucose, Bld: 103 mg/dL — ABNORMAL HIGH (ref 70–99)
HEMATOCRIT: 29 % — AB (ref 36.0–46.0)
HEMOGLOBIN: 9.9 g/dL — AB (ref 12.0–15.0)
POTASSIUM: 4.2 meq/L (ref 3.7–5.3)
SODIUM: 141 meq/L (ref 137–147)
TCO2: 21 mmol/L (ref 0–100)

## 2013-09-19 LAB — PROTIME-INR
INR: 1.51 — AB (ref 0.00–1.49)
Prothrombin Time: 17.8 seconds — ABNORMAL HIGH (ref 11.6–15.2)

## 2013-09-19 LAB — CREATININE, SERUM
CREATININE: 1.05 mg/dL (ref 0.50–1.10)
GFR calc non Af Amer: 73 mL/min — ABNORMAL LOW (ref >90)
GFR, EST AFRICAN AMERICAN: 84 mL/min — AB (ref >90)

## 2013-09-19 LAB — BLOOD GAS, ARTERIAL
ACID-BASE DEFICIT: 1 mmol/L (ref 0.0–2.0)
Bicarbonate: 23.1 mEq/L (ref 20.0–24.0)
DRAWN BY: 181601
FIO2: 0.21 %
O2 Saturation: 72.9 %
PATIENT TEMPERATURE: 98.6
TCO2: 24.2 mmol/L (ref 0–100)
pCO2 arterial: 37.4 mmHg (ref 35.0–45.0)
pH, Arterial: 7.407 (ref 7.350–7.450)
pO2, Arterial: 42.6 mmHg — ABNORMAL LOW (ref 80.0–100.0)

## 2013-09-19 LAB — CBC
HCT: 27.2 % — ABNORMAL LOW (ref 36.0–46.0)
HEMATOCRIT: 30.8 % — AB (ref 36.0–46.0)
Hemoglobin: 9.5 g/dL — ABNORMAL LOW (ref 12.0–15.0)
Hemoglobin: 8.4 g/dL — ABNORMAL LOW (ref 12.0–15.0)
MCH: 22.9 pg — ABNORMAL LOW (ref 26.0–34.0)
MCH: 22.8 pg — ABNORMAL LOW (ref 26.0–34.0)
MCHC: 30.8 g/dL (ref 30.0–36.0)
MCHC: 30.9 g/dL (ref 30.0–36.0)
MCV: 74.4 fL — ABNORMAL LOW (ref 78.0–100.0)
MCV: 73.9 fL — ABNORMAL LOW (ref 78.0–100.0)
PLATELETS: 204 10*3/uL (ref 150–400)
Platelets: 177 10*3/uL (ref 150–400)
RBC: 4.14 MIL/uL (ref 3.87–5.11)
RBC: 3.68 MIL/uL — AB (ref 3.87–5.11)
RDW: 19.3 % — AB (ref 11.5–15.5)
RDW: 19.2 % — ABNORMAL HIGH (ref 11.5–15.5)
WBC: 21.2 10*3/uL — ABNORMAL HIGH (ref 4.0–10.5)
WBC: 13.4 10*3/uL — ABNORMAL HIGH (ref 4.0–10.5)

## 2013-09-19 LAB — GLUCOSE, CAPILLARY
GLUCOSE-CAPILLARY: 116 mg/dL — AB (ref 70–99)
Glucose-Capillary: 100 mg/dL — ABNORMAL HIGH (ref 70–99)

## 2013-09-19 LAB — POCT I-STAT GLUCOSE
GLUCOSE: 167 mg/dL — AB (ref 70–99)
Operator id: 3406

## 2013-09-19 LAB — APTT: aPTT: 31 seconds (ref 24–37)

## 2013-09-19 LAB — PLATELET COUNT: Platelets: 225 10*3/uL (ref 150–400)

## 2013-09-19 SURGERY — REPAIR, MITRAL VALVE, MINIMALLY INVASIVE
Anesthesia: General | Site: Chest | Laterality: Right

## 2013-09-19 MED ORDER — DEXMEDETOMIDINE HCL IN NACL 200 MCG/50ML IV SOLN
INTRAVENOUS | Status: AC
  Filled 2013-09-19: qty 50

## 2013-09-19 MED ORDER — ALBUMIN HUMAN 5 % IV SOLN
INTRAVENOUS | Status: DC | PRN
  Administered 2013-09-19: 13:00:00 via INTRAVENOUS

## 2013-09-19 MED ORDER — SODIUM CHLORIDE 0.9 % IV SOLN
250.0000 mL | INTRAVENOUS | Status: AC
  Administered 2013-09-19: 1000 mL via INTRAVENOUS

## 2013-09-19 MED ORDER — ACETAMINOPHEN 650 MG RE SUPP
650.0000 mg | Freq: Once | RECTAL | Status: AC
  Administered 2013-09-19: 650 mg via RECTAL

## 2013-09-19 MED ORDER — MORPHINE SULFATE 2 MG/ML IJ SOLN
2.0000 mg | INTRAMUSCULAR | Status: DC | PRN
  Administered 2013-09-19: 4 mg via INTRAVENOUS
  Administered 2013-09-19: 2 mg via INTRAVENOUS
  Administered 2013-09-20: 4 mg via INTRAVENOUS
  Filled 2013-09-19 (×2): qty 2
  Filled 2013-09-19: qty 1

## 2013-09-19 MED ORDER — MIDAZOLAM HCL 5 MG/5ML IJ SOLN
INTRAMUSCULAR | Status: DC | PRN
  Administered 2013-09-19 (×3): 2 mg via INTRAVENOUS
  Administered 2013-09-19: 4 mg via INTRAVENOUS
  Administered 2013-09-19 (×2): 2 mg via INTRAVENOUS

## 2013-09-19 MED ORDER — BISACODYL 5 MG PO TBEC
10.0000 mg | DELAYED_RELEASE_TABLET | Freq: Every day | ORAL | Status: DC
  Administered 2013-09-20 – 2013-09-23 (×4): 10 mg via ORAL
  Filled 2013-09-19 (×4): qty 2

## 2013-09-19 MED ORDER — INSULIN ASPART 100 UNIT/ML ~~LOC~~ SOLN
0.0000 [IU] | SUBCUTANEOUS | Status: DC
  Administered 2013-09-20 (×2): 2 [IU] via SUBCUTANEOUS

## 2013-09-19 MED ORDER — METOPROLOL TARTRATE 12.5 MG HALF TABLET
12.5000 mg | ORAL_TABLET | Freq: Two times a day (BID) | ORAL | Status: DC
  Administered 2013-09-20 – 2013-09-21 (×2): 12.5 mg via ORAL
  Filled 2013-09-19 (×5): qty 1

## 2013-09-19 MED ORDER — NITROGLYCERIN IN D5W 200-5 MCG/ML-% IV SOLN: 0.0000 ug/min | INTRAVENOUS | Status: DC

## 2013-09-19 MED ORDER — LACTATED RINGERS IV SOLN: 500.0000 mL | Freq: Once | INTRAVENOUS | Status: AC | PRN

## 2013-09-19 MED ORDER — PANTOPRAZOLE SODIUM 40 MG PO TBEC
40.0000 mg | DELAYED_RELEASE_TABLET | Freq: Every day | ORAL | Status: DC
  Administered 2013-09-21 – 2013-09-25 (×5): 40 mg via ORAL
  Filled 2013-09-19 (×5): qty 1

## 2013-09-19 MED ORDER — INSULIN REGULAR BOLUS VIA INFUSION
0.0000 [IU] | Freq: Three times a day (TID) | INTRAVENOUS | Status: DC
  Filled 2013-09-19: qty 10

## 2013-09-19 MED ORDER — OXYCODONE HCL 5 MG PO TABS
5.0000 mg | ORAL_TABLET | ORAL | Status: DC | PRN
  Administered 2013-09-20 – 2013-09-22 (×11): 10 mg via ORAL
  Administered 2013-09-23: 5 mg via ORAL
  Administered 2013-09-24: 10 mg via ORAL
  Administered 2013-09-25: 5 mg via ORAL
  Filled 2013-09-19 (×3): qty 2
  Filled 2013-09-19: qty 1
  Filled 2013-09-19 (×3): qty 2
  Filled 2013-09-19: qty 1
  Filled 2013-09-19 (×6): qty 2

## 2013-09-19 MED ORDER — PROPOFOL 10 MG/ML IV BOLUS
INTRAVENOUS | Status: DC | PRN
  Administered 2013-09-19: 140 mg via INTRAVENOUS

## 2013-09-19 MED ORDER — 0.9 % SODIUM CHLORIDE (POUR BTL) OPTIME
TOPICAL | Status: DC | PRN
  Administered 2013-09-19: 6000 mL

## 2013-09-19 MED ORDER — ARTIFICIAL TEARS OP OINT
TOPICAL_OINTMENT | OPHTHALMIC | Status: DC | PRN
  Administered 2013-09-19: 1 via OPHTHALMIC

## 2013-09-19 MED ORDER — FENTANYL CITRATE 0.05 MG/ML IJ SOLN
INTRAMUSCULAR | Status: AC
  Filled 2013-09-19: qty 5

## 2013-09-19 MED ORDER — SODIUM CHLORIDE 0.45 % IV SOLN
INTRAVENOUS | Status: DC
  Administered 2013-09-19: 20 mL/h via INTRAVENOUS

## 2013-09-19 MED ORDER — MAGNESIUM SULFATE 40 MG/ML IJ SOLN
4.0000 g | Freq: Once | INTRAMUSCULAR | Status: AC
  Administered 2013-09-19: 4 g via INTRAVENOUS
  Filled 2013-09-19: qty 100

## 2013-09-19 MED ORDER — ROCURONIUM BROMIDE 50 MG/5ML IV SOLN
INTRAVENOUS | Status: AC
  Filled 2013-09-19: qty 1

## 2013-09-19 MED ORDER — MIDAZOLAM HCL 2 MG/2ML IJ SOLN: 2.0000 mg | INTRAMUSCULAR | Status: DC | PRN

## 2013-09-19 MED ORDER — LEVOTHYROXINE SODIUM 75 MCG PO TABS
75.0000 ug | ORAL_TABLET | Freq: Every day | ORAL | Status: DC
  Administered 2013-09-20 – 2013-09-25 (×6): 75 ug via ORAL
  Filled 2013-09-19 (×8): qty 1

## 2013-09-19 MED ORDER — HEPARIN SODIUM (PORCINE) 1000 UNIT/ML IJ SOLN
INTRAMUSCULAR | Status: AC
  Filled 2013-09-19: qty 1

## 2013-09-19 MED ORDER — FAMOTIDINE IN NACL 20-0.9 MG/50ML-% IV SOLN
20.0000 mg | Freq: Two times a day (BID) | INTRAVENOUS | Status: AC
  Administered 2013-09-19: 20 mg via INTRAVENOUS

## 2013-09-19 MED ORDER — ALBUMIN HUMAN 5 % IV SOLN
250.0000 mL | INTRAVENOUS | Status: AC | PRN
  Administered 2013-09-19 (×2): 250 mL via INTRAVENOUS

## 2013-09-19 MED ORDER — DEXMEDETOMIDINE HCL IN NACL 200 MCG/50ML IV SOLN
0.1000 ug/kg/h | INTRAVENOUS | Status: DC
  Filled 2013-09-19: qty 50

## 2013-09-19 MED ORDER — PROTAMINE SULFATE 10 MG/ML IV SOLN
INTRAVENOUS | Status: AC
  Filled 2013-09-19: qty 25

## 2013-09-19 MED ORDER — SODIUM CHLORIDE 0.9 % IJ SOLN: 3.0000 mL | INTRAMUSCULAR | Status: DC | PRN

## 2013-09-19 MED ORDER — SODIUM CHLORIDE 0.9 % IJ SOLN
3.0000 mL | Freq: Two times a day (BID) | INTRAMUSCULAR | Status: DC
  Administered 2013-09-20 – 2013-09-21 (×3): 3 mL via INTRAVENOUS

## 2013-09-19 MED ORDER — VECURONIUM BROMIDE 10 MG IV SOLR
INTRAVENOUS | Status: AC
Start: 2013-09-19 — End: 2013-09-19
  Filled 2013-09-19: qty 10

## 2013-09-19 MED ORDER — PHENYLEPHRINE HCL 10 MG/ML IJ SOLN: 0.0000 ug/min | INTRAVENOUS | Status: DC

## 2013-09-19 MED ORDER — ACETAMINOPHEN 500 MG PO TABS
1000.0000 mg | ORAL_TABLET | Freq: Four times a day (QID) | ORAL | Status: AC
  Administered 2013-09-20 – 2013-09-24 (×13): 1000 mg via ORAL
  Filled 2013-09-19 (×21): qty 2

## 2013-09-19 MED ORDER — METOPROLOL TARTRATE 25 MG/10 ML ORAL SUSPENSION
12.5000 mg | Freq: Two times a day (BID) | ORAL | Status: DC
  Filled 2013-09-19 (×3): qty 5

## 2013-09-19 MED ORDER — HEPARIN SODIUM (PORCINE) 1000 UNIT/ML IJ SOLN
INTRAMUSCULAR | Status: DC | PRN
  Administered 2013-09-19: 33000 [IU] via INTRAVENOUS

## 2013-09-19 MED ORDER — DOCUSATE SODIUM 100 MG PO CAPS
200.0000 mg | ORAL_CAPSULE | Freq: Every day | ORAL | Status: DC
  Administered 2013-09-20 – 2013-09-22 (×3): 200 mg via ORAL
  Filled 2013-09-19 (×4): qty 2

## 2013-09-19 MED ORDER — BISACODYL 10 MG RE SUPP: 10.0000 mg | Freq: Every day | RECTAL | Status: DC

## 2013-09-19 MED ORDER — MILRINONE IN DEXTROSE 20 MG/100ML IV SOLN
0.0000 ug/kg/min | INTRAVENOUS | Status: DC
  Administered 2013-09-19: 0.3 ug/kg/min via INTRAVENOUS
  Filled 2013-09-19 (×2): qty 100

## 2013-09-19 MED ORDER — VECURONIUM BROMIDE 10 MG IV SOLR
INTRAVENOUS | Status: AC
  Filled 2013-09-19: qty 10

## 2013-09-19 MED ORDER — FENTANYL CITRATE 0.05 MG/ML IJ SOLN
INTRAMUSCULAR | Status: AC
Start: 2013-09-19 — End: 2013-09-19
  Filled 2013-09-19: qty 2

## 2013-09-19 MED ORDER — LACTATED RINGERS IV SOLN: INTRAVENOUS | Status: DC

## 2013-09-19 MED ORDER — ONDANSETRON HCL 4 MG/2ML IJ SOLN: 4.0000 mg | Freq: Four times a day (QID) | INTRAMUSCULAR | Status: DC | PRN

## 2013-09-19 MED ORDER — DEXTROSE 5 % IV SOLN
1.5000 g | Freq: Two times a day (BID) | INTRAVENOUS | Status: AC
  Administered 2013-09-19 – 2013-09-21 (×4): 1.5 g via INTRAVENOUS
  Filled 2013-09-19 (×4): qty 1.5

## 2013-09-19 MED ORDER — MIDAZOLAM HCL 2 MG/2ML IJ SOLN
INTRAMUSCULAR | Status: AC
  Filled 2013-09-19: qty 2

## 2013-09-19 MED ORDER — SODIUM CHLORIDE 0.9 % IV SOLN
INTRAVENOUS | Status: DC
  Administered 2013-09-19: 1.4 [IU]/h via INTRAVENOUS
  Filled 2013-09-19 (×2): qty 1

## 2013-09-19 MED ORDER — ROCURONIUM BROMIDE 100 MG/10ML IV SOLN
INTRAVENOUS | Status: DC | PRN
  Administered 2013-09-19: 80 mg via INTRAVENOUS
  Administered 2013-09-19: 20 mg via INTRAVENOUS

## 2013-09-19 MED ORDER — LACTATED RINGERS IV SOLN
INTRAVENOUS | Status: DC | PRN
  Administered 2013-09-19: 07:00:00 via INTRAVENOUS

## 2013-09-19 MED ORDER — FENTANYL CITRATE 0.05 MG/ML IJ SOLN
INTRAMUSCULAR | Status: DC | PRN
  Administered 2013-09-19: 200 ug via INTRAVENOUS
  Administered 2013-09-19: 250 ug via INTRAVENOUS
  Administered 2013-09-19: 50 ug via INTRAVENOUS
  Administered 2013-09-19 (×3): 100 ug via INTRAVENOUS
  Administered 2013-09-19: 50 ug via INTRAVENOUS
  Administered 2013-09-19: 250 ug via INTRAVENOUS

## 2013-09-19 MED ORDER — SODIUM CHLORIDE 0.9 % IJ SOLN
INTRAMUSCULAR | Status: AC
  Filled 2013-09-19: qty 10

## 2013-09-19 MED ORDER — LACTATED RINGERS IV SOLN
INTRAVENOUS | Status: DC | PRN
  Administered 2013-09-19 (×2): via INTRAVENOUS

## 2013-09-19 MED ORDER — ASPIRIN EC 325 MG PO TBEC
325.0000 mg | DELAYED_RELEASE_TABLET | Freq: Every day | ORAL | Status: DC
  Filled 2013-09-19: qty 1

## 2013-09-19 MED ORDER — METOPROLOL TARTRATE 12.5 MG HALF TABLET
ORAL_TABLET | ORAL | Status: AC
  Filled 2013-09-19: qty 1

## 2013-09-19 MED ORDER — MIDAZOLAM HCL 10 MG/2ML IJ SOLN
INTRAMUSCULAR | Status: AC
  Filled 2013-09-19: qty 2

## 2013-09-19 MED ORDER — PROTAMINE SULFATE 10 MG/ML IV SOLN
INTRAVENOUS | Status: AC
  Filled 2013-09-19: qty 5

## 2013-09-19 MED ORDER — VECURONIUM BROMIDE 10 MG IV SOLR
INTRAVENOUS | Status: DC | PRN
  Administered 2013-09-19: 10 mg via INTRAVENOUS
  Administered 2013-09-19: 2 mg via INTRAVENOUS
  Administered 2013-09-19: 8 mg via INTRAVENOUS

## 2013-09-19 MED ORDER — SODIUM CHLORIDE 0.9 % IV SOLN
INTRAVENOUS | Status: DC
  Administered 2013-09-19: 20 mL/h via INTRAVENOUS

## 2013-09-19 MED ORDER — PROPOFOL 10 MG/ML IV BOLUS
INTRAVENOUS | Status: AC
  Filled 2013-09-19: qty 20

## 2013-09-19 MED ORDER — LIDOCAINE HCL (CARDIAC) 20 MG/ML IV SOLN
INTRAVENOUS | Status: DC | PRN
  Administered 2013-09-19: 100 mg via INTRAVENOUS

## 2013-09-19 MED ORDER — SODIUM CHLORIDE 0.9 % IR SOLN
Status: DC | PRN
  Administered 2013-09-19: 3000 mL

## 2013-09-19 MED ORDER — PHENYLEPHRINE HCL 10 MG/ML IJ SOLN
10.0000 mg | INTRAVENOUS | Status: DC | PRN
  Administered 2013-09-19: 10 ug/min via INTRAVENOUS

## 2013-09-19 MED ORDER — ACETAMINOPHEN 160 MG/5ML PO SOLN
1000.0000 mg | Freq: Four times a day (QID) | ORAL | Status: DC
  Administered 2013-09-20: 1000 mg
  Filled 2013-09-19: qty 40.6

## 2013-09-19 MED ORDER — GLUTARALDEHYDE 0.625% SOAKING SOLUTION
TOPICAL | Status: DC | PRN
  Filled 2013-09-19: qty 50

## 2013-09-19 MED ORDER — POTASSIUM CHLORIDE 10 MEQ/50ML IV SOLN: 10.0000 meq | INTRAVENOUS | Status: AC

## 2013-09-19 MED ORDER — VANCOMYCIN HCL IN DEXTROSE 1-5 GM/200ML-% IV SOLN
1000.0000 mg | Freq: Once | INTRAVENOUS | Status: AC
  Administered 2013-09-19: 1000 mg via INTRAVENOUS
  Filled 2013-09-19: qty 200

## 2013-09-19 MED ORDER — ASPIRIN 81 MG PO CHEW: 324.0000 mg | CHEWABLE_TABLET | Freq: Every day | ORAL | Status: DC

## 2013-09-19 MED ORDER — ACETAMINOPHEN 160 MG/5ML PO SOLN: 650.0000 mg | Freq: Once | ORAL | Status: AC

## 2013-09-19 MED ORDER — METOPROLOL TARTRATE 1 MG/ML IV SOLN: 2.5000 mg | INTRAVENOUS | Status: DC | PRN

## 2013-09-19 MED ORDER — PROTAMINE SULFATE 10 MG/ML IV SOLN
INTRAVENOUS | Status: DC | PRN
  Administered 2013-09-19: 300 mg via INTRAVENOUS

## 2013-09-19 MED ORDER — MORPHINE SULFATE 2 MG/ML IJ SOLN: 1.0000 mg | INTRAMUSCULAR | Status: AC | PRN

## 2013-09-19 MED ORDER — MILRINONE IN DEXTROSE 20 MG/100ML IV SOLN
0.1250 ug/kg/min | INTRAVENOUS | Status: AC
  Administered 2013-09-19: .3 ug/kg/min via INTRAVENOUS
  Filled 2013-09-19: qty 100

## 2013-09-19 SURGICAL SUPPLY — 100 items
ADAPTER CARDIO PERF ANTE/RETRO (ADAPTER) ×3 IMPLANT
BAG DECANTER FOR FLEXI CONT (MISCELLANEOUS) ×3 IMPLANT
BENZOIN TINCTURE PRP APPL 2/3 (GAUZE/BANDAGES/DRESSINGS) ×3 IMPLANT
BLADE SURG 11 STRL SS (BLADE) ×3 IMPLANT
CANISTER SUCTION 2500CC (MISCELLANEOUS) ×6 IMPLANT
CANNULA FEM VENOUS REMOTE 22FR (CANNULA) ×3 IMPLANT
CANNULA FEMORAL ART 14 SM (MISCELLANEOUS) ×3 IMPLANT
CANNULA GUNDRY RCSP 15FR (MISCELLANEOUS) ×3 IMPLANT
CANNULA OPTISITE PERFUSION 16F (CANNULA) IMPLANT
CANNULA OPTISITE PERFUSION 18F (CANNULA) ×3 IMPLANT
CARDIAC SUCTION (MISCELLANEOUS) ×3 IMPLANT
CATH KIT ON Q 5IN SLV (PAIN MANAGEMENT) IMPLANT
CONN ST 1/4X3/8  BEN (MISCELLANEOUS) ×2
CONN ST 1/4X3/8 BEN (MISCELLANEOUS) ×4 IMPLANT
CONT SPEC STER OR (MISCELLANEOUS) ×3 IMPLANT
COVER BACK TABLE 24X17X13 BIG (DRAPES) ×3 IMPLANT
COVER MAYO STAND STRL (DRAPES) ×3 IMPLANT
COVER SURGICAL LIGHT HANDLE (MISCELLANEOUS) ×6 IMPLANT
CRADLE DONUT ADULT HEAD (MISCELLANEOUS) ×3 IMPLANT
DERMABOND ADVANCED (GAUZE/BANDAGES/DRESSINGS) ×2
DERMABOND ADVANCED .7 DNX12 (GAUZE/BANDAGES/DRESSINGS) ×4 IMPLANT
DEVICE PMI PUNCTURE CLOSURE (MISCELLANEOUS) ×3 IMPLANT
DEVICE SUT CK QUICK LOAD INDV (Prosthesis & Implant Heart) ×6 IMPLANT
DEVICE SUT CK QUICK LOAD MINI (Prosthesis & Implant Heart) ×9 IMPLANT
DEVICE TROCAR PUNCTURE CLOSURE (ENDOMECHANICALS) ×3 IMPLANT
DRAIN CHANNEL 28F RND 3/8 FF (WOUND CARE) ×6 IMPLANT
DRAPE BILATERAL SPLIT (DRAPES) ×3 IMPLANT
DRAPE C-ARM 42X72 X-RAY (DRAPES) ×3 IMPLANT
DRAPE CV SPLIT W-CLR ANES SCRN (DRAPES) ×3 IMPLANT
DRAPE INCISE IOBAN 66X45 STRL (DRAPES) ×9 IMPLANT
DRAPE PROXIMA HALF (DRAPES) ×3 IMPLANT
DRAPE SLUSH/WARMER DISC (DRAPES) IMPLANT
DRSG COVADERM 4X10 (GAUZE/BANDAGES/DRESSINGS) ×3 IMPLANT
DRSG COVADERM 4X8 (GAUZE/BANDAGES/DRESSINGS) ×3 IMPLANT
ELECT BLADE 6.5 EXT (BLADE) ×3 IMPLANT
ELECT REM PT RETURN 9FT ADLT (ELECTROSURGICAL) ×6
ELECTRODE REM PT RTRN 9FT ADLT (ELECTROSURGICAL) ×4 IMPLANT
FEMORAL VENOUS CANN RAP (CANNULA) IMPLANT
GLOVE BIO SURGEON STRL SZ 6 (GLOVE) ×15 IMPLANT
GLOVE BIO SURGEON STRL SZ 6.5 (GLOVE) ×15 IMPLANT
GLOVE BIOGEL PI IND STRL 6 (GLOVE) ×4 IMPLANT
GLOVE BIOGEL PI INDICATOR 6 (GLOVE) ×2
GLOVE ORTHO TXT STRL SZ7.5 (GLOVE) ×15 IMPLANT
GOWN STRL NON-REIN LRG LVL3 (GOWN DISPOSABLE) ×12 IMPLANT
GUIDEWIRE ANG ZIPWIRE 038X150 (WIRE) IMPLANT
INSERT CONFORM CROSS CLAMP 66M (MISCELLANEOUS) IMPLANT
INSERT CONFORM CROSS CLAMP 86M (MISCELLANEOUS) IMPLANT
KIT BASIN OR (CUSTOM PROCEDURE TRAY) ×3 IMPLANT
KIT DEVICE SUT COR-KNOT MIS 5 (INSTRUMENTS) ×3 IMPLANT
KIT DILATOR VASC 18G NDL (KITS) ×3 IMPLANT
KIT DRAINAGE VACCUM ASSIST (KITS) ×3 IMPLANT
KIT ROOM TURNOVER OR (KITS) ×3 IMPLANT
KIT SUCTION CATH 14FR (SUCTIONS) ×3 IMPLANT
LEAD PACING MYOCARDI (MISCELLANEOUS) ×3 IMPLANT
LINE VENT (MISCELLANEOUS) ×3 IMPLANT
NDL SUT 4 .5 CRC FRENCH EYE (NEEDLE) ×2 IMPLANT
NEEDLE AORTIC ROOT 14G 7F (CATHETERS) ×3 IMPLANT
NEEDLE FRENCH EYE (NEEDLE) ×1
NS IRRIG 1000ML POUR BTL (IV SOLUTION) ×18 IMPLANT
PACK OPEN HEART (CUSTOM PROCEDURE TRAY) ×3 IMPLANT
PAD ARMBOARD 7.5X6 YLW CONV (MISCELLANEOUS) ×6 IMPLANT
PAD ELECT DEFIB RADIOL ZOLL (MISCELLANEOUS) ×3 IMPLANT
PATCH CORMATRIX 4CMX7CM (Prosthesis & Implant Heart) ×3 IMPLANT
RETRACTOR TRL SOFT TISSUE LG (INSTRUMENTS) ×3 IMPLANT
RETRACTOR TRM SOFT TISSUE 7.5 (INSTRUMENTS) IMPLANT
RING MITRAL MEMO 3D 26MM SMD26 (Prosthesis & Implant Heart) ×3 IMPLANT
SET CANNULATION TOURNIQUET (MISCELLANEOUS) ×3 IMPLANT
SET CARDIOPLEGIA MPS 5001102 (MISCELLANEOUS) ×3 IMPLANT
SET IRRIG TUBING LAPAROSCOPIC (IRRIGATION / IRRIGATOR) ×3 IMPLANT
SOLUTION ANTI FOG 6CC (MISCELLANEOUS) ×3 IMPLANT
SPONGE GAUZE 4X4 12PLY (GAUZE/BANDAGES/DRESSINGS) ×3 IMPLANT
SPONGE GAUZE 4X4 12PLY STER LF (GAUZE/BANDAGES/DRESSINGS) ×3 IMPLANT
SUCKER WEIGHTED FLEX (MISCELLANEOUS) ×6 IMPLANT
SUT BONE WAX W31G (SUTURE) ×3 IMPLANT
SUT E-PACK MINIMALLY INVASIVE (SUTURE) ×3 IMPLANT
SUT ETHIBOND (SUTURE) ×6 IMPLANT
SUT ETHIBOND 2-0 RB-1 WHT (SUTURE) ×6 IMPLANT
SUT ETHIBOND X763 2 0 SH 1 (SUTURE) ×3 IMPLANT
SUT GORETEX CV 4 TH 22 36 (SUTURE) ×3 IMPLANT
SUT GORETEX CV4 TH-18 (SUTURE) ×6 IMPLANT
SUT MNCRL AB 3-0 PS2 18 (SUTURE) ×6 IMPLANT
SUT PROLENE 3 0 SH1 36 (SUTURE) ×12 IMPLANT
SUT SILK 2 0 SH CR/8 (SUTURE) ×3 IMPLANT
SUT SILK 3 0 SH CR/8 (SUTURE) ×3 IMPLANT
SUT VIC AB 2-0 CTX 36 (SUTURE) ×6 IMPLANT
SUT VIC AB 3-0 SH 8-18 (SUTURE) ×12 IMPLANT
SUT VICRYL 2 TP 1 (SUTURE) ×3 IMPLANT
SYRINGE 10CC LL (SYRINGE) ×3 IMPLANT
SYSTEM SAHARA CHEST DRAIN ATS (WOUND CARE) ×3 IMPLANT
TAPE CLOTH SURG 4X10 WHT LF (GAUZE/BANDAGES/DRESSINGS) ×3 IMPLANT
TAPE PAPER 2X10 WHT MICROPORE (GAUZE/BANDAGES/DRESSINGS) ×3 IMPLANT
TOWEL OR 17X24 6PK STRL BLUE (TOWEL DISPOSABLE) ×3 IMPLANT
TOWEL OR 17X26 10 PK STRL BLUE (TOWEL DISPOSABLE) ×3 IMPLANT
TRAY FOLEY IC TEMP SENS 14FR (CATHETERS) ×3 IMPLANT
TROCAR XCEL BLADELESS 5X75MML (TROCAR) ×3 IMPLANT
TROCAR XCEL NON-BLD 11X100MML (ENDOMECHANICALS) ×6 IMPLANT
TUNNELER SHEATH ON-Q 11GX8 DSP (PAIN MANAGEMENT) IMPLANT
UNDERPAD 30X30 INCONTINENT (UNDERPADS AND DIAPERS) ×3 IMPLANT
WATER STERILE IRR 1000ML POUR (IV SOLUTION) ×6 IMPLANT
WIRE BENTSON .035X145CM (WIRE) ×3 IMPLANT

## 2013-09-19 NOTE — Interval H&P Note (Signed)
History and Physical Interval Note:  09/19/2013 7:30 AM  Paula Wolfe  has presented today for surgery, with the diagnosis of SEVERE MR  The various methods of treatment have been discussed with the patient and family. After consideration of risks, benefits and other options for treatment, the patient has consented to  Procedure(s): MINIMALLY INVASIVE MITRAL VALVE REPAIR (MVR) OR REPLACEMENT (Right) INTRAOPERATIVE TRANSESOPHAGEAL ECHOCARDIOGRAM (N/A) as a surgical intervention .  The patient's history has been reviewed, patient examined, no change in status, stable for surgery.  I have reviewed the patient's chart and labs.  Questions were answered to the patient's satisfaction.    In the unlikely event that her valve cannot be successfully repaired, we discussed the possibility of replacing the mitral valve using a mechanical prosthesis with the attendant need for long-term anticoagulation versus the alternative of replacing it using a bioprosthetic tissue valve with its potential for late structural valve deterioration and failure, depending upon the patient's longevity.  The patient specifically requests that if the mitral valve must be replaced that it be done using a bioprosthetic tissue valve despite her young age, because she hopes to be able to have children.  She understands that tissue valves are more prone to deterioration in the mitral position, particularly in relatively young patients.  All questions have been answered.    Mylo Driskill H

## 2013-09-19 NOTE — Op Note (Signed)
CARDIOTHORACIC SURGERY OPERATIVE NOTE  Date of Procedure:  09/19/2013  Preoperative Diagnosis: Severe Mitral Regurgitation  Postoperative Diagnosis: Same  Procedure:    Minimally-Invasive Mitral Valve Repair  Sorin Memo 3D Ring Annuloplasty (size 32mm, catalog # [id number], serial # [id number])    Surgeon: 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 The patient and her parents state that she was told she had a heart murmur and leaky heart valve during childhood. There is no history of rheumatic fever. She was never formally evaluated by a cardiologist. The patient states that for several months she has experienced vague tightness across her chest with exertion and worsening orthopnea. She developed somewhat sudden onset of resting shortness of breath prompting presentation to the emergency department 09/12/2013. She tried to relax but her breathing got worse and became associated with cough producing pink, frothy secretions, ultimately causing her to call EMS. In the ED she underwent CT angiogram of the chest which ruled out pulmonary embolus but was notable for the presence of interstitial pulmonary edema. A transthoracic echocardiogram was performed demonstrating severe mitral regurgitation with mild left ventricular systolic  dysfunction. Cardiology was consulted. She subsequently underwent transesophageal echocardiogram which confirmed the presence of severe mitral regurgitation and mild left ventricular dysfunction. Left and right heart catheterization was performed revealing normal coronary artery anatomy with no significant coronary artery disease. There was moderate to severe pulmonary hypertension with large V waves seen on pulmonary capillary wedge tracing. Cardiothoracic surgical consultation was requested.  The patient has been seen in consultation and counseled at length regarding the indications, risks and potential benefits of surgery.  All questions have been answered, and the patient provides full informed consent for the operation as described.     DETAILS OF THE OPERATIVE PROCEDURE  Preparation:  The patient is brought to the operating room on the above mentioned date and central monitoring was established by the anesthesia team including placement of Swan-Ganz catheter through the left internal jugular vein.  A radial arterial line is placed. The patient is placed in the supine position on the operating table.  Intravenous antibiotics are administered. General endotracheal anesthesia is induced uneventfully. The patient is initially intubated using a dual lumen endotracheal tube.  A Foley catheter is placed.  Baseline transesophageal echocardiogram was performed.  Findings were notable for at least moderate left ventricular chamber enlargement with predominantly type I dysfunction of the mitral valve because of annular dilatation. There was severe mitral regurgitation. Left ventricular function was moderately reduced with ejection fraction estimated 45-50%. There were no wall motion abnormalities. Right ventricular size and function appeared normal. There was trivial tricuspid regurgitation and the tricuspid annulus measured less than 3.5 cm in diameter. There was trivial aortic insufficiency. No other  abnormalities were noted.  A soft roll is placed behind the patient's left scapula and the neck gently extended and turned to the left.   The patient's right  neck, chest, abdomen, both groins, and both lower extremities are prepared and draped in a sterile manner. A time out procedure is performed.  Surgical Approach:  A right miniature anterolateral thoracotomy incision is performed. The incision is placed just lateral to and superior to the right nipple. The pectoralis major muscle is divided parallel to the orientation of its fibers. The right pleural space is entered through the 3rd intercostal space. A soft tissue retractor is placed.  Two 11 mm ports are placed through separate stab incisions inferiorly. The right pleural space is insufflated continuously with carbon dioxide gas through the posterior port during the remainder of the operation.  A longitudinal incision is made in the pericardium 3 cm anterior to the phrenic nerve and silk traction sutures are placed on either side of the incision for exposure.   Extracorporeal Cardiopulmonary Bypass and Myocardial Protection:  A small incision is made in the right inguinal crease and the anterior surface of the right common femoral artery and right common femoral vein are identified.  The patient is placed in Trendelenburg position. The right internal jugular vein is cannulated with Seldinger technique and a guidewire advanced into the right atrium. The patient is heparinized systemically. The right internal jugular vein is cannulated with a 14 [place] pediatric femoral venous cannula. Pursestring sutures are placed on the anterior surface of the right common femoral vein and right common femoral artery. The right common femoral vein is cannulated with the Seldinger technique and a guidewire is advanced under transesophageal echocardiogram guidance through the right atrium. The femoral vein is cannulated with a long 22 French femoral venous cannula.  The right common femoral artery is cannulated with Seldinger technique and a flexible guidewire is advanced until it can be appreciated intraluminally in the descending thoracic aorta on transesophageal echocardiogram. The femoral artery is cannulated with an 18 French femoral arterial cannula.  Adequate heparinization is verified.     The entire pre-bypass portion of the operation was notable for stable hemodynamics.  Cardiopulmonary bypass was begun.  Vacuum assist venous drainage is utilized. The incision in the pericardium is extended in both directions. Venous drainage and exposure are notably excellent. A retrograde cardioplegia cannula is placed through the right atrium into the coronary sinus using transesophageal echocardiogram guidance.  An antegrade cardioplegia cannula is placed in the ascending aorta.    The patient is cooled to 28C systemic temperature.  The aortic cross clamp is applied and cold blood cardioplegia is delivered initially in an antegrade fashion through the aortic root.   Supplemental cardioplegia is given retrograde through the coronary sinus catheter. The initial cardioplegic arrest is rapid with early diastolic arrest.  Repeat doses of cardioplegia are administered intermittently every 20 to 30 minutes throughout the entire cross clamp portion of the operation through the aortic root and through the coronary sinus catheter in order to maintain completely flat electrocardiogram.  Myocardial protection was felt to be excellent.   Mitral Valve Repair:  A left atriotomy incision was performed through the interatrial groove and extended partially across the back wall of the left atrium after opening the oblique sinus inferiorly.  The mitral valve is exposed using a self-retaining retractor.  The mitral valve was inspected and notable for congenitally malformed subvalvular apparatus involving the P1 segment of the posterior leaflet and the anterior commissure. In this region  the leaflet of the valve was contiguous with the head of the anterior papillary muscle. There was mild restriction of mobility. The remainder of the  leaflets and subvalvular apparatus appeared normal was completely normal posterior papillary muscle .  Interrupted 2-0 Ethibond horizontal mattress sutures are placed circumferentially around the entire mitral valve annulus. The valve was tested with saline and appeared competent even without ring annuloplasty complete. The valve was sized to a 26 mm annuloplasty ring, based upon the transverse distance between the left and right commissures and the height of the anterior leaflet, corresponding to a size just slightly larger than the overall surface area of the anterior leaflet.  The 26 mm size was not selected to downsize the valve, and a 28 mm sizer would clearly have been too large.  A Sorin Memo 3D annuloplasty ring (size 26mm, catalog [id number], serial [id number]) was secured in place uneventfully. The valve was secured in place using Cor-knot clips.  The valve was tested with saline and appeared competent.   The valve is again tested with saline and appears to be perfectly competent with a broad symmetrical line of coaptation of the anterior and posterior leaflet. There is no residual leak. There was a broad, symmetrical line of coaptation of the anterior and posterior leaflet which was confirmed using the blue ink test.  Rewarming is begun.   Procedure Completion:  The atriotomy was closed using a 2-layer closure of running 3-0 Prolene suture after placing a sump drain across the mitral valve to serve as a left ventricular vent.  One final dose of warm retrograde "hot shot" cardioplegia was administered retrograde through the coronary sinus catheter while all air was evacuated through the aortic root.  The aortic cross clamp was removed after a total cross clamp time of 98 minutes.  Epicardial pacing wires are fixed to the inferior wall of the right  ventricule and to the right atrial appendage. The patient is rewarmed to 37C temperature. The left ventricular vent is removed.  The patient is ventilated and flow volumes turndown while the mitral valve repair is inspected using transesophageal echocardiogram. The valve repair appears intact with no residual leak. The antegrade cardioplegia cannula is now removed. The patient is weaned and disconnected from cardiopulmonary bypass.  The patient's rhythm at separation from bypass was sinus.  The patient was weaned from bypass on low dose milrinone. Total cardiopulmonary bypass time for the operation was 159 minutes.  Followup transesophageal echocardiogram performed after separation from bypass revealed  a well-seated annuloplasty ring in the mitral position with a normal functioning mitral valve. There was no residual leak.  Left ventricular function was unchanged from preoperatively.  The mean gradient across the mitral valve was estimated to be 3-6 mmHg.  The femoral arterial and venous cannulae were removed uneventfully. There was a palpable pulse in the distal right common femoral artery after removal of the cannula. Protamine was administered to reverse the anticoagulation. The right internal jugular cannula was removed and manual pressure held on the neck for 15 minutes.  Single lung ventilation was begun. The atriotomy closure was inspected for hemostasis. The pericardial sac was drained using a 28 French Bard drain placed through the anterior port incision.  The pericardium was closed using a patch of core matrix bovine submucosal tissue patch. The right pleural space is irrigated with saline solution and inspected for hemostasis. The right pleural space was drained using a 28 French Bard drain placed through the posterior port incision. The miniature thoracotomy incision was closed in multiple layers in routine fashion. The right groin incision was inspected for hemostasis and closed in multiple layers  in routine fashion.  The post-bypass portion of the operation was notable for stable rhythm and hemodynamics.  No blood products were administered during the operation.   Disposition:  The patient tolerated the procedure well.  The patient was reintubated using a single lumen endotracheal tube and subsequently transported to the surgical intensive care unit in stable condition. There were no intraoperative complications. All sponge instrument and needle counts are verified correct at completion of the operation.     [name] MD 09/19/2013 2:40 PM

## 2013-09-19 NOTE — Anesthesia Postprocedure Evaluation (Signed)
  Anesthesia Post-op Note  Patient: Paula Wolfe  Procedure(s) Performed: Procedure(s): MINIMALLY INVASIVE MITRAL VALVE REPAIR (MVR) OR REPLACEMENT (Right) INTRAOPERATIVE TRANSESOPHAGEAL ECHOCARDIOGRAM (N/A)  Patient Location: SICU  Anesthesia Type:General  Level of Consciousness: sedated  Airway and Oxygen Therapy: Patient remains intubated per anesthesia plan and Patient placed on Ventilator (see vital sign flow sheet for setting)  Post-op Pain: none  Post-op Assessment: Post-op Vital signs reviewed and Patient's Cardiovascular Status Stable  Post-op Vital Signs: Reviewed and stable  Complications: No apparent anesthesia complications

## 2013-09-19 NOTE — Progress Notes (Signed)
Echocardiogram Echocardiogram Transesophageal has been performed.  Paula Wolfe 09/19/2013, 8:50 AM

## 2013-09-19 NOTE — Anesthesia Procedure Notes (Signed)
Procedure Name: Intubation Date/Time: 09/19/2013 7:51 AM Performed by: Tyrone Nine Pre-anesthesia Checklist: Patient identified, Timeout performed, Emergency Drugs available, Suction available and Patient being monitored Patient Re-evaluated:Patient Re-evaluated prior to inductionOxygen Delivery Method: Circle system utilized Preoxygenation: Pre-oxygenation with 100% oxygen Intubation Type: IV induction Ventilation: Mask ventilation without difficulty and Oral airway inserted - appropriate to patient size Laryngoscope Size: Mac and 3 Grade View: Grade I Endobronchial tube: Left, EBT position confirmed by fiberoptic bronchoscope and EBT position confirmed by auscultation and 37 Fr Number of attempts: 1 Airway Equipment and Method: Stylet Placement Confirmation: ETT inserted through vocal cords under direct vision,  positive ETCO2 and breath sounds checked- equal and bilateral Secured at: 27 cm Tube secured with: Tape

## 2013-09-19 NOTE — Progress Notes (Signed)
[address]       [place] [id number]             [phone]        CARDIOTHORACIC SURGERY PROGRESS NOTE   RDay of Surgery Procedure(s) (LRB): MINIMALLY INVASIVE MITRAL VALVE REPAIR (MVR) OR REPLACEMENT (Right) INTRAOPERATIVE TRANSESOPHAGEAL ECHOCARDIOGRAM (N/A)  Subjective: Intubated  Objective: Vital signs: BP Readings from Last 1 Encounters:  09/19/13 87/53   Pulse Readings from Last 1 Encounters:  09/19/13 75   Resp Readings from Last 1 Encounters:  09/19/13 18   Temp Readings from Last 1 Encounters:  09/19/13 95.2 F (35.1 C)     Hemodynamics: PAP: (19-32)/(6-15) 32/13 mmHg CO:  [3.7 L/min-4.2 L/min] 3.7 L/min CI:  [1.7 L/min/m2-2 L/min/m2] 1.7 L/min/m2  Physical Exam:  Rhythm:   Sinus, occasional PVC  Breath sounds: Crackles bilaterally  Heart sounds:  RRR, occasional PVC. No murmurs, rubs, or gallops  Incisions:  Clean and dry  Abdomen:  Soft and nontender  Extremities:  Bilateral pedal edema, pulse intact bilaterally.    Intake/Output from previous day:   Intake/Output this shift: Total I/O In: 4755.6 [I.V.:3625.6; Blood:550; NG/GT:30; IV Piggyback:550] Out: 4130 [Urine:2025; Blood:2055; Chest Tube:50]  Lab Results:  CBC: Recent Labs  09/17/13 1122  09/19/13 1230 09/19/13 1328 09/19/13 1530  WBC 6.3  --   --   --  21.2*  HGB 10.4*  < > 6.8*  8.2* 7.5* 9.5*  11.2*  HCT 32.7*  < > 21.5*  24.0* 22.0* 30.8*  33.0*  PLT 302  --  225  --  204  < > = values in this interval not displayed.  BMET:  Recent Labs  09/17/13 1122  09/19/13 1328 09/19/13 1530  NA 139  < > 139 142  K 4.0  < > 4.5 4.1  CL 102  --   --   --   CO2 21  --   --   --   GLUCOSE 91  < > 203* 109*  BUN 16  --   --   --   CREATININE 1.01  --   --   --   CALCIUM 9.1  --   --   --   < > = values in this interval not displayed.   CBG (last 3)  No results found for this basename: GLUCAP,  in the last 72 hours  ABG    Component Value  Date/Time   PHART 7.303* 09/19/2013 1530   PCO2ART 42.4 09/19/2013 1530   PO2ART 83.0 09/19/2013 1530   HCO3 21.5 09/19/2013 1530   TCO2 23 09/19/2013 1530   ACIDBASEDEF 5.0* 09/19/2013 1530   O2SAT 96.0 09/19/2013 1530    CXR:   CLINICAL DATA: Status post CABG  EXAM:  PORTABLE CHEST - 1 VIEW  COMPARISON: DG CHEST 2 VIEW dated 09/17/2013  FINDINGS:  The lung volumes are low. Mildly increased interstitial density on  the right is present. There is no pneumothorax or significant  pleural effusion. The cardiopericardial silhouette is mildly  enlarged. The central pulmonary vascularity is prominent. There is a  chest tube in place on the right with the tip projecting between the  posterior medial second and third right rib interspace. The  endotracheal tube tip lies approximately 3.3 cm above the crotch of  the carina. The Swan-Ganz catheter has been placed via the left  internal jugular approach. Its tip lies in the region of the  proximal right main pulmonary  outflow tract. The esophagogastric  tube tip in proximal or project below the level of the GE junction.  IMPRESSION:  1. There is bilateral pulmonary hypoinflation. The right-sided chest  tube as well as the Swan-Ganz catheter PICC appear to be in  reasonable position. Positioning of the endotracheal tube is  adequate. The interstitial markings within the right lung are mildly  increased. There is no pleural effusion or pneumothorax  demonstrated.  2. The right-sided chest tube, the Swan-Ganz catheter, and the  esophagogastric tube appear to be in reasonable position.  Electronically Signed  By: David [place]  On: 09/19/2013 15:53   Assessment/Plan: S/P Procedure(s) (LRB): MINIMALLY INVASIVE MITRAL VALVE REPAIR (MVR) OR REPLACEMENT (Right) INTRAOPERATIVE TRANSESOPHAGEAL ECHOCARDIOGRAM (N/A)  1. CV- s/p minimally invasive mitral valve repair. On milrinone 0.3 mcg/kg/min and Neo 67mcg/min. Aspirin 325 mg, Lopressor 12.5 BID-ordered. BP  95/53.  2. Pulmonary-intubated 3. Acute blood loss anemia-9.5 & 30.8 4. Insulin 1.1 units/hr, last CBG 109    Jari Favre PA-S 09/19/2013 4:45 PM  I have seen and examined the patient and agree with the assessment and plan as outlined.  Overall doing well early postop  OWEN,CLARENCE H

## 2013-09-19 NOTE — Progress Notes (Signed)
Patient ID: Paula Wolfe, female   DOB: 06-Feb-1986, 28 y.o.   MRN: [id number]   SICU Evening Rounds:   Hemodynamically stable  CI = 1.9 on milrinone 0.3, neo 15 mcg  Sinus 74  Asleep on vent  Urine output good  CT output low  CBC    Component Value Date/Time   WBC 21.2* 09/19/2013 1530   RBC 4.14 09/19/2013 1530   HGB 9.5* 09/19/2013 1530   HGB 11.2* 09/19/2013 1530   HCT 30.8* 09/19/2013 1530   HCT 33.0* 09/19/2013 1530   PLT 204 09/19/2013 1530   MCV 74.4* 09/19/2013 1530   MCH 22.9* 09/19/2013 1530   MCHC 30.8 09/19/2013 1530   RDW 19.3* 09/19/2013 1530   LYMPHSABS 2.0 09/12/2013 0013   MONOABS 0.6 09/12/2013 0013   EOSABS 0.1 09/12/2013 0013   BASOSABS 0.0 09/12/2013 0013     BMET    Component Value Date/Time   NA 142 09/19/2013 1530   K 4.1 09/19/2013 1530   CL 102 09/17/2013 1122   CO2 21 09/17/2013 1122   GLUCOSE 109* 09/19/2013 1530   BUN 16 09/17/2013 1122   CREATININE 1.01 09/17/2013 1122   CALCIUM 9.1 09/17/2013 1122   GFRNONAA 76* 09/17/2013 1122   GFRAA 88* 09/17/2013 1122     A/P:  Stable postop course. Wean vent when she warms up.

## 2013-09-19 NOTE — Transfer of Care (Signed)
Immediate Anesthesia Transfer of Care Note  Patient: Paula Wolfe  Procedure(s) Performed: Procedure(s): MINIMALLY INVASIVE MITRAL VALVE REPAIR (MVR) OR REPLACEMENT (Right) INTRAOPERATIVE TRANSESOPHAGEAL ECHOCARDIOGRAM (N/A)  Patient Location: SICU  Anesthesia Type:General  Level of Consciousness: sedated  Airway & Oxygen Therapy: Patient remains intubated per anesthesia plan and Patient placed on Ventilator (see vital sign flow sheet for setting)  Post-op Assessment: Report given to PACU RN and Post -op Vital signs reviewed and stable  Post vital signs: Reviewed and stable  Complications: No apparent anesthesia complications

## 2013-09-19 NOTE — Care Management Note (Addendum)
    Page 1 of 1   09/25/2013     2:31:55 PM   CARE MANAGEMENT NOTE 09/25/2013  Patient:  PALYN, SCRIMA   Account Number:  [id number]  Date Initiated:  09/19/2013  Documentation initiated by:  Elissa Hefty  Subjective/Objective Assessment:   adm w valve repair     Action/Plan:   lives alone, pcp dr a Barbee Shropshire   Anticipated DC Date:  09/25/2013   Anticipated DC Plan:  [place]  CM consult      Choice offered to / List presented to:             Status of service:  Completed, signed off Medicare Important Message given?   (If response is "NO", the following Medicare IM given date fields will be blank) Date Medicare IM given:   Date Additional Medicare IM given:    Discharge Disposition:  HOME/SELF CARE  Per UR Regulation:  Reviewed for med. necessity/level of care/duration of stay  If discussed at [place] of Stay Meetings, dates discussed:    Comments:  09/24/13 Malon Siddall,RN,BSN [phone] MET WITH PT AND MOTHER TO [place].  PT TO DC HOME WITH MOTHER POST OP; MOM AND BF TO PROVIDE CARE.  BOTH DENY ANY HOME NEEDS AT THIS TIME.

## 2013-09-19 NOTE — Preoperative (Signed)
Beta Blockers   Reason not to administer Beta Blockers:Not Applicable 

## 2013-09-19 NOTE — Procedures (Signed)
Extubation Procedure Note  Patient Details:   Name: Paula Wolfe DOB: 10-Mar-1986 MRN: [id number]   Airway Documentation:      Evaluation  O2 sats: stable throughout Complications: No apparent complications Patient did tolerate procedure well. Bilateral Breath Sounds:  (coarse) Suctioning: Airway Yes, pt able to speak NIF -40, FVC 600cc, positive cuff leak, ABG WNL. Pt extubated to 3L [place] with no complications. No stridor. Pt oriented to self and place.   Fredrich Birks 09/19/2013, 8:35 PM

## 2013-09-19 NOTE — Anesthesia Preprocedure Evaluation (Addendum)
Anesthesia Evaluation  Patient identified by MRN, date of birth, ID band Patient awake    Reviewed: Allergy & Precautions, H&P , NPO status , Patient's Chart, lab work & pertinent test results  History of Anesthesia Complications (+) PONV  Airway Mallampati: II TM Distance: >3 FB Neck ROM: Full    Dental  (+) Teeth Intact and Dental Advisory Given   Pulmonary shortness of breath and with exertion,    Pulmonary exam normal       Cardiovascular Exercise Tolerance: Poor +CHF Rhythm:Regular Rate:Normal  09-13-13 ECHO  Left ventricle:  LVEF is approximately 50%  Aortic valve:   Structurally normal valve.   Cusp separation was normal.  Doppler:   No regurgitation. Aorta:  Descending aorta not well visualized. Mitral valve:  MV is mildly thickened. There is very mild prolapse of the anterior mitral leaflet. The posterior leaflet is shorter. MR is severe, directed posterior around back wall of L atrium. Using PISA though, MR volume is caculated at 48 cc.   Neuro/Psych  Headaches, negative psych ROS   GI/Hepatic negative GI ROS, Neg liver ROS,   Endo/Other  Hypothyroidism Morbid obesity  Renal/GU negative Renal ROS     Musculoskeletal   Abdominal   Peds  Hematology  (+) anemia ,   Anesthesia Other Findings   Reproductive/Obstetrics                       Anesthesia Physical Anesthesia Plan  ASA: IV  Anesthesia Plan: General   Post-op Pain Management:    Induction: Intravenous  Airway Management Planned: Double Lumen EBT  Additional Equipment: Arterial line, PA Cath and 3D TEE  Intra-op Plan:   Post-operative Plan: Post-operative intubation/ventilation  Informed Consent:   Dental advisory given  Plan Discussed with: CRNA, Anesthesiologist and Surgeon  Anesthesia Plan Comments:        Anesthesia Quick Evaluation

## 2013-09-19 NOTE — Brief Op Note (Addendum)
09/19/2013  12:57 PM  PATIENT:  Paula Wolfe  28 y.o. 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

## 2013-09-20 ENCOUNTER — Encounter (HOSPITAL_COMMUNITY): Payer: Self-pay | Admitting: Thoracic Surgery (Cardiothoracic Vascular Surgery)

## 2013-09-20 ENCOUNTER — Inpatient Hospital Stay (HOSPITAL_COMMUNITY): Payer: BC Managed Care – PPO

## 2013-09-20 LAB — MAGNESIUM
MAGNESIUM: 2.7 mg/dL — AB (ref 1.5–2.5)
Magnesium: 2.5 mg/dL (ref 1.5–2.5)

## 2013-09-20 LAB — CBC
HCT: 24.1 % — ABNORMAL LOW (ref 36.0–46.0)
HCT: 26.2 % — ABNORMAL LOW (ref 36.0–46.0)
HEMOGLOBIN: 8.2 g/dL — AB (ref 12.0–15.0)
Hemoglobin: 7.4 g/dL — ABNORMAL LOW (ref 12.0–15.0)
MCH: 22.8 pg — ABNORMAL LOW (ref 26.0–34.0)
MCH: 23.3 pg — AB (ref 26.0–34.0)
MCHC: 30.7 g/dL (ref 30.0–36.0)
MCHC: 31.3 g/dL (ref 30.0–36.0)
MCV: 74.2 fL — ABNORMAL LOW (ref 78.0–100.0)
MCV: 74.4 fL — ABNORMAL LOW (ref 78.0–100.0)
PLATELETS: 164 10*3/uL (ref 150–400)
PLATELETS: 187 10*3/uL (ref 150–400)
RBC: 3.25 MIL/uL — ABNORMAL LOW (ref 3.87–5.11)
RBC: 3.52 MIL/uL — ABNORMAL LOW (ref 3.87–5.11)
RDW: 19.3 % — AB (ref 11.5–15.5)
RDW: 20.1 % — ABNORMAL HIGH (ref 11.5–15.5)
WBC: 11 10*3/uL — AB (ref 4.0–10.5)
WBC: 17 10*3/uL — AB (ref 4.0–10.5)

## 2013-09-20 LAB — POCT I-STAT 3, ART BLOOD GAS (G3+)
Acid-base deficit: 5 mmol/L — ABNORMAL HIGH (ref 0.0–2.0)
Bicarbonate: 19.9 mEq/L — ABNORMAL LOW (ref 20.0–24.0)
O2 SAT: 93 %
PO2 ART: 73 mmHg — AB (ref 80.0–100.0)
Patient temperature: 38.5
TCO2: 21 mmol/L (ref 0–100)
pCO2 arterial: 35.1 mmHg (ref 35.0–45.0)
pH, Arterial: 7.367 (ref 7.350–7.450)

## 2013-09-20 LAB — POCT I-STAT, CHEM 8
BUN: 12 mg/dL (ref 6–23)
Calcium, Ion: 1.15 mmol/L (ref 1.12–1.23)
Chloride: 105 mEq/L (ref 96–112)
Creatinine, Ser: 1 mg/dL (ref 0.50–1.10)
Glucose, Bld: 108 mg/dL — ABNORMAL HIGH (ref 70–99)
HEMATOCRIT: 27 % — AB (ref 36.0–46.0)
HEMOGLOBIN: 9.2 g/dL — AB (ref 12.0–15.0)
POTASSIUM: 3.9 meq/L (ref 3.7–5.3)
SODIUM: 138 meq/L (ref 137–147)
TCO2: 23 mmol/L (ref 0–100)

## 2013-09-20 LAB — GLUCOSE, CAPILLARY
GLUCOSE-CAPILLARY: 94 mg/dL (ref 70–99)
GLUCOSE-CAPILLARY: 125 mg/dL — AB (ref 70–99)
GLUCOSE-CAPILLARY: 113 mg/dL — AB (ref 70–99)
GLUCOSE-CAPILLARY: 120 mg/dL — AB (ref 70–99)
Glucose-Capillary: 81 mg/dL (ref 70–99)
Glucose-Capillary: 98 mg/dL (ref 70–99)
Glucose-Capillary: 94 mg/dL (ref 70–99)
Glucose-Capillary: 135 mg/dL — ABNORMAL HIGH (ref 70–99)
Glucose-Capillary: 110 mg/dL — ABNORMAL HIGH (ref 70–99)

## 2013-09-20 LAB — BASIC METABOLIC PANEL
BUN: 13 mg/dL (ref 6–23)
CHLORIDE: 106 meq/L (ref 96–112)
CO2: 19 mEq/L (ref 19–32)
Calcium: 7.4 mg/dL — ABNORMAL LOW (ref 8.4–10.5)
Creatinine, Ser: 1.13 mg/dL — ABNORMAL HIGH (ref 0.50–1.10)
GFR calc Af Amer: 77 mL/min — ABNORMAL LOW (ref >90)
GFR, EST NON AFRICAN AMERICAN: 66 mL/min — AB (ref >90)
Glucose, Bld: 134 mg/dL — ABNORMAL HIGH (ref 70–99)
POTASSIUM: 4.2 meq/L (ref 3.7–5.3)
Sodium: 137 mEq/L (ref 137–147)

## 2013-09-20 LAB — CREATININE, SERUM
CREATININE: 1 mg/dL (ref 0.50–1.10)
GFR calc Af Amer: 89 mL/min — ABNORMAL LOW (ref >90)
GFR, EST NON AFRICAN AMERICAN: 77 mL/min — AB (ref >90)

## 2013-09-20 MED ORDER — KETOROLAC TROMETHAMINE 15 MG/ML IJ SOLN
15.0000 mg | Freq: Once | INTRAMUSCULAR | Status: AC
  Administered 2013-09-20: 15 mg via INTRAVENOUS
  Filled 2013-09-20: qty 1

## 2013-09-20 MED ORDER — FUROSEMIDE 10 MG/ML IJ SOLN
20.0000 mg | Freq: Four times a day (QID) | INTRAMUSCULAR | Status: AC
  Administered 2013-09-20 – 2013-09-21 (×3): 20 mg via INTRAVENOUS
  Filled 2013-09-20 (×3): qty 2

## 2013-09-20 MED ORDER — FUROSEMIDE 40 MG PO TABS
40.0000 mg | ORAL_TABLET | Freq: Every day | ORAL | Status: DC
  Administered 2013-09-20 – 2013-09-22 (×2): 40 mg via ORAL
  Filled 2013-09-20 (×4): qty 1

## 2013-09-20 MED ORDER — ASPIRIN EC 81 MG PO TBEC
81.0000 mg | DELAYED_RELEASE_TABLET | Freq: Every day | ORAL | Status: DC
  Administered 2013-09-20 – 2013-09-25 (×6): 81 mg via ORAL
  Filled 2013-09-20 (×6): qty 1

## 2013-09-20 MED ORDER — SODIUM CHLORIDE 0.9 % IV SOLN: INTRAVENOUS | Status: DC

## 2013-09-20 MED ORDER — AMIODARONE HCL 200 MG PO TABS
200.0000 mg | ORAL_TABLET | Freq: Two times a day (BID) | ORAL | Status: DC
  Administered 2013-09-21 – 2013-09-22 (×3): 200 mg via ORAL
  Filled 2013-09-20 (×4): qty 1

## 2013-09-20 MED ORDER — WARFARIN SODIUM 2.5 MG PO TABS
2.5000 mg | ORAL_TABLET | Freq: Every day | ORAL | Status: DC
  Administered 2013-09-20 – 2013-09-22 (×3): 2.5 mg via ORAL
  Filled 2013-09-20 (×4): qty 1

## 2013-09-20 MED ORDER — FERROUS SULFATE 325 (65 FE) MG PO TABS
325.0000 mg | ORAL_TABLET | Freq: Every day | ORAL | Status: DC
  Administered 2013-09-20 – 2013-09-25 (×6): 325 mg via ORAL
  Filled 2013-09-20 (×7): qty 1

## 2013-09-20 MED ORDER — WARFARIN - PHYSICIAN DOSING INPATIENT
Freq: Every day | Status: DC
  Administered 2013-09-23 – 2013-09-24 (×2)

## 2013-09-20 MED ORDER — MORPHINE SULFATE 2 MG/ML IJ SOLN
2.0000 mg | INTRAMUSCULAR | Status: DC | PRN
  Administered 2013-09-20 – 2013-09-21 (×5): 2 mg via INTRAVENOUS
  Filled 2013-09-20 (×5): qty 1

## 2013-09-20 MED ORDER — INSULIN ASPART 100 UNIT/ML ~~LOC~~ SOLN
0.0000 [IU] | SUBCUTANEOUS | Status: DC
  Administered 2013-09-20: 2 [IU] via SUBCUTANEOUS

## 2013-09-20 MED FILL — Lidocaine HCl IV Inj 20 MG/ML: INTRAVENOUS | Qty: 5 | Status: AC

## 2013-09-20 MED FILL — Mannitol IV Soln 20%: INTRAVENOUS | Qty: 500 | Status: AC

## 2013-09-20 MED FILL — Electrolyte-R (PH 7.4) Solution: INTRAVENOUS | Qty: 4000 | Status: AC

## 2013-09-20 MED FILL — Sodium Chloride IV Soln 0.9%: INTRAVENOUS | Qty: 2000 | Status: AC

## 2013-09-20 MED FILL — Heparin Sodium (Porcine) Inj 1000 Unit/ML: INTRAMUSCULAR | Qty: 30 | Status: AC

## 2013-09-20 MED FILL — Sodium Bicarbonate IV Soln 8.4%: INTRAVENOUS | Qty: 50 | Status: AC

## 2013-09-20 NOTE — Progress Notes (Signed)
[facility] BRIEF SICU PROGRESS NOTE  1 Day Post-Op  S/P Procedure(s) (LRB): MINIMALLY INVASIVE MITRAL VALVE REPAIR (MVR) OR REPLACEMENT (Right) INTRAOPERATIVE TRANSESOPHAGEAL ECHOCARDIOGRAM (N/A)   Stable day. Some increased soreness right chest with movement NSR/sinus tach BP stable off neo drip O2 sats 100% on 2 L/min UOP 30-40 mL/hr  Plan: Start lasix.  Try toradol for added analgesia  Savahanna Almendariz H 09/20/2013 7:21 PM

## 2013-09-20 NOTE — Progress Notes (Addendum)
[address]       [name] [address]             [phone]        CARDIOTHORACIC SURGERY PROGRESS NOTE   R1 Day Post-Op Procedure(s) (LRB): MINIMALLY INVASIVE MITRAL VALVE REPAIR (MVR) OR REPLACEMENT (Right) INTRAOPERATIVE TRANSESOPHAGEAL ECHOCARDIOGRAM (N/A)  Subjective: Complains of no pain but does have a wet sounding cough  Objective: Vital signs: BP Readings from Last 1 Encounters:  09/19/13 79/39   Pulse Readings from Last 1 Encounters:  09/20/13 82   Resp Readings from Last 1 Encounters:  09/20/13 24   Temp Readings from Last 1 Encounters:  09/20/13 100.6 F (38.1 C)     Hemodynamics: PAP: (19-44)/(6-26) 30/13 mmHg CO:  [3.7 L/min-7.1 L/min] 6.2 L/min CI:  [1.7 L/min/m2-3.3 L/min/m2] 2.9 L/min/m2  Physical Exam:  Rhythm:   Sinus  Breath sounds: Crackles in the lower lobes  Heart sounds:  RRR, no murmurs, rubs, or gallops  Incisions:  Clean and dry-no infection  Abdomen:  Soft and nontender  Extremities:  Warm and well perfused, distal pulse felt   Intake/Output from previous day: 02/05 0701 - 02/06 0700 In: 7268.9 [I.V.:5508.9; Blood:550; NG/GT:30; IV Piggyback:1180] Out: 5030 [Urine:2675; Blood:2055; Chest Tube:300] Intake/Output this shift:    Lab Results:  CBC: Recent Labs  09/19/13 2115 09/19/13 2148 09/20/13 0430  WBC 13.4*  --  11.0*  HGB 8.4* 9.9* 7.4*  HCT 27.2* 29.0* 24.1*  PLT 177  --  164    BMET:  Recent Labs  09/17/13 1122  09/19/13 2148 09/20/13 0430  NA 139  < > 141 137  K 4.0  < > 4.2 4.2  CL 102  --  107 106  CO2 21  --   --  19  GLUCOSE 91  < > 103* 134*  BUN 16  --  11 13  CREATININE 1.01  < > 0.90 1.13*  CALCIUM 9.1  --   --  7.4*  < > = values in this interval not displayed.   CBG (last 3)   Recent Labs  09/19/13 1935 09/19/13 2334 09/20/13 0345  GLUCAP 100* 116* 125*    ABG    Component Value Date/Time   PHART 7.367 09/20/2013 0425   PCO2ART 35.1 09/20/2013 0425   PO2ART 73.0* 09/20/2013 0425   HCO3 19.9* 09/20/2013 0425   TCO2 21 09/20/2013 0425   ACIDBASEDEF 5.0* 09/20/2013 0425   O2SAT 93.0 09/20/2013 0425    CXR: No pneumothorax, increased opacity in the right lower lobe.  Assessment/Plan: S/P Procedure(s) (LRB): MINIMALLY INVASIVE MITRAL VALVE REPAIR (MVR) OR REPLACEMENT (Right) INTRAOPERATIVE TRANSESOPHAGEAL ECHOCARDIOGRAM (N/A)  1. CV-BP 99/45 and still on On milrinone 0.3 mcg/kg/min and Neo 67mcg/min- was weaned off of Neo last night, but vagaled down when she was helped to stand up to weight. 2. Pulmonary- Extubated at 8:30pm last night 02 sats 99% on room air. Chest tube output minimal. Encouraged IS 3. Acute blood loss anemai-H & H 7.4 and 24.1-consider transfusion 4. Running a low grade fever all night-unsure of the source, WBC slight elevated 5. Off of insulin drip, last CBG was 134 6. Fluid overloaded- weight is up 9 lbs but Creatinine elevated to 1.13, BP is still on the low side. Wait to diuresis 7. Pain management- 4mg  of Morphine twice last night and 2mg  of Morphine when extubated. Oxycodone 10mg  given this morning.  7. Wean off drips as tolerated, discontinue tubes and lines as  tolerated, to chair today and walk if able.     Jari Favreonte, Tessa, PA-S 09/20/2013 7:34 AM   I have seen and examined the patient and agree with the assessment and plan as outlined.   Overall doing well POD1.   Maintaining NSR w/ stable hemodynamics.  PA pressures low.  Wean milrinone off. Still on low dose neo drip, wean as tolerated.   Expected acute blood loss anemia w/ significant iron-deficient anemia pre-op.  Hgb 7.4 this am.  Will watch for now Expected post op atelectasis, mobilize, pulm toilet Low grade fevers most likely related to atelectasis - monitor Expected post op volume excess, mild - hold diuretics until BP stable off Neo Leave chest tubes in for at least one more day. Start coumadin for planned 3 months duration unless she develops excessive  menstrual losses or other complications.  Evonne Rinks H 09/20/2013 9:48 AM

## 2013-09-20 NOTE — Plan of Care (Signed)
Problem: Phase II - Intermediate Post-Op Goal: Maintain Hemodynamic Stability Outcome: Progressing Neo gtt  Goal: CBGs/Blood Glucose per SCIP Criteria Outcome: Progressing Off insulin gtt Goal: Advance Diet Outcome: Progressing Pt tolerating water and ice chips

## 2013-09-20 NOTE — Plan of Care (Signed)
Problem: Phase II - Intermediate Post-Op Goal: Activity Progressed Outcome: Progressing Pt ambulated in hallway this shift, ~30 feet

## 2013-09-21 ENCOUNTER — Inpatient Hospital Stay (HOSPITAL_COMMUNITY): Payer: BC Managed Care – PPO

## 2013-09-21 LAB — BASIC METABOLIC PANEL
BUN: 13 mg/dL (ref 6–23)
CALCIUM: 8 mg/dL — AB (ref 8.4–10.5)
CO2: 23 meq/L (ref 19–32)
CREATININE: 1.04 mg/dL (ref 0.50–1.10)
Chloride: 103 mEq/L (ref 96–112)
GFR calc non Af Amer: 73 mL/min — ABNORMAL LOW (ref >90)
GFR, EST AFRICAN AMERICAN: 85 mL/min — AB (ref >90)
Glucose, Bld: 103 mg/dL — ABNORMAL HIGH (ref 70–99)
Potassium: 4.3 mEq/L (ref 3.7–5.3)
SODIUM: 137 meq/L (ref 137–147)

## 2013-09-21 LAB — GLUCOSE, CAPILLARY
Glucose-Capillary: 110 mg/dL — ABNORMAL HIGH (ref 70–99)
Glucose-Capillary: 100 mg/dL — ABNORMAL HIGH (ref 70–99)
Glucose-Capillary: 100 mg/dL — ABNORMAL HIGH (ref 70–99)
Glucose-Capillary: 109 mg/dL — ABNORMAL HIGH (ref 70–99)

## 2013-09-21 LAB — PROTIME-INR
INR: 1.39 (ref 0.00–1.49)
Prothrombin Time: 16.7 seconds — ABNORMAL HIGH (ref 11.6–15.2)

## 2013-09-21 LAB — CBC
HCT: 24.9 % — ABNORMAL LOW (ref 36.0–46.0)
Hemoglobin: 7.7 g/dL — ABNORMAL LOW (ref 12.0–15.0)
MCH: 23.2 pg — ABNORMAL LOW (ref 26.0–34.0)
MCHC: 30.9 g/dL (ref 30.0–36.0)
MCV: 75 fL — ABNORMAL LOW (ref 78.0–100.0)
PLATELETS: 184 10*3/uL (ref 150–400)
RBC: 3.32 MIL/uL — AB (ref 3.87–5.11)
RDW: 20.5 % — AB (ref 11.5–15.5)
WBC: 15.6 10*3/uL — AB (ref 4.0–10.5)

## 2013-09-21 MED ORDER — SODIUM CHLORIDE 0.9 % IJ SOLN
3.0000 mL | Freq: Two times a day (BID) | INTRAMUSCULAR | Status: DC
  Administered 2013-09-21 – 2013-09-25 (×6): 3 mL via INTRAVENOUS

## 2013-09-21 MED ORDER — TRAMADOL HCL 50 MG PO TABS
50.0000 mg | ORAL_TABLET | ORAL | Status: DC | PRN
  Administered 2013-09-21: 100 mg via ORAL
  Administered 2013-09-24 (×2): 50 mg via ORAL
  Administered 2013-09-24 – 2013-09-25 (×3): 100 mg via ORAL
  Filled 2013-09-21 (×4): qty 2
  Filled 2013-09-21 (×2): qty 1

## 2013-09-21 MED ORDER — SODIUM CHLORIDE 0.9 % IJ SOLN: 3.0000 mL | INTRAMUSCULAR | Status: DC | PRN

## 2013-09-21 MED ORDER — METOPROLOL TARTRATE 25 MG PO TABS
25.0000 mg | ORAL_TABLET | Freq: Two times a day (BID) | ORAL | Status: DC
  Administered 2013-09-21 – 2013-09-24 (×7): 25 mg via ORAL
  Filled 2013-09-21 (×9): qty 1

## 2013-09-21 MED ORDER — POTASSIUM CHLORIDE CRYS ER 20 MEQ PO TBCR
20.0000 meq | EXTENDED_RELEASE_TABLET | Freq: Two times a day (BID) | ORAL | Status: AC
  Administered 2013-09-21 – 2013-09-23 (×6): 20 meq via ORAL
  Filled 2013-09-21 (×6): qty 1

## 2013-09-21 MED ORDER — SODIUM CHLORIDE 0.9 % IV SOLN: 250.0000 mL | INTRAVENOUS | Status: DC | PRN

## 2013-09-21 MED ORDER — MOVING RIGHT ALONG BOOK
Freq: Once | Status: AC
  Administered 2013-09-21: 1
  Filled 2013-09-21: qty 1

## 2013-09-21 NOTE — Progress Notes (Signed)
Pt reluctantly ambulated 300 ft pushing wheelchair holding chest tube. She tolerated walk without complaints of increased pain or shob. HR max 118 during walk. Pt strongly encouraged to be out of bed more and use IS q 1 hour. Family at bedside providing encouragement.

## 2013-09-21 NOTE — Progress Notes (Addendum)
[address]       [name] [address]             [phone]        CARDIOTHORACIC SURGERY PROGRESS NOTE   R2 Days Post-Op Procedure(s) (LRB): MINIMALLY INVASIVE MITRAL VALVE REPAIR (MVR) OR REPLACEMENT (Right) INTRAOPERATIVE TRANSESOPHAGEAL ECHOCARDIOGRAM (N/A)  Subjective: Looks good.  Mild soreness.  No SOB.  Appetite improving.  Objective: Vital signs: BP Readings from Last 1 Encounters:  09/21/13 126/63   Pulse Readings from Last 1 Encounters:  09/21/13 107   Resp Readings from Last 1 Encounters:  09/21/13 24   Temp Readings from Last 1 Encounters:  09/21/13 98.1 F (36.7 C) Oral    Hemodynamics: PAP: (48-61)/(24-34) 61/26 mmHg CO:  [7.6 L/min-7.8 L/min] 7.8 L/min CI:  [3.5 L/min/m2-3.7 L/min/m2] 3.7 L/min/m2  Physical Exam:  Rhythm:   sinus  Breath sounds: Few rhonchi  Heart sounds:  RRR w/ no murmur  Incisions:  Dressing dry, intact  Abdomen:  Soft, non-distended, non-tender  Extremities:  Warm, well-perfused    Intake/Output from previous day: 02/06 0701 - 02/07 0700 In: 1414.1 [P.O.:760; I.V.:554.1; IV Piggyback:100] Out: 1695 [Urine:1285; Chest Tube:410] Intake/Output this shift: Total I/O In: 40 [I.V.:40] Out: 90 [Urine:60; Chest Tube:30]  Lab Results:  CBC: Recent Labs  09/20/13 1700 09/20/13 1733 09/21/13 0424  WBC 17.0*  --  15.6*  HGB 8.2* 9.2* 7.7*  HCT 26.2* 27.0* 24.9*  PLT 187  --  184    BMET:  Recent Labs  09/20/13 0430  09/20/13 1733 09/21/13 0424  NA 137  --  138 137  K 4.2  --  3.9 4.3  CL 106  --  105 103  CO2 19  --   --  23  GLUCOSE 134*  --  108* 103*  BUN 13  --  12 13  CREATININE 1.13*  < > 1.00 1.04  CALCIUM 7.4*  --   --  8.0*  < > = values in this interval not displayed.   CBG (last 3)   Recent Labs  09/20/13 1948 09/21/13 0010 09/21/13 0419  GLUCAP 120* 110* 100*    ABG    Component Value Date/Time   PHART 7.367 09/20/2013 0425   PCO2ART 35.1 09/20/2013 0425   PO2ART 73.0* 09/20/2013 0425   HCO3 19.9* 09/20/2013 0425   TCO2 23 09/20/2013 1733   ACIDBASEDEF 5.0* 09/20/2013 0425   O2SAT 93.0 09/20/2013 0425    CXR: CLINICAL DATA: 28 year old female status post cardiac surgery.  Initial encounter.  EXAM:  PORTABLE CHEST - 1 VIEW  COMPARISON: 09/20/2013 and earlier.  FINDINGS:  Portable AP semi upright view at 0553 hrs.  Left IJ approach Swan-Ganz catheter has been removed. Introducer  sheath remains. Epicardial pacer wires remain. Two right chest tubes  remain in place. No pneumothorax identified.  Lower right lung volume. Increased perihilar atelectasis. Stable  cardiac size and mediastinal contours. Postoperative changes to the  right axilla. Decreased gaseous distension of the stomach.  IMPRESSION:  1. Swan-Ganz catheter removed, left IJ introducer sheath remains.  2. Stable right chest tubes. No pneumothorax. Increased atelectasis.  Electronically Signed  By: Augusto Gamble M.D.   Assessment/Plan: S/P Procedure(s) (LRB): MINIMALLY INVASIVE MITRAL VALVE REPAIR (MVR) OR REPLACEMENT (Right) INTRAOPERATIVE TRANSESOPHAGEAL ECHOCARDIOGRAM (N/A)  Doing well POD2 Maintaining NSR w/ stable BP Expected post op acute blood loss anemia, chronic iron-deficient anemia pre-op - Hgb stable Expected post op volume excess, diuresing Expected post op  atelectasis, mild, increased   Mobilize  Diuresis  Pulm toilet  Leave chest tubes one more day  Continue coumadin  Low dose ASA until therapeutic on coumadin  Prophylactic amiodarone  Transfer step down   OWEN,CLARENCE H 09/21/2013 10:19 AM

## 2013-09-21 NOTE — Progress Notes (Signed)
Pt ambulated in hallway 250 ft with wheelchair on room air and tolerated activity well. Pt encouraged to use her incentive, currently getting 250 on IS. Stressed importance of IS and walking. Pt resting in bed with family at bedside. Will continue to monitor.

## 2013-09-22 ENCOUNTER — Inpatient Hospital Stay (HOSPITAL_COMMUNITY): Payer: BC Managed Care – PPO

## 2013-09-22 LAB — BASIC METABOLIC PANEL
BUN: 15 mg/dL (ref 6–23)
CHLORIDE: 103 meq/L (ref 96–112)
CO2: 23 meq/L (ref 19–32)
Calcium: 8.3 mg/dL — ABNORMAL LOW (ref 8.4–10.5)
Creatinine, Ser: 0.9 mg/dL (ref 0.50–1.10)
GFR calc Af Amer: >90 mL/min (ref >90)
GFR calc non Af Amer: 87 mL/min — ABNORMAL LOW (ref >90)
GLUCOSE: 112 mg/dL — AB (ref 70–99)
POTASSIUM: 4.1 meq/L (ref 3.7–5.3)
Sodium: 137 mEq/L (ref 137–147)

## 2013-09-22 LAB — CBC
HCT: 24.7 % — ABNORMAL LOW (ref 36.0–46.0)
HEMOGLOBIN: 7.5 g/dL — AB (ref 12.0–15.0)
MCH: 22.9 pg — ABNORMAL LOW (ref 26.0–34.0)
MCHC: 30.4 g/dL (ref 30.0–36.0)
MCV: 75.3 fL — ABNORMAL LOW (ref 78.0–100.0)
PLATELETS: 181 10*3/uL (ref 150–400)
RBC: 3.28 MIL/uL — AB (ref 3.87–5.11)
RDW: 21 % — ABNORMAL HIGH (ref 11.5–15.5)
WBC: 13.4 10*3/uL — AB (ref 4.0–10.5)

## 2013-09-22 LAB — PROTIME-INR
INR: 1.24 (ref 0.00–1.49)
PROTHROMBIN TIME: 15.3 s — AB (ref 11.6–15.2)

## 2013-09-22 MED ORDER — LISINOPRIL 2.5 MG PO TABS
2.5000 mg | ORAL_TABLET | Freq: Every day | ORAL | Status: DC
  Administered 2013-09-22: 2.5 mg via ORAL
  Filled 2013-09-22 (×2): qty 1

## 2013-09-22 MED ORDER — DOCUSATE SODIUM 100 MG PO CAPS
200.0000 mg | ORAL_CAPSULE | Freq: Two times a day (BID) | ORAL | Status: DC
  Administered 2013-09-22 – 2013-09-25 (×3): 200 mg via ORAL
  Filled 2013-09-22 (×6): qty 2

## 2013-09-22 MED ORDER — LACTULOSE 10 GM/15ML PO SOLN
30.0000 g | Freq: Every day | ORAL | Status: DC | PRN
  Filled 2013-09-22 (×2): qty 45

## 2013-09-22 NOTE — Progress Notes (Signed)
Pt's chest tube removed per MD order and protocol. Pt tolerated procedure well. Pt resting in bed with family at bedside. Will continue to monitor.

## 2013-09-22 NOTE — Progress Notes (Signed)
Pt ambulated in hallway reluctantly 150 ft with wheelchair on room air. Pt tolerated activity well. Will continue to monitor.

## 2013-09-22 NOTE — Progress Notes (Addendum)
       [address]       [place] [address]             [phone]          3 Days Post-Op Procedure(s) (LRB): MINIMALLY INVASIVE MITRAL VALVE REPAIR (MVR) OR REPLACEMENT (Right) INTRAOPERATIVE TRANSESOPHAGEAL ECHOCARDIOGRAM (N/A)  Subjective: Feels well except sore.  Not much appetite, but no nausea.   Objective: Vital signs in last 24 hours: Patient Vitals for the past 24 hrs:  BP Temp Temp src Pulse Resp SpO2 Weight  09/22/13 0433 118/77 mmHg 98.4 F (36.9 C) Oral 90 20 95 % -  09/22/13 0430 - - - - - - 267 lb (121.11 kg)  09/22/13 0002 133/84 mmHg - - 102 - 96 % -  09/21/13 1933 123/81 mmHg 99.6 F (37.6 C) Oral 107 - 92 % -  09/21/13 1736 - 101.3 F (38.5 C) Axillary 110 - - -  09/21/13 1438 117/58 mmHg 101.1 F (38.4 C) Axillary 101 - 96 % -  09/21/13 1227 - 98.4 F (36.9 C) Oral - - - -  09/21/13 1200 133/105 mmHg - - 112 8 99 % -  09/21/13 1000 125/65 mmHg - - 111 - - -  09/21/13 0819 - 98.1 F (36.7 C) Oral - - - -   Current Weight  09/22/13 267 lb (121.11 kg)  BASELINE WEIGHT: 118 kg    Intake/Output from previous day: 02/07 0701 - 02/08 0700 In: 970 [P.O.:930; I.V.:40] Out: 865 [Urine:655; Chest Tube:210]    PHYSICAL EXAM:  Heart: RRR Lungs: Decreased BS in bases Wound: Clean and dry Extremities: Trace LE edema    Lab Results: CBC: Recent Labs  09/21/13 0424 09/22/13 0405  WBC 15.6* 13.4*  HGB 7.7* 7.5*  HCT 24.9* 24.7*  PLT 184 181   BMET:  Recent Labs  09/21/13 0424 09/22/13 0405  NA 137 137  K 4.3 4.1  CL 103 103  CO2 23 23  GLUCOSE 103* 112*  BUN 13 15  CREATININE 1.04 0.90  CALCIUM 8.0* 8.3*    PT/INR:  Recent Labs  09/22/13 0405  LABPROT 15.3*  INR 1.24    CXR: FINDINGS:  Two chest tubes are again noted on the right. The overall appearance  is stable. No recurrent pneumothorax is seen. Persistent increased  density is noted within the right level consistent with atelectasis.    Postsurgical changes are again noted. The cardiac shadow is stable.  The upper abdomen is within normal limits.  IMPRESSION:  Stable postoperative change on the right. No acute abnormality is  seen.   Assessment/Plan: S/P Procedure(s) (LRB): MINIMALLY INVASIVE MITRAL VALVE REPAIR (MVR) OR REPLACEMENT (Right) INTRAOPERATIVE TRANSESOPHAGEAL ECHOCARDIOGRAM (N/A)  CV- maintaining SR, BPs stable. Continue Amio, Lopressor, Coumadin.  Vol overload- diurese.  CT output decreased, 140 and 70 ml over past 2 shifts respectively.  Hopefully can d/c remaining CT today.  Expected postop blood loss anemia- H/H stable, continue Fe.  Tm 101.3 overnight, likely atelectasis.  WBC stable.  Encouraged pulm toilet/IS.  CRPI.   LOS: 3 days    COLLINS,GINA H 09/22/2013  I have seen and examined the patient and agree with the assessment and plan as outlined.  D/C chest tubes today.  Mobilize.  Reports poor appetite and some nausea.  Will d/c amiodarone.  Add low dose ACE-I  Giana Castner H 09/22/2013 11:44 AM

## 2013-09-22 NOTE — Progress Notes (Signed)
EPws removed per MD order and protocol. Pt developed sudden SOB and chest pain and pressure. PA paged and aware. O2 sats dropped to 82 % on room air. Orders given for stat PCXR. Pt placed on 2 liters on 0 2. Will continue to monitor.

## 2013-09-23 ENCOUNTER — Inpatient Hospital Stay (HOSPITAL_COMMUNITY): Payer: BC Managed Care – PPO

## 2013-09-23 LAB — PROTIME-INR
INR: 1.03 (ref 0.00–1.49)
Prothrombin Time: 13.3 seconds (ref 11.6–15.2)

## 2013-09-23 LAB — GLUCOSE, CAPILLARY: Glucose-Capillary: 115 mg/dL — ABNORMAL HIGH (ref 70–99)

## 2013-09-23 MED ORDER — WARFARIN SODIUM 5 MG PO TABS
5.0000 mg | ORAL_TABLET | Freq: Every day | ORAL | Status: DC
  Filled 2013-09-23: qty 1

## 2013-09-23 MED ORDER — FUROSEMIDE 40 MG PO TABS
40.0000 mg | ORAL_TABLET | Freq: Once | ORAL | Status: AC
  Administered 2013-09-23: 40 mg via ORAL
  Filled 2013-09-23: qty 1

## 2013-09-23 MED ORDER — WARFARIN SODIUM 5 MG PO TABS
5.0000 mg | ORAL_TABLET | Freq: Every day | ORAL | Status: DC
  Administered 2013-09-23 – 2013-09-24 (×2): 5 mg via ORAL
  Filled 2013-09-23: qty 1

## 2013-09-23 MED ORDER — FUROSEMIDE 10 MG/ML IJ SOLN
40.0000 mg | Freq: Two times a day (BID) | INTRAMUSCULAR | Status: DC
  Administered 2013-09-23: 40 mg via INTRAVENOUS
  Filled 2013-09-23: qty 4

## 2013-09-23 MED ORDER — POTASSIUM CHLORIDE CRYS ER 20 MEQ PO TBCR
40.0000 meq | EXTENDED_RELEASE_TABLET | Freq: Once | ORAL | Status: AC
Start: 2013-09-23 — End: 2013-09-23
  Administered 2013-09-23: 40 meq via ORAL
  Filled 2013-09-23: qty 2

## 2013-09-23 MED ORDER — PNEUMOCOCCAL VAC POLYVALENT 25 MCG/0.5ML IJ INJ: 0.5000 mL | INJECTION | INTRAMUSCULAR | Status: DC

## 2013-09-23 MED ORDER — FOLIC ACID 1 MG PO TABS
1.0000 mg | ORAL_TABLET | Freq: Every day | ORAL | Status: DC
  Administered 2013-09-23 – 2013-09-25 (×3): 1 mg via ORAL
  Filled 2013-09-23 (×3): qty 1

## 2013-09-23 MED ORDER — FUROSEMIDE 40 MG PO TABS
40.0000 mg | ORAL_TABLET | Freq: Every day | ORAL | Status: DC
  Administered 2013-09-25: 40 mg via ORAL
  Filled 2013-09-23 (×2): qty 1

## 2013-09-23 MED ORDER — WARFARIN SODIUM 5 MG PO TABS: 5.0000 mg | ORAL_TABLET | Freq: Once | ORAL | Status: DC

## 2013-09-23 MED ORDER — LISINOPRIL 5 MG PO TABS
5.0000 mg | ORAL_TABLET | Freq: Every day | ORAL | Status: DC
  Administered 2013-09-23 – 2013-09-25 (×3): 5 mg via ORAL
  Filled 2013-09-23 (×3): qty 1

## 2013-09-23 MED FILL — Heparin Sodium (Porcine) Inj 1000 Unit/ML: INTRAMUSCULAR | Qty: 30 | Status: AC

## 2013-09-23 MED FILL — Magnesium Sulfate Inj 50%: INTRAMUSCULAR | Qty: 10 | Status: AC

## 2013-09-23 MED FILL — Potassium Chloride Inj 2 mEq/ML: INTRAVENOUS | Qty: 40 | Status: AC

## 2013-09-23 NOTE — Discharge Summary (Signed)
[address]       [place] [address]             [phone]       Shamarie Call 10-16-1985 27 y.o. [id number] I. 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 The patient and her parents state that she was told she had a heart murmur and leaky heart valve during childhood. There is no history of rheumatic fever. She was never formally evaluated by a cardiologist. The patient states that for several months she has experienced vague tightness across her chest with exertion and worsening orthopnea. She developed somewhat sudden onset of resting shortness of breath prompting presentation to the emergency department 09/12/2013. She tried to relax but her breathing got worse and became associated with cough producing pink, frothy secretions, ultimately causing her to call EMS. In the ED she underwent CT angiogram of the chest which ruled out pulmonary embolus but was notable for the presence of interstitial pulmonary edema. A transthoracic echocardiogram was performed demonstrating severe mitral regurgitation with mild left ventricular systolic dysfunction. Cardiology was consulted. She subsequently underwent transesophageal echocardiogram which confirmed the presence of severe mitral regurgitation and mild left ventricular dysfunction. Left and right heart catheterization was performed revealing normal coronary artery anatomy with no significant coronary artery disease. There was moderate to severe pulmonary hypertension with large V waves seen on pulmonary capillary wedge tracing. Cardiothoracic surgical consultation was requested. I saw her in consultation she remained in the hospital on 09/13/2013. She was discharged over the weekend and returns to our office with her entire  family present to discuss treatment options further in the office today. She reports that since she was discharged from the hospital she has felt well and she specifically denies any symptoms of shortness of breath or chest discomfort. The remainder of her review of systems is unchanged from previously.  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 Her parents and family live in [place]. She has been overweight most of her life. She has lost nearly 30 pounds in weight over the past 6 months using a diet and exercise program intended to get in better shape. She states that in retrospect she's been having some tightness across her chest with exertion off and on for a long time. The tightness and shortness of breath are made worse by lying flat in bed, and she has had several episodes of PND in recent months. She has had frequent tachypalpitations without disease spells or syncope. She's had lower extremity edema, worse on the left than the right. She is readmitted for the procedure.   Past Medical History   Diagnosis  Date   .  Hypothyroidism    .  Nontoxic multinodular goiter      a. Pt reports prior ultrasound/bx that were negative but was told she would need to keep an eye on them in the future. Benign FNA per [facility] for this dx.   .  Menometrorrhagia      a. Improved after tx of hypothyroidism, but recurred 05/2013.   .  Broken foot      a. Remote broken foot with residual chronic LLE edema.   .  Acute on chronic combined systolic and diastolic heart failure  09/12/2013   .  Mitral regurgitation  09/12/2013     severe by TEE   .  Multinodular goiter    .  Pulmonary hypertension  09/13/2013   .  Obesity    .  Anemia      iron deficient, attributed to menometrorrhagia   .  Chronic combined systolic and diastolic CHF (congestive heart failure)    .  Morbid obesity with BMI of 45.0-49.9, adult  09/16/2013   .  H/O syncope      prior to  finding out about the mitral regurgitation   .  Shortness of breath      sometimes with exertion   .  Headache(784.0)      headaches    Past Surgical History   Procedure  Laterality  Date   .  External ear surgery      Family History   Problem  Relation  Age of Onset   .  Hypertension  Mother    .  Hypertension  Sister    .  Hypertension  Father    .  Heart Problems  Maternal Grandfather      details unclear    Social History  History   Substance Use Topics   .  Smoking status:  Never Smoker   .  Smokeless tobacco:  Never Used   .  Alcohol Use:  Yes      Comment: occasionally - 1-2x/month    Prior to Admission medications   Medication  Sig  Start Date  End Date  Taking?  Authorizing Provider   amiodarone (PACERONE) 200 MG tablet  Take 1 tablet (200 mg total) by mouth 2 (two) times daily. Begin 7 days prior to surgery.  09/16/13   Yes  Purcell Nailslarence H Owen, MD   Ferrous Sulfate (FER-IRON PO)  Take 80 mg by mouth 2 (two) times daily.    Yes  Historical Provider, MD   levothyroxine (SYNTHROID, LEVOTHROID) 75 MCG tablet  Take 75 mcg by mouth daily before breakfast.    Yes  Historical Provider, MD   furosemide (LASIX) 40 MG tablet  Take 1 tablet (40 mg total) by mouth daily. Do not fill if this has already been filled today...jcarter, r.n.  09/16/13    Purcell Nailslarence H Owen, MD   No Known Allergies    Hospital Course:  The patient was admitted to the hospital and taken to the operating room on 09/19/2013 and underwent Procedure(s):   CARDIOTHORACIC SURGERY OPERATIVE NOTE  Date of Procedure: 09/19/2013  Preoperative Diagnosis: Severe Mitral Regurgitation  Postoperative Diagnosis: Same  Procedure:  Minimally-Invasive Mitral Valve Repair Sorin Memo 3D Ring Annuloplasty (size 26mm, catalog # [id number], serial # [id number])  Surgeon: Salvatore Decentlarence H. Cornelius Moraswen, MD  Assistant: Ardelle Ballsonielle M. 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 The patient was reintubated using a single lumen endotracheal tube and subsequently transported to the surgical intensive care unit in stable condition. There were no intraoperative complications. All sponge instrument and needle counts are verified correct at completion of the operation.   Postoperative hospital course: She initially required some inotropic support. She was extubated the evening of surgery without difficulty. She  has remained neurologically intact. She has a moderate volume overload but is responding well to diuresis which will continue as an outpatient.. All routine lines, monitors and drainage devices have been discontinued in the standard fashion. She has been started on low-dose aspirin and Coumadin for plan 3 months. She is tolerating gradually increasing activities using standard protocols but is limited somewhat by her morbid obesity. She has had some postoperative nausea and relatively poor appetite. She was on prophylactic amiodarone but this was discontinued due to the nausea. She has not had any significant postoperative cardiac dysrhythmias. Incisions are healing well without evidence of infection. Blood sugars have been under good control using standard protocols. She did have ABL anemia and her H and H went as low as 6.3 and 20.2. She was given two units of PRBCs. Her H and H then went up to 8.1/24. She was already on oral ferrous sulfate and folic acid. A 2 D echo was obtained. It looked good although poor acoustic windows.she was seen by Dr. ON this morning and felt to be stable for discharge at this time.     Recent  Labs  09/24/13 0406 09/25/13 0320  NA 141 142  K 4.1 4.1  CL 104 104  CO2 22 24  GLUCOSE 92 93  BUN 13 13  CALCIUM 8.5 8.6    Recent Labs  09/24/13 0650 09/25/13 0320  WBC 13.7* 12.4*  HGB 6.3* 8.1*  HCT 20.2* 24.9*  PLT 349 311    Recent Labs  09/24/13 0406 09/25/13 0320  INR 1.15 1.40     Discharge Instructions:  The patient is discharged to home with extensive instructions on wound care and progressive ambulation.  They are instructed not to drive or perform any heavy lifting until returning to see the physician in his office.  Discharge Diagnosis:  SEVERE MR  Secondary Diagnosis: Patient Active Problem List   Diagnosis Date Noted  . Non-ischemic cardiomyopathy   . S/P minimally invasive mitral valve repair 09/19/2013  . Severe obesity (BMI >= 40) 09/16/2013  . Morbid obesity with BMI of 45.0-49.9, adult 09/16/2013  . Severe mitral regurgitation 09/16/2013  . Chronic combined systolic and diastolic CHF (congestive heart failure)   . Hypothyroidism 09/13/2013  . Multinodular goiter 09/13/2013  . Pulmonary hypertension 09/13/2013  . Obesity 09/13/2013  . Anemia, iron deficiency 09/13/2013  . SOB (shortness of breath) 09/12/2013  . Mitral regurgitation 09/12/2013   Past Medical History  Diagnosis Date  . Hypothyroidism   . Nontoxic multinodular goiter     a. Pt reports prior ultrasound/bx that were negative but was told she would need to keep an eye on them in the future. Benign FNA per [facility] for this dx.  . Menometrorrhagia     a. Improved after tx of hypothyroidism, but recurred 05/2013.  . Broken foot     a. Remote broken foot with residual chronic LLE edema.  . Acute on chronic combined systolic and diastolic heart failure 09/12/2013  . Mitral regurgitation 09/12/2013    severe by TEE  . Multinodular goiter   . Pulmonary hypertension 09/13/2013  . Obesity   . Anemia     iron deficient, attributed to menometrorrhagia  . Chronic combined  systolic and diastolic CHF (congestive heart failure)   . Morbid obesity with BMI of 45.0-49.9, adult 09/16/2013  . H/O syncope     prior to finding out about the mitral regurgitation  . Shortness of breath     sometimes with exertion  .  Headache(784.0)     headaches  . 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 Non-ischemic cardiomyopathy     Medications at discharge:   Medication List    STOP taking these medications       amiodarone 200 MG tablet  Commonly known as:  PACERONE      TAKE these medications       aspirin 81 MG EC tablet  Take 1 tablet (81 mg total) by mouth daily.     FER-IRON PO  Take 80 mg by mouth 2 (two) times daily.     folic acid 1 MG tablet  Commonly known as:  FOLVITE  Take 1 tablet (1 mg total) by mouth daily.     furosemide 40 MG tablet  Commonly known as:  LASIX  Take 1 tablet (40 mg total) by mouth daily.     levothyroxine 75 MCG tablet  Commonly known as:  SYNTHROID, LEVOTHROID  Take 75 mcg by mouth daily before breakfast.     lisinopril 5 MG tablet  Commonly known as:  PRINIVIL,ZESTRIL  Take 1 tablet (5 mg total) by mouth daily.     metoprolol 50 MG tablet  Commonly known as:  LOPRESSOR  Take 1 tablet (50 mg total) by mouth 2 (two) times daily.     oxyCODONE 5 MG immediate release tablet  Commonly known as:  Oxy IR/ROXICODONE  Take 1-2 tablets (5-10 mg total) by mouth every 4 (four) hours as needed for moderate pain.     warfarin 5 MG tablet  Commonly known as:  COUMADIN  Take 1 tablet (5 mg total) by mouth daily at 6 PM.       Follow-up Information   Follow up with Purcell Nails, MD. (10/07/2013 at 9:30 AM to see Dr. Cornelius Moras. Please obtain a chest x-ray at [facility] imaging at 8:30 AM. [facility] imaging is located in the same office complex.)    Specialty:  Cardiothoracic Surgery   Contact information:   [address] [place] [address] [phone]       Follow up with Lesleigh Noe, MD. (Please contact his office and to arrange to see in 2 weeks. Additionally, please have PT/INR blood test on Friday of this week so your Coumadin dose can be adjusted. Please call to arrange.)    Specialty:  Cardiology   Contact information:   [address] [place] [address] [phone]      Disposition: Stable  Patient's condition is Harlin Heys, PA-C 09/25/2013  11:42 AM

## 2013-09-23 NOTE — Progress Notes (Signed)
Pt ambulated 150 feet pushing wheelchair; pt to chair after ambulation; HR up to 135 while ambulating; HR down to 114 ST once sitting in chair; mother in room; call bell w/i reach; pt denies dizziness during walk; SOB after ambulation; sats 94% RA; will cont. To monitor.

## 2013-09-23 NOTE — Discharge Instructions (Signed)
Information on my medicine - Coumadin   (Warfarin)  This medication education was reviewed with me or my healthcare representative as part of my discharge preparation.  The pharmacist that spoke with me during my hospital stay was:  Lucia Gaskinsham, Shanna Un P, [facility]  Why was Coumadin prescribed for you? Coumadin was prescribed for you because you have a blood clot or a medical condition that can cause an increased risk of forming blood clots. Blood clots can cause serious health problems by blocking the flow of blood to the heart, lung, or brain. Coumadin can prevent harmful blood clots from forming. As a reminder your indication for Coumadin is:   Blood Clot Prevention After Heart Valve Surgery  What test will check on my response to Coumadin? While on Coumadin (warfarin) you will need to have an INR test regularly to ensure that your dose is keeping you in the desired range. The INR (international normalized ratio) number is calculated from the result of the laboratory test called prothrombin time (PT).  If an INR APPOINTMENT HAS NOT ALREADY BEEN MADE FOR YOU please schedule an appointment to have this lab work done by your health care provider within 7 days. Your INR goal is usually a number between:  2 to 3 or your provider may give you a more narrow range like 2-2.5.  Ask your health care provider during an office visit what your goal INR is.  What  do you need to  know  About  COUMADIN? Take Coumadin (warfarin) exactly as prescribed by your healthcare provider about the same time each day.  DO NOT stop taking without talking to the doctor who prescribed the medication.  Stopping without other blood clot prevention medication to take the place of Coumadin may increase your risk of developing a new clot or stroke.  Get refills before you run out.  What do you do if you miss a dose? If you miss a dose, take it as soon as you remember on the same day then continue your regularly scheduled regimen the next day.   Do not take two doses of Coumadin at the same time.  Important Safety Information A possible side effect of Coumadin (Warfarin) is an increased risk of bleeding. You should call your healthcare provider right away if you experience any of the following:   Bleeding from an injury or your nose that does not stop.   Unusual colored urine (red or dark brown) or unusual colored stools (red or black).   Unusual bruising for unknown reasons.   A serious fall or if you hit your head (even if there is no bleeding).  Some foods or medicines interact with Coumadin (warfarin) and might alter your response to warfarin. To help avoid this:   Eat a balanced diet, maintaining a consistent amount of Vitamin K.   Notify your provider about major diet changes you plan to make.   Avoid alcohol or limit your intake to 1 drink for women and 2 drinks for men per day. (1 drink is 5 oz. wine, 12 oz. beer, or 1.5 oz. liquor.)  Make sure that ANY health care provider who prescribes medication for you knows that you are taking Coumadin (warfarin).  Also make sure the healthcare provider who is monitoring your Coumadin knows when you have started a new medication including herbals and non-prescription products.  Coumadin (Warfarin)  Major Drug Interactions  Increased Warfarin Effect Decreased Warfarin Effect  Alcohol (large quantities) Antibiotics (esp. Septra/Bactrim, Flagyl, Cipro) Amiodarone (Cordarone)  Aspirin (ASA) Cimetidine (Tagamet) Megestrol (Megace) NSAIDs (ibuprofen, naproxen, etc.) Piroxicam (Feldene) Propafenone (Rythmol SR) Propranolol (Inderal) Isoniazid (INH) Posaconazole (Noxafil) Barbiturates (Phenobarbital) Carbamazepine (Tegretol) Chlordiazepoxide (Librium) Cholestyramine (Questran) Griseofulvin Oral Contraceptives Rifampin Sucralfate (Carafate) Vitamin K   Coumadin (Warfarin) Major Herbal Interactions  Increased Warfarin Effect Decreased Warfarin Effect  Garlic Ginseng Ginkgo  biloba Coenzyme Q10 Green tea St. Johns wort    Coumadin (Warfarin) FOOD Interactions  Eat a consistent number of servings per week of foods HIGH in Vitamin K (1 serving =  cup)  Collards (cooked, or boiled & drained) Kale (cooked, or boiled & drained) Mustard greens (cooked, or boiled & drained) Parsley *serving size only =  cup Spinach (cooked, or boiled & drained) Swiss chard (cooked, or boiled & drained) Turnip greens (cooked, or boiled & drained)  Eat a consistent number of servings per week of foods MEDIUM-HIGH in Vitamin K (1 serving = 1 cup)  Asparagus (cooked, or boiled & drained) Broccoli (cooked, boiled & drained, or raw & chopped) Brussel sprouts (cooked, or boiled & drained) *serving size only =  cup Lettuce, raw (green leaf, endive, romaine) Spinach, raw Turnip greens, raw & chopped   These websites have more information on Coumadin (warfarin):  http://www.king-russell.com/; https://www.hines.net/;   Mitral Valve Replacement, Care After  Refer to this sheet in the next few weeks. These instructions provide you with information on caring for yourself after your procedure. Your health care provider may also give you specific instructions. Your treatment has been planned according to current medical practices, but problems sometimes occur. Call your health care provider if you have any problems or questions after your procedure.  HOME CARE INSTRUCTIONS   Only take over-the-counter or prescription medicines as directed by your health care provider.  Take your temperature every morning for the first 7 days after surgery. Write these down.  Weigh yourself every morning for at least 7 days after surgery. Write your weight down.  Wear elastic stockings during the day for at least 2 weeks after surgery. Use them longer if your ankles are swollen. The stockings help blood flow and help reduce swelling in the legs.  Take frequent naps or rest often throughout the  day.  Avoid lifting more than 10 lb (4.5 kg) or pushing or pulling things with your arms for 6 8 weeks or as directed by your health care provider.  Avoid driving or airplane travel for 4 6 weeks after surgery or as directed. If you are riding in a car for an extended period, stop every 1 2 hours to stretch your legs.  Avoid crossing your legs.  Avoid climbing stairs and using the handrail to pull yourself up for the first 2 3 weeks after surgery.  Do not take baths for 2 4 weeks after surgery. Take showers once your health care provider approves. Pat incisions dry. Do not rub incisions with a washcloth or towel.  Return to work as directed by your health care provider.  Drink enough fluids to keep your urine clear or pale yellow.  Do not strain to have a bowel movement. Eat high-fiber foods if you become constipated. You may also take a medicine to help you have a bowel movement (laxative) as directed by your health care provider.  Resume sexual activity as directed by your health care provider. SEEK MEDICAL CARE IF:   You develop a skin rash.   Your weight is increasing each day over 2 3 days.  Your weight increases by 2 or more  pounds (1 kg) in a single day. SEEK IMMEDIATE MEDICAL CARE IF:   You develop chest pain that is not coming from your incision.  You develop shortness of breath or difficulty breathing.  You have a fever.  You have increased drainage from your wound.  You see redness, swelling, or have increasing pain in the wound.  You have pus coming from your wound.  You develop lightheadedness. MAKE SURE YOU:  Understand these directions.  Will watch your condition.  Will get help right away if you are not doing well or get worse. Document Released: 02/18/2005 Document Revised: 04/03/2013 Document Reviewed: 01/01/2013 [facility] Patient Information 2014 [place].

## 2013-09-23 NOTE — Progress Notes (Signed)
IV team in room attempting IV; multiple members of IV team have attempted IV; pt encourage to ambulate once IV team places IV; mother in room; pt remains to c/o feeling sleeping; no pain meds administered today; will cont. To monitor.

## 2013-09-23 NOTE — Progress Notes (Signed)
Unable to place PIV by IV team with ultrasound; no IV access at this time; pt due for IV lasix tonight; PA called to make aware; order to leave IV out and given PO 40mg  Lasix tonight instead of IV lasix; pt anticipating d/c home tomorrow; will cont. To monitor.

## 2013-09-23 NOTE — Progress Notes (Signed)
Pt very lethargic; pt states she just feels sleepy; mother at bedside reports pt appeared to have rested well last night; per cardiac rehab, pt almost fell asleep ambulating in hallway; no pain meds given since 1500 yesterday; PA made aware; will check labs in AM; Morphine d/c; if pt c/o pain will administer Ultram versus Oxy; will cont. To monitor; VSS.

## 2013-09-23 NOTE — Progress Notes (Signed)
CARDIAC REHAB PHASE I   PRE:  Rate/Rhythm: 109 ST  BP:  Supine:   Sitting: 126/60  Standing:    SaO2: 100%RA  MODE:  Ambulation: 30 ft   POST:  Rate/Rhythm: 117 ST  BP:  Supine:   Sitting: 115/57  Standing:    SaO2: 98%RA 1015-1050 Pt very sleepy. Encouraged pt to take a walk. Stated needed to go to bathroom. Assisted to bathroom and pt almost falling asleep on toilet. Mother in room encouraging pt also. Attempted to walk pt in hall but she could not keep eyes opened . High fall risk so assisted back to bed. Asked another RN in hall to help me get to bed.  She went to notify pt's RN also. Pt's RN in to assess pt. Pt lying in bed and able to answer questions.   Luetta Nutting, RN BSN  09/23/2013 10:47 AM

## 2013-09-23 NOTE — Progress Notes (Signed)
Pt encouraged to ambulate once more prior to going to bed tonight; pt stated she would; mother also encouraged pt to ambulate; will cont. To monitor.

## 2013-09-23 NOTE — Progress Notes (Addendum)
      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   Objective: Vital signs in last 24 hours: Temp:  [97.5 F (36.4 C)-100.2 F (37.9 C)] 98 F (36.7 C) (02/09 0537) Pulse Rate:  [92-115] 97 (02/09 0537) Cardiac Rhythm:  [-] Normal sinus rhythm;Sinus tachycardia (02/09 0734) Resp:  [18-26] 18 (02/09 0537) BP: (110-139)/(54-71) 139/65 mmHg (02/09 0537) SpO2:  [82 %-100 %] 100 % (02/09 0537) Weight:  [120.6 kg (265 lb 14 oz)] 120.6 kg (265 lb 14 oz) (02/09 0537)  Physical Exam:  Rhythm:   Sinus  Breath sounds: Clear to auscultation  Heart sounds:  RRR, no murmurs, rubs, or gallops  Incisions:  Clean and dry no signs of infection  Abdomen:  Soft and nontender  Extremities:  Bilateral pedal edema, well perfused.    Intake/Output from previous day: 02/08 0701 - 02/09 0700 In: 600 [P.O.:600] Out: 150 [Chest Tube:150] Intake/Output this shift:    Lab Results:  Recent Labs  09/21/13 0424 09/22/13 0405  WBC 15.6* 13.4*  HGB 7.7* 7.5*  HCT 24.9* 24.7*  PLT 184 181   BMET:  Recent Labs  09/21/13 0424 09/22/13 0405  NA 137 137  K 4.3 4.1  CL 103 103  CO2 23 23  GLUCOSE 103* 112*  BUN 13 15  CREATININE 1.04 0.90  CALCIUM 8.0* 8.3*    CBG (last 3)   Recent Labs  09/21/13 0419 09/21/13 0816 09/21/13 1226  GLUCAP 100* 109* 100*   PT/INR:   Recent Labs  09/23/13 0520  LABPROT 13.3  INR 1.03    CXR: Stable post chest tube removal. 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  Pumonary-encouraged IS, 100% on room air. Acute blood loss anemia- 7.5 and 24.7 H & H Continue 40mg  of Lasix daily to diurese-still up 4lbs.  Encouraged ambulation-d/c today or tomorrow.    Jari Favre, PA-S 09/23/2013 8:03 AM  I have seen and examined the patient and agree with the assessment and plan as outlined.  Feeling better although still volume-overloaded.  Will give extra dose lasix IV today.  Tentatively plan d/c home tomorrow.  Raeann Offner H 09/23/2013 8:32 AM

## 2013-09-24 DIAGNOSIS — I369 Nonrheumatic tricuspid valve disorder, unspecified: Secondary | ICD-10-CM

## 2013-09-24 LAB — CBC
HEMATOCRIT: 21 % — AB (ref 36.0–46.0)
HEMATOCRIT: 20.2 % — AB (ref 36.0–46.0)
HEMOGLOBIN: 6.3 g/dL — AB (ref 12.0–15.0)
Hemoglobin: 6.5 g/dL — CL (ref 12.0–15.0)
MCH: 23 pg — ABNORMAL LOW (ref 26.0–34.0)
MCH: 23.1 pg — AB (ref 26.0–34.0)
MCHC: 31 g/dL (ref 30.0–36.0)
MCHC: 31.2 g/dL (ref 30.0–36.0)
MCV: 74.5 fL — ABNORMAL LOW (ref 78.0–100.0)
MCV: 74 fL — AB (ref 78.0–100.0)
Platelets: 343 10*3/uL (ref 150–400)
Platelets: 349 10*3/uL (ref 150–400)
RBC: 2.82 MIL/uL — AB (ref 3.87–5.11)
RBC: 2.73 MIL/uL — ABNORMAL LOW (ref 3.87–5.11)
RDW: 20.4 % — ABNORMAL HIGH (ref 11.5–15.5)
RDW: 20.2 % — ABNORMAL HIGH (ref 11.5–15.5)
WBC: 12.8 10*3/uL — ABNORMAL HIGH (ref 4.0–10.5)
WBC: 13.7 10*3/uL — AB (ref 4.0–10.5)

## 2013-09-24 LAB — BASIC METABOLIC PANEL
BUN: 13 mg/dL (ref 6–23)
CO2: 22 meq/L (ref 19–32)
Calcium: 8.5 mg/dL (ref 8.4–10.5)
Chloride: 104 mEq/L (ref 96–112)
Creatinine, Ser: 0.97 mg/dL (ref 0.50–1.10)
GFR calc non Af Amer: 80 mL/min — ABNORMAL LOW (ref >90)
Glucose, Bld: 92 mg/dL (ref 70–99)
POTASSIUM: 4.1 meq/L (ref 3.7–5.3)
Sodium: 141 mEq/L (ref 137–147)

## 2013-09-24 LAB — PROTIME-INR
INR: 1.15 (ref 0.00–1.49)
PROTHROMBIN TIME: 14.5 s (ref 11.6–15.2)

## 2013-09-24 LAB — PRO B NATRIURETIC PEPTIDE: PRO B NATRI PEPTIDE: 845.8 pg/mL — AB (ref 0–125)

## 2013-09-24 LAB — PREPARE RBC (CROSSMATCH)

## 2013-09-24 LAB — GLUCOSE, CAPILLARY: Glucose-Capillary: 118 mg/dL — ABNORMAL HIGH (ref 70–99)

## 2013-09-24 MED ORDER — FUROSEMIDE 10 MG/ML IJ SOLN
40.0000 mg | Freq: Once | INTRAMUSCULAR | Status: AC
  Administered 2013-09-24: 40 mg via INTRAVENOUS
  Filled 2013-09-24: qty 4

## 2013-09-24 NOTE — Progress Notes (Signed)
Temp 101.5 at this time; PA made aware; pt and mother state pt was just coughing a lot and coughing up thick, clear mucous; pt encouraged to continue to use IS; temp not felt to be reaction to blood at this time; 1st unit PRBC still infusing, should be completed within the next 30-40 minutes; pt c/o some mild pain at incision as a result of coughing; pt given PO Tramadol at this time; will cont. To monitor.

## 2013-09-24 NOTE — Progress Notes (Signed)
Pt sitting up in chair; mother at bedside; pt not wanting to ambulate at this time; pt reports feeling "sleepy"; will cont. To monitor; encouraged pt to ambulate prior to going to bed tonight.

## 2013-09-24 NOTE — Progress Notes (Signed)
TCTS BRIEF PROGRESS NOTE   [name] is awake and alert and neurologically intact.  She reports "feeling very tired and dizzy".  I think her near syncopal event is related to severe symptomatic anemia.  Will transfuse 2 units PRBC's and monitor closely.  Paula Wolfe H 09/24/2013 9:20 AM

## 2013-09-24 NOTE — Progress Notes (Addendum)
Pt talking more now; pt states she does not remember not being able to speak clearly; pt does not remember telling mother she felt as though she was going to pass out; mother at bedside very concerned; VSS; will cont. To monitor.

## 2013-09-24 NOTE — Progress Notes (Addendum)
Nurse reported patient complained of dizziness, became lethargic, and had slurred speech. VSS, placed on 2 liters of oxygen with O2 sat 100%. Glucose checked and was 118. She has not had any narcotics recently or any sleep aids. Her tongue was deviated to the right and she would not squeeze my hands or move her LE. Dr. Cornelius Moras made aware and he will evaluate.

## 2013-09-24 NOTE — Progress Notes (Addendum)
Called to room by pt's mother;motner states pt told her she felt as though she was going to pass out; pt very lethargic; speech slurred and unable to understand anything pt is saying; VSS; O2 2L placed [place]; sats 100%; PA paged to make aware; will cont. To monitor.

## 2013-09-24 NOTE — Progress Notes (Signed)
      [address]       [place] [address]             [phone]        5 Days Post-Op Procedure(s) (LRB): MINIMALLY INVASIVE MITRAL VALVE REPAIR (MVR) OR REPLACEMENT (Right) INTRAOPERATIVE TRANSESOPHAGEAL ECHOCARDIOGRAM (N/A)  Subjective: Patient states is a little run down, but she has her menstrual cycle.  Objective: Vital signs in last 24 hours: Temp:  [98.7 F (37.1 C)-99.9 F (37.7 C)] 99.2 F (37.3 C) (02/10 0500) Pulse Rate:  [111-126] 114 (02/10 0500) Cardiac Rhythm:  [-] Sinus tachycardia (02/09 2030) Resp:  [18] 18 (02/10 0500) BP: (126-142)/(60-82) 142/82 mmHg (02/10 0500) SpO2:  [98 %-100 %] 100 % (02/10 0500) Weight:  [119.2 kg (262 lb 12.6 oz)] 119.2 kg (262 lb 12.6 oz) (02/10 0500)  Pre op weight  118 kg Current Weight  09/24/13 119.2 kg (262 lb 12.6 oz)      Intake/Output from previous day: 02/09 0701 - 02/10 0700 In: 1080 [P.O.:1080] Out: 450 [Urine:450]   Physical Exam:  Cardiovascular: Tachycardic, no murmur Pulmonary: Clear to auscultation bilaterally; no rales, wheezes, or rhonchi. Abdomen: Soft, non tender, bowel sounds present. Extremities: Trace bilateral lower extremity edema. Wounds: Clean and dry.  No erythema or signs of infection.  Lab Results: CBC: Recent Labs  09/22/13 0405 09/24/13 0406  WBC 13.4* 12.8*  HGB 7.5* 6.5*  HCT 24.7* 21.0*  PLT 181 343   BMET:  Recent Labs  09/22/13 0405 09/24/13 0406  NA 137 141  K 4.1 4.1  CL 103 104  CO2 23 22  GLUCOSE 112* 92  BUN 15 13  CREATININE 0.90 0.97  CALCIUM 8.3* 8.5    PT/INR:  Lab Results  Component Value Date   INR 1.15 09/24/2013   INR 1.03 09/23/2013   INR 1.24 09/22/2013   ABG:  INR: Will add last result for INR, ABG once components are confirmed Will add last 4 CBG results once components are confirmed  Assessment/Plan:  1. CV - ST in the 110's. On Lopressor 25 bid, Lisinopril 5 daily, and Coumadin. Monitor HR as may need to increase  BB and SBP as may need to increase Lisinopril. INR decreased from 1.24 to 1.15. 2.  Pulmonary - Encourage incentive spirometer 3. Volume Overload - On Lasix 40 daily 4.  Acute blood loss anemia - H and H decreased to 6.5 and 21.  On folic acid and ferrous sulfate. Will discuss with Dr. Cornelius Moras if needs transfusion  ZIMMERMAN,DONIELLE MPA-C 09/24/2013,7:32 AM

## 2013-09-24 NOTE — Progress Notes (Addendum)
I re examined patient. Her speech is much clearer, she is more alert, and she follows commands. She is asking to go home. Her VS remain stable. Patient does not remember being dizzy or having slurred speech.Dr. Cornelius Moras to evaluate.

## 2013-09-24 NOTE — Progress Notes (Addendum)
Pt ambulated 150 ft on room air with stand by assist, pt tolerated ambulation well. Pt back to chair will continue to monitor.

## 2013-09-24 NOTE — Progress Notes (Signed)
      [address]       [name] [address]             [phone]     CARDIOTHORACIC SURGERY PROGRESS NOTE  5 Days Post-Op  S/P Procedure(s) (LRB): MINIMALLY INVASIVE MITRAL VALVE REPAIR (MVR) OR REPLACEMENT (Right) INTRAOPERATIVE TRANSESOPHAGEAL ECHOCARDIOGRAM (N/A)  Subjective: Mumbling, states she is feeling dizzy and lightheaded  Objective: Vital signs in last 24 hours: Temp:  [98.7 F (37.1 C)-99.9 F (37.7 C)] 99.2 F (37.3 C) (02/10 0500) Pulse Rate:  [111-126] 114 (02/10 0500) Cardiac Rhythm:  [-] Sinus tachycardia (02/10 0737) Resp:  [18] 18 (02/10 0500) BP: (126-142)/(60-82) 142/82 mmHg (02/10 0500) SpO2:  [98 %-100 %] 100 % (02/10 0500) Weight:  [119.2 kg (262 lb 12.6 oz)] 119.2 kg (262 lb 12.6 oz) (02/10 0500)  Physical Exam:  Rhythm:   Sinus tachycardia  Breath sounds: Clear to auscultation  Heart sounds:  Tachycardia, regular rhythm, no murmurs, rubs, or gallops  Incisions:  Clean and dry  Abdomen:  Soft and nontender  Extremities:  Warm and well perfused   Intake/Output from previous day: 02/09 0701 - 02/10 0700 In: 1080 [P.O.:1080] Out: 450 [Urine:450] Intake/Output this shift:    Lab Results:  Recent Labs  09/24/13 0406 09/24/13 0650  WBC 12.8* 13.7*  HGB 6.5* 6.3*  HCT 21.0* 20.2*  PLT 343 349   BMET:  Recent Labs  09/22/13 0405 09/24/13 0406  NA 137 141  K 4.1 4.1  CL 103 104  CO2 23 22  GLUCOSE 112* 92  BUN 15 13  CREATININE 0.90 0.97  CALCIUM 8.3* 8.5    CBG (last 3)   Recent Labs  09/21/13 0816 09/21/13 1226 09/23/13 1045  GLUCAP 109* 100* 115*   PT/INR:   Recent Labs  09/24/13 0406  LABPROT 14.5  INR 1.15    CXR:  N/A  Assessment/Plan: S/P Procedure(s) (LRB): MINIMALLY INVASIVE MITRAL VALVE REPAIR (MVR) OR REPLACEMENT (Right) INTRAOPERATIVE TRANSESOPHAGEAL ECHOCARDIOGRAM (N/A)  1. CV-sinus tachycardia, BP is slightly increasing, but ambulating now 2. Pulmonary- O2 sats have been  good. Encourage IS 3. Iron deficiency anemia-6.3 and 20.2 H & H. Receiving ferrous sulfate 325 mg with breakfast. Reports of her feeling sleepy and closing her eyes while walking. Today feeling dizzy and lightheaded, mumbling. 4. Diuresis-probably ready to decrease dose and discontinue. Her weight is 1lb from baseline 5. A little concerned that her H & H is trending down. May need to think about this before D/C.    Jari Favre. PA-S 09/24/2013 7:43 AM

## 2013-09-24 NOTE — Progress Notes (Signed)
  Echocardiogram 2D Echocardiogram has been performed.  Georgian Co 09/24/2013, 3:50 PM

## 2013-09-24 NOTE — Progress Notes (Signed)
Pt still drowsy, but easily awakens with verbal stimuli; family at bedside; VSS; 1st unit PRBC started at this time; will cont. To monitor.

## 2013-09-24 NOTE — Progress Notes (Signed)
I have seen and examined the patient and agree with the assessment and plan as outlined.  OWEN,CLARENCE H 09/24/2013 2:13 PM

## 2013-09-24 NOTE — Progress Notes (Signed)
      [address]       [name] [address]             [phone]     CARDIOTHORACIC SURGERY PROGRESS NOTE  5 Days Post-Op  S/P Procedure(s) (LRB): MINIMALLY INVASIVE MITRAL VALVE REPAIR (MVR) OR REPLACEMENT (Right) INTRAOPERATIVE TRANSESOPHAGEAL ECHOCARDIOGRAM (N/A)  Subjective: Pt states she is feeling better. Does not have any dizziness or lightheadedness sitting upright in bed. Still seems slightly lethargic.   Objective: Vital signs in last 24 hours: Temp:  [98.6 F (37 C)-101.5 F (38.6 C)] 99.4 F (37.4 C) (02/10 1326) Pulse Rate:  [108-126] 110 (02/10 1326) Cardiac Rhythm:  [-] Sinus tachycardia (02/10 0737) Resp:  [16-18] 16 (02/10 1300) BP: (95-142)/(54-82) 95/54 mmHg (02/10 1300) SpO2:  [98 %-100 %] 100 % (02/10 1326) Weight:  [119.2 kg (262 lb 12.6 oz)] 119.2 kg (262 lb 12.6 oz) (02/10 0500)    Intake/Output from previous day: 02/09 0701 - 02/10 0700 In: 1080 [P.O.:1080] Out: 450 [Urine:450] Intake/Output this shift: Total I/O In: 372.5 [P.O.:360; Blood:12.5] Out: 300 [Urine:300]  Lab Results:  Recent Labs  09/24/13 0406 09/24/13 0650  WBC 12.8* 13.7*  HGB 6.5* 6.3*  HCT 21.0* 20.2*  PLT 343 349   BMET:  Recent Labs  09/22/13 0405 09/24/13 0406  NA 137 141  K 4.1 4.1  CL 103 104  CO2 23 22  GLUCOSE 112* 92  BUN 15 13  CREATININE 0.90 0.97  CALCIUM 8.3* 8.5    CBG (last 3)   Recent Labs  09/23/13 1045 09/24/13 0827  GLUCAP 115* 118*   PT/INR:   Recent Labs  09/24/13 0406  LABPROT 14.5  INR 1.15    CXR:  N/A  Assessment/Plan: S/P Procedure(s) (LRB): MINIMALLY INVASIVE MITRAL VALVE REPAIR (MVR) OR REPLACEMENT (Right) INTRAOPERATIVE TRANSESOPHAGEAL ECHOCARDIOGRAM (N/A)  Mid-transfusion of the first unit of blood. Feeling better-continue to monitor closely.  Watch BP closely. After 5 mg lisinopril and 25 mg of lopressor went from 124/62 to 95/54. May need a med adjustment before d/c Slightly  tachycardic Spiked a fever of 101.5, WBC 12-13 range consistently. Back down to 99.4    Jari Favre, PA-S 09/24/2013 1:49 PM

## 2013-09-24 NOTE — Progress Notes (Signed)
Temp now 99.4; pt using IS at this time; pt encouraged to continue use of IS; mother also encouraging pt to use IS; VSS; will administer IV lasix after 1st unit PRBC completely infused; will cont. To monitor.

## 2013-09-24 NOTE — Progress Notes (Signed)
Dr. Cornelius Moras to see pt soon; pt becoming more alert, now able to understand speech; moving all extremities; will cont. To monitor.

## 2013-09-24 NOTE — Progress Notes (Signed)
Spoke with Dr. Cornelius Moras in regards to Lasix order; IV Lasix and PO lasix; per Dr. Cornelius Moras just given 40mg  IV lasix in between units of blood; will cont. To monitor.

## 2013-09-24 NOTE — Progress Notes (Signed)
CRITICAL VALUE ALERT  Critical value received:  Hgb 6.5  Date of notification:  09/24/13  Time of notification:  0545  Critical value read back: yes  Nurse who received alert:  KDS  Order in chart says DO NOT notify MD unless Hgb less than 6.

## 2013-09-24 NOTE — Progress Notes (Signed)
PA in room; blood sugar checked; BS 118 at this time; PA to notify MD; will cont. To monitor.

## 2013-09-24 NOTE — Progress Notes (Signed)
I have seen and examined the patient and agree with the assessment and plan as outlined.  She has symptomatic anemia with dizzy spells and near syncopal event.  Hgb continues to drift down, likely due to ongoing menstruation.  Will transfuse 2 units PRBCs.  Swayzie Choate H 09/24/2013 9:16 AM

## 2013-09-25 ENCOUNTER — Encounter (HOSPITAL_COMMUNITY): Payer: Self-pay | Admitting: Thoracic Surgery (Cardiothoracic Vascular Surgery)

## 2013-09-25 DIAGNOSIS — I428 Other cardiomyopathies: Secondary | ICD-10-CM | POA: Diagnosis present

## 2013-09-25 LAB — TYPE AND SCREEN
ABO/RH(D): AB POS
Antibody Screen: NEGATIVE
Unit division: 0
Unit division: 0

## 2013-09-25 LAB — CBC
HCT: 24.9 % — ABNORMAL LOW (ref 36.0–46.0)
HEMOGLOBIN: 8.1 g/dL — AB (ref 12.0–15.0)
MCH: 25.2 pg — ABNORMAL LOW (ref 26.0–34.0)
MCHC: 32.5 g/dL (ref 30.0–36.0)
MCV: 77.3 fL — ABNORMAL LOW (ref 78.0–100.0)
Platelets: 311 10*3/uL (ref 150–400)
RBC: 3.22 MIL/uL — ABNORMAL LOW (ref 3.87–5.11)
RDW: 19.4 % — ABNORMAL HIGH (ref 11.5–15.5)
WBC: 12.4 10*3/uL — AB (ref 4.0–10.5)

## 2013-09-25 LAB — BASIC METABOLIC PANEL
BUN: 13 mg/dL (ref 6–23)
CALCIUM: 8.6 mg/dL (ref 8.4–10.5)
CO2: 24 meq/L (ref 19–32)
Chloride: 104 mEq/L (ref 96–112)
Creatinine, Ser: 1.01 mg/dL (ref 0.50–1.10)
GFR calc Af Amer: 88 mL/min — ABNORMAL LOW (ref >90)
GFR, EST NON AFRICAN AMERICAN: 76 mL/min — AB (ref >90)
GLUCOSE: 93 mg/dL (ref 70–99)
Potassium: 4.1 mEq/L (ref 3.7–5.3)
Sodium: 142 mEq/L (ref 137–147)

## 2013-09-25 LAB — PROTIME-INR
INR: 1.4 (ref 0.00–1.49)
PROTHROMBIN TIME: 16.8 s — AB (ref 11.6–15.2)

## 2013-09-25 MED ORDER — OXYCODONE HCL 5 MG PO TABS: 5.0000 mg | ORAL_TABLET | ORAL | Status: DC | PRN

## 2013-09-25 MED ORDER — WARFARIN SODIUM 5 MG PO TABS: 5.0000 mg | ORAL_TABLET | Freq: Every day | ORAL | Status: DC

## 2013-09-25 MED ORDER — METOPROLOL TARTRATE 25 MG PO TABS
37.5000 mg | ORAL_TABLET | Freq: Two times a day (BID) | ORAL | Status: DC
  Administered 2013-09-25: 37.5 mg via ORAL
  Filled 2013-09-25 (×2): qty 1

## 2013-09-25 MED ORDER — ASPIRIN 81 MG PO TBEC: 81.0000 mg | DELAYED_RELEASE_TABLET | Freq: Every day | ORAL | Status: DC

## 2013-09-25 MED ORDER — METOPROLOL TARTRATE 50 MG PO TABS: 50.0000 mg | ORAL_TABLET | Freq: Two times a day (BID) | ORAL | Status: DC

## 2013-09-25 MED ORDER — METOPROLOL TARTRATE 50 MG PO TABS
50.0000 mg | ORAL_TABLET | Freq: Two times a day (BID) | ORAL | Status: DC
  Filled 2013-09-25: qty 1

## 2013-09-25 MED ORDER — FOLIC ACID 1 MG PO TABS: 1.0000 mg | ORAL_TABLET | Freq: Every day | ORAL | Status: DC

## 2013-09-25 MED ORDER — LISINOPRIL 5 MG PO TABS: 5.0000 mg | ORAL_TABLET | Freq: Every day | ORAL | Status: DC

## 2013-09-25 MED ORDER — FUROSEMIDE 40 MG PO TABS: 40.0000 mg | ORAL_TABLET | Freq: Every day | ORAL | Status: DC

## 2013-09-25 NOTE — Progress Notes (Signed)
Pt's temp 100.1. Pt educated on using incentive spirometer. Pt currently using incentive spirometer. Will recheck and continue to monitor.

## 2013-09-25 NOTE — Progress Notes (Signed)
Pt ambulated 150 ft with standby assistance on RA. Pt had some dyspnea with activity but oxygen saturations WDL. Pt stated she is lives in an apartment complex on the second floor and would like to practice on some steps before going home.

## 2013-09-25 NOTE — Progress Notes (Signed)
CARDIAC REHAB PHASE I   PRE:  Rate/Rhythm: 118 ST  BP:  Supine:   Sitting: 137/75  Standing:    SaO2: 97%RA  MODE:  Ambulation: 350 ft   POST:  Rate/Rhythm: 138ST  BP:  Supine:   Sitting: 144/65  Standing:    SaO2: 97-98%RA 0925-1010 Pt walked 350 ft with slow steady pace independently. Tolerated increase in distance well. Denied SOB or palpitations but did state pain when taking deep breath. Heart rate to 138 during walk. Education completed with pt and mother. Discussed watching sodium. Discussed CRP 2 but pt declined. Feels she will be youngest one there. Encouraged them to watch post op video before discharge.   Paula Nutting, RN BSN  09/25/2013 10:05 AM

## 2013-09-25 NOTE — Progress Notes (Addendum)
      [address]       [place] [address]             [phone]        6 Days Post-Op Procedure(s) (LRB): MINIMALLY INVASIVE MITRAL VALVE REPAIR (MVR) OR REPLACEMENT (Right) INTRAOPERATIVE TRANSESOPHAGEAL ECHOCARDIOGRAM (N/A)  Subjective: Patient feels much better this morning. She has had no further dizziness, garbled speech. She would like to go home.  Objective: Vital signs in last 24 hours: Temp:  [97.2 F (36.2 C)-101.5 F (38.6 C)] 98.8 F (37.1 C) (02/11 0550) Pulse Rate:  [108-116] 112 (02/11 0519) Cardiac Rhythm:  [-] Sinus tachycardia (02/11 0810) Resp:  [16] 16 (02/10 2109) BP: (95-135)/(45-96) 122/80 mmHg (02/11 0519) SpO2:  [97 %-100 %] 98 % (02/11 0519) Weight:  [118 kg (260 lb 2.3 oz)] 118 kg (260 lb 2.3 oz) (02/11 0400)  Pre op weight  118 kg Current Weight  09/25/13 118 kg (260 lb 2.3 oz)      Intake/Output from previous day: 02/10 0701 - 02/11 0700 In: 1275 [P.O.:600; I.V.:325; Blood:350] Out: 1900 [Urine:1900]   Physical Exam:  Cardiovascular: Tachycardic, with exhalation sounds as though there is a slight rub Pulmonary: Clear to auscultation bilaterally; no rales, wheezes, or rhonchi. Abdomen: Soft, non tender, bowel sounds present. Extremities: Trace bilateral lower extremity edema. Wounds: Clean and dry.  No erythema or signs of infection. Neurologic: Intact without focal deficits  Lab Results: CBC:  Recent Labs  09/24/13 0650 09/25/13 0320  WBC 13.7* 12.4*  HGB 6.3* 8.1*  HCT 20.2* 24.9*  PLT 349 311   BMET:   Recent Labs  09/24/13 0406 09/25/13 0320  NA 141 142  K 4.1 4.1  CL 104 104  CO2 22 24  GLUCOSE 92 93  BUN 13 13  CREATININE 0.97 1.01  CALCIUM 8.5 8.6    PT/INR:  Lab Results  Component Value Date   INR 1.40 09/25/2013   INR 1.15 09/24/2013   INR 1.03 09/23/2013   ABG:  INR: Will add last result for INR, ABG once components are confirmed Will add last 4 CBG results once components  are confirmed  Assessment/Plan:  1. CV - ST in the 120's. On Lopressor 25 bid, Lisinopril 5 daily, and Coumadin. Will increase Lopressor to 37.5 bid. Monitor BP as may need to increase Lisinopril as well.INR increased from 1.15 to 1.4 so will continue with Coumadin 5. Echo done yesterday and results noted (no abnormalities with valve repair and no pericardial effusion) 2.  Pulmonary - Encourage incentive spirometer 3. Volume Overload - On Lasix 40 daily 4.  Acute blood loss anemia - H and H increased to 8.1 and 24.9 after blood transfusion.On folic acid and ferrous sulfate.  5. Had fever to 101.5 yesterday. WBC down to 12,400.Was being given blood and could also be due to atelectasis. No wound infection and no GU complaints. 6. Will discuss discharge disposition with surgeon-possibly later today  ZIMMERMAN,DONIELLE MPA-C 09/25/2013,8:37 AM  I have seen and examined the patient and agree with the assessment and plan as outlined.  Will increase metoprolol 50 mg bid for now then increase lisinopril if BP will tolerate.  Follow up ECHO looks good although poor acoustic windows.  Will need relatively early f/u with cardiology due to non-ischemic cardiomyopathy.  Okay for d/c home today.   OWEN,CLARENCE H 09/25/2013 10:33 AM

## 2013-09-25 NOTE — Progress Notes (Signed)
Pt offered to ambulate. Pt declined, states she feels very tired. Pt stated she will walk in the morning before day shift arrives.

## 2013-09-25 NOTE — Progress Notes (Signed)
IV and tele discontinued, discharge education and medication given and reviewed with patient, mother, and significant other. All parties stated understanding.  Archie Balboa, RN

## 2013-09-26 ENCOUNTER — Emergency Department (HOSPITAL_COMMUNITY): Payer: BC Managed Care – PPO

## 2013-09-26 ENCOUNTER — Inpatient Hospital Stay (HOSPITAL_COMMUNITY)
Admission: EM | Admit: 2013-09-26 | Discharge: 2013-10-01 | DRG: 194 | Disposition: A | Discharge status: Home / Self Care | Payer: BC Managed Care – PPO | Attending: Thoracic Surgery (Cardiothoracic Vascular Surgery) | Admitting: Thoracic Surgery (Cardiothoracic Vascular Surgery)

## 2013-09-26 ENCOUNTER — Encounter (HOSPITAL_COMMUNITY): Payer: Self-pay | Admitting: Emergency Medicine

## 2013-09-26 DIAGNOSIS — I2789 Other specified pulmonary heart diseases: Secondary | ICD-10-CM | POA: Diagnosis present

## 2013-09-26 DIAGNOSIS — Z7982 Long term (current) use of aspirin: Secondary | ICD-10-CM

## 2013-09-26 DIAGNOSIS — D509 Iron deficiency anemia, unspecified: Secondary | ICD-10-CM | POA: Diagnosis present

## 2013-09-26 DIAGNOSIS — Z79899 Other long term (current) drug therapy: Secondary | ICD-10-CM

## 2013-09-26 DIAGNOSIS — R079 Chest pain, unspecified: Secondary | ICD-10-CM

## 2013-09-26 DIAGNOSIS — R0602 Shortness of breath: Secondary | ICD-10-CM | POA: Diagnosis present

## 2013-09-26 DIAGNOSIS — J90 Pleural effusion, not elsewhere classified: Secondary | ICD-10-CM | POA: Diagnosis present

## 2013-09-26 DIAGNOSIS — E039 Hypothyroidism, unspecified: Secondary | ICD-10-CM | POA: Diagnosis present

## 2013-09-26 DIAGNOSIS — I509 Heart failure, unspecified: Secondary | ICD-10-CM | POA: Diagnosis present

## 2013-09-26 DIAGNOSIS — I5042 Chronic combined systolic (congestive) and diastolic (congestive) heart failure: Secondary | ICD-10-CM | POA: Diagnosis present

## 2013-09-26 DIAGNOSIS — D62 Acute posthemorrhagic anemia: Secondary | ICD-10-CM | POA: Diagnosis present

## 2013-09-26 DIAGNOSIS — R55 Syncope and collapse: Secondary | ICD-10-CM | POA: Diagnosis present

## 2013-09-26 DIAGNOSIS — Z8249 Family history of ischemic heart disease and other diseases of the circulatory system: Secondary | ICD-10-CM

## 2013-09-26 DIAGNOSIS — I428 Other cardiomyopathies: Secondary | ICD-10-CM | POA: Diagnosis present

## 2013-09-26 DIAGNOSIS — N920 Excessive and frequent menstruation with regular cycle: Secondary | ICD-10-CM | POA: Diagnosis present

## 2013-09-26 DIAGNOSIS — Z6841 Body Mass Index (BMI) 40.0 and over, adult: Secondary | ICD-10-CM

## 2013-09-26 DIAGNOSIS — J9819 Other pulmonary collapse: Secondary | ICD-10-CM | POA: Diagnosis present

## 2013-09-26 DIAGNOSIS — N390 Urinary tract infection, site not specified: Secondary | ICD-10-CM

## 2013-09-26 DIAGNOSIS — R000 Tachycardia, unspecified: Secondary | ICD-10-CM | POA: Diagnosis not present

## 2013-09-26 DIAGNOSIS — J189 Pneumonia, unspecified organism: Secondary | ICD-10-CM | POA: Diagnosis not present

## 2013-09-26 DIAGNOSIS — E869 Volume depletion, unspecified: Secondary | ICD-10-CM | POA: Diagnosis present

## 2013-09-26 DIAGNOSIS — Z9889 Other specified postprocedural states: Secondary | ICD-10-CM

## 2013-09-26 DIAGNOSIS — J9600 Acute respiratory failure, unspecified whether with hypoxia or hypercapnia: Secondary | ICD-10-CM

## 2013-09-26 DIAGNOSIS — Z7901 Long term (current) use of anticoagulants: Secondary | ICD-10-CM

## 2013-09-26 NOTE — ED Provider Notes (Signed)
CSN: [id number]     Arrival date & time 09/26/13  2218 History   First MD Initiated Contact with Patient 09/26/13 2222     Chief Complaint  Patient presents with  . Chest Pain  . Fever      HPI  28 year old morbidly obese African American female with hx of severe symptomatic mitral regurgitation s/p sorin memo 3D Ring annuloplasty on 09/19/13. Patient was discharged yesterday in stable condition but had some fevers on the 10th. She now presents with worsening cough, Chest pain, and fever. Today she had a near syncopal episode while at home. She was standing up and felt lightheaded and had near LOC. Family was there to prevent her from falling. During the episode she was very weak but could hear family around her. No seizure like activity. CP is similar to what she has had since hospitalization. No worsening or alleviating factors. Denies redness or drainage form the wound.       Past Medical History  Diagnosis Date  . Hypothyroidism   . Nontoxic multinodular goiter     a. Pt reports prior ultrasound/bx that were negative but was told she would need to keep an eye on them in the future. Benign FNA per [facility] for this dx.  . Menometrorrhagia     a. Improved after tx of hypothyroidism, but recurred 05/2013.  . Broken foot     a. Remote broken foot with residual chronic LLE edema.  . Acute on chronic combined systolic and diastolic heart failure 09/12/2013  . Mitral regurgitation 09/12/2013    severe by TEE  . Multinodular goiter   . Pulmonary hypertension 09/13/2013  . Obesity   . Anemia     iron deficient, attributed to menometrorrhagia  . Chronic combined systolic and diastolic CHF (congestive heart failure)   . Morbid obesity with BMI of 45.0-49.9, adult 09/16/2013  . H/O syncope     prior to finding out about the mitral regurgitation  . Shortness of breath     sometimes with exertion  . Headache(784.0)     headaches  . 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 Non-ischemic cardiomyopathy    Past Surgical History  Procedure Laterality Date  . External ear surgery    . Mitral valve repair Right 09/19/2013    Procedure: MINIMALLY INVASIVE MITRAL VALVE REPAIR (MVR) OR REPLACEMENT;  Surgeon: Purcell Nails, MD;  Location: [facility] OR;  Service: Open Heart Surgery;  Laterality: Right;  . Intraoperative transesophageal echocardiogram N/A 09/19/2013    Procedure: INTRAOPERATIVE TRANSESOPHAGEAL ECHOCARDIOGRAM;  Surgeon: Purcell Nails, MD;  Location: [facility] OR;  Service: Open Heart Surgery;  Laterality: N/A;   Family History  Problem Relation Age of Onset  . Hypertension Mother   . Hypertension Sister   . Hypertension Father   . Heart Problems Maternal Grandfather     details unclear   History  Substance Use Topics  . Smoking status: Never Smoker   . Smokeless tobacco: Never Used  . Alcohol Use: Yes     Comment: occasionally - 1-2x/month   OB History   Grav Para Term Preterm Abortions TAB SAB Ect Mult Living                 Review of Systems  Constitutional: Positive for fever and chills. Negative for activity change and appetite change.  HENT: Negative for congestion, ear pain and  rhinorrhea.   Eyes: Negative for pain.  Respiratory: Positive for cough. Negative for shortness of breath.   Cardiovascular: Positive for chest pain. Negative for palpitations.  Gastrointestinal: Negative for nausea, vomiting and abdominal pain.  Genitourinary: Negative for dysuria, difficulty urinating and pelvic pain.  Musculoskeletal: Negative for back pain and neck pain.  Skin: Negative for rash and wound.  Neurological: Positive for syncope ( near syncope) and light-headedness. Negative for weakness and headaches.  Psychiatric/Behavioral: Negative for behavioral problems, confusion and agitation.      Allergies  Review of patient's allergies indicates no known  allergies.  Home Medications   Current Outpatient Rx  Name  Route  Sig  Dispense  Refill  . aspirin EC 81 MG EC tablet   Oral   Take 1 tablet (81 mg total) by mouth daily.         . Ferrous Sulfate (FER-IRON PO)   Oral   Take 80 mg by mouth 2 (two) times daily.         . folic acid (FOLVITE) 1 MG tablet   Oral   Take 1 tablet (1 mg total) by mouth daily.   30 tablet   1   . furosemide (LASIX) 40 MG tablet   Oral   Take 1 tablet (40 mg total) by mouth daily.   30 tablet   1   . levothyroxine (SYNTHROID, LEVOTHROID) 75 MCG tablet   Oral   Take 75 mcg by mouth daily before breakfast.         . lisinopril (PRINIVIL,ZESTRIL) 5 MG tablet   Oral   Take 1 tablet (5 mg total) by mouth daily.   30 tablet   1   . metoprolol (LOPRESSOR) 50 MG tablet   Oral   Take 1 tablet (50 mg total) by mouth 2 (two) times daily.   60 tablet   1   . oxyCODONE (OXY IR/ROXICODONE) 5 MG immediate release tablet   Oral   Take 1-2 tablets (5-10 mg total) by mouth every 4 (four) hours as needed for moderate pain.   50 tablet   0   . warfarin (COUMADIN) 5 MG tablet   Oral   Take 1 tablet (5 mg total) by mouth daily at 6 PM.   100 tablet   1    BP 126/57  Pulse 103  Temp(Src) 98.8 F (37.1 C) (Oral)  Resp 20  SpO2 100%  LMP 08/16/2013 Physical Exam  Constitutional: She is oriented to person, place, and time. She appears well-developed and well-nourished. No distress.  HENT:  Head: Normocephalic and atraumatic.  Nose: Nose normal.  Mouth/Throat: Oropharynx is clear and moist.  Eyes: EOM are normal. Pupils are equal, round, and reactive to light.  Neck: Normal range of motion. Neck supple. No tracheal deviation present.  Cardiovascular: Normal rate, regular rhythm, normal heart sounds and intact distal pulses.   Pulmonary/Chest: Effort normal and breath sounds normal. She has no rales.  Abdominal: Soft. Bowel sounds are normal. She exhibits no distension. There is no  tenderness. There is no rebound and no guarding.  Musculoskeletal: Normal range of motion. She exhibits no tenderness.  Neurological: She is alert and oriented to person, place, and time.  Skin: Skin is warm and dry. No rash noted.  Wound is well approximated , no warmth or ertythema  Psychiatric: She has a normal mood and affect. Her behavior is normal.    ED Course  Procedures (including critical care time) Labs Review Labs Reviewed  CULTURE, BLOOD (ROUTINE X 2)  CULTURE, BLOOD (ROUTINE X 2)  CULTURE, BLOOD (SINGLE)  CBC WITH DIFFERENTIAL  BASIC METABOLIC PANEL  URINALYSIS, ROUTINE W REFLEX MICROSCOPIC   Results for orders placed during the hospital encounter of 09/26/13  CBC WITH DIFFERENTIAL      Result Value Ref Range   WBC 11.0 (*) 4.0 - 10.5 K/uL   RBC 3.97  3.87 - 5.11 MIL/uL   Hemoglobin 9.9 (*) 12.0 - 15.0 g/dL   HCT 16.8 (*) 37.2 - 90.2 %   MCV 77.1 (*) 78.0 - 100.0 fL   MCH 24.9 (*) 26.0 - 34.0 pg   MCHC 32.4  30.0 - 36.0 g/dL   RDW 11.1 (*) 55.2 - 08.0 %   Platelets 424 (*) 150 - 400 K/uL   Neutrophils Relative % 68  43 - 77 %   Neutro Abs 7.4  1.7 - 7.7 K/uL   Lymphocytes Relative 19  12 - 46 %   Lymphs Abs 2.1  0.7 - 4.0 K/uL   Monocytes Relative 13 (*) 3 - 12 %   Monocytes Absolute 1.4 (*) 0.1 - 1.0 K/uL   Eosinophils Relative 1  0 - 5 %   Eosinophils Absolute 0.1  0.0 - 0.7 K/uL   Basophils Relative 0  0 - 1 %   Basophils Absolute 0.0  0.0 - 0.1 K/uL  BASIC METABOLIC PANEL      Result Value Ref Range   Sodium 138  137 - 147 mEq/L   Potassium 3.5 (*) 3.7 - 5.3 mEq/L   Chloride 99  96 - 112 mEq/L   CO2 22  19 - 32 mEq/L   Glucose, Bld 110 (*) 70 - 99 mg/dL   BUN 13  6 - 23 mg/dL   Creatinine, Ser 2.23  0.50 - 1.10 mg/dL   Calcium 8.6  8.4 - 36.1 mg/dL   GFR calc non Af Amer 82 (*) >90 mL/min   GFR calc Af Amer >90  >90 mL/min  URINALYSIS, ROUTINE W REFLEX MICROSCOPIC      Result Value Ref Range   Color, Urine AMBER (*) YELLOW   APPearance CLOUDY  (*) CLEAR   Specific Gravity, Urine 1.029  1.005 - 1.030   pH 6.0  5.0 - 8.0   Glucose, UA NEGATIVE  NEGATIVE mg/dL   Hgb urine dipstick NEGATIVE  NEGATIVE   Bilirubin Urine SMALL (*) NEGATIVE   Ketones, ur 15 (*) NEGATIVE mg/dL   Protein, ur 224 (*) NEGATIVE mg/dL   Urobilinogen, UA >4.9 (*) 0.0 - 1.0 mg/dL   Nitrite POSITIVE (*) NEGATIVE   Leukocytes, UA NEGATIVE  NEGATIVE  URINE MICROSCOPIC-ADD ON      Result Value Ref Range   Squamous Epithelial / LPF RARE  RARE   WBC, UA 3-6  <3 WBC/hpf   RBC / HPF 0-2  <3 RBC/hpf   Bacteria, UA RARE  RARE   Urine-Other MUCOUS PRESENT    PROTIME-INR      Result Value Ref Range   Prothrombin Time 15.6 (*) 11.6 - 15.2 seconds   INR 1.27  0.00 - 1.49  POCT I-STAT, CHEM 8      Result Value Ref Range   Sodium 139  137 - 147 mEq/L   Potassium 3.3 (*) 3.7 - 5.3 mEq/L   Chloride 102  96 - 112 mEq/L   BUN 11  6 - 23 mg/dL   Creatinine, Ser 7.53  0.50 - 1.10 mg/dL   Glucose, Bld 005 (*)  70 - 99 mg/dL   Calcium, Ion 1.61 (*) 1.12 - 1.23 mmol/L   TCO2 24  0 - 100 mmol/L   Hemoglobin 10.9 (*) 12.0 - 15.0 g/dL   HCT 09.6 (*) 04.5 - 40.9 %  CG4 I-STAT (LACTIC ACID)      Result Value Ref Range   Lactic Acid, Venous 0.91  0.5 - 2.2 mmol/L    . Imaging Review Dg Chest 2 View  09/27/2013   CLINICAL DATA:  Chest pain.  Fever.  Weakness.  EXAM: CHEST  2 VIEW  COMPARISON:  DG CHEST 2 VIEW dated 09/23/2013; DG CHEST 1V PORT dated 09/22/2013; DG CHEST 2 VIEW dated 09/22/2013; DG CHEST 1V PORT dated 09/20/2013; DG CHEST 1V PORT dated 09/21/2013  FINDINGS: Stable configuration of the chest compared to prior exam. No definite superimposed airspace disease. Scarring and postsurgical changes in the right chest appears similar. Small right pleural effusion. Trachea remains midline. The cardiopericardial silhouette appears similar.  IMPRESSION: Short-term stability of the chest with postsurgical changes in the right hemithorax and scarring. No definite acute superimposed  cardiopulmonary disease.   Electronically Signed   By: Andreas Newport M.D.   On: 09/27/2013 00:18    EKG Interpretation   None       MDM   Final diagnoses:  Near syncope  SOB (shortness of breath)  Chest pain    28 yo F presents for near syncopal episode, fever, and productive cough. CXR today similar to previous no new appreciable infiltrate. Patient reports fevers of up to 102 at home but is afebrile here. She is tachycardic. Bedside US performed and interpreted by me. No pericardial effusion noted on PSS, PSL, and apical views. Doubt cardiac tamponade. INR 1.27. Patient remained HDS throughout ED course and case was discussed with Dr. Tyrone Sage (CT surgery) planned on admitting patient to telemetry but prior to transfer patient had sudden onset SOB.  Patient became tachypnea with RR of 24. Repeat portable CXR without PTX or flash pulm edema. Patient maintained sats in the mid 90's on RA. Blood cultures x 3 sent prior to abx administration. Patient received vanc/zosyn. Case discussed with my attending Dr. Rubin Payor. Patient wa admitted to SICU under the care of Dr. Cornelius Moras. Family and patient up dated multiple times throughout ED course.     Nadara Mustard, MD 09/27/13 [phone]

## 2013-09-26 NOTE — ED Notes (Signed)
Patient is s/p surgery after having surgery for leaky heart valve, having intermittent chest pain at incision site.  Patient also having fever off and on.  Incision site looks well approximated and healing well.  No signs of infection at site.  Patient has had recent blood transfusion for anemia.

## 2013-09-27 ENCOUNTER — Inpatient Hospital Stay (HOSPITAL_COMMUNITY): Payer: BC Managed Care – PPO

## 2013-09-27 DIAGNOSIS — E869 Volume depletion, unspecified: Secondary | ICD-10-CM | POA: Diagnosis present

## 2013-09-27 DIAGNOSIS — N920 Excessive and frequent menstruation with regular cycle: Secondary | ICD-10-CM | POA: Diagnosis present

## 2013-09-27 DIAGNOSIS — Z8249 Family history of ischemic heart disease and other diseases of the circulatory system: Secondary | ICD-10-CM | POA: Diagnosis not present

## 2013-09-27 DIAGNOSIS — J9600 Acute respiratory failure, unspecified whether with hypoxia or hypercapnia: Secondary | ICD-10-CM

## 2013-09-27 DIAGNOSIS — N390 Urinary tract infection, site not specified: Secondary | ICD-10-CM | POA: Diagnosis present

## 2013-09-27 DIAGNOSIS — J90 Pleural effusion, not elsewhere classified: Secondary | ICD-10-CM | POA: Diagnosis present

## 2013-09-27 DIAGNOSIS — Z7901 Long term (current) use of anticoagulants: Secondary | ICD-10-CM | POA: Diagnosis not present

## 2013-09-27 DIAGNOSIS — R55 Syncope and collapse: Secondary | ICD-10-CM | POA: Diagnosis present

## 2013-09-27 DIAGNOSIS — I509 Heart failure, unspecified: Secondary | ICD-10-CM | POA: Diagnosis present

## 2013-09-27 DIAGNOSIS — Z7982 Long term (current) use of aspirin: Secondary | ICD-10-CM | POA: Diagnosis not present

## 2013-09-27 DIAGNOSIS — D509 Iron deficiency anemia, unspecified: Secondary | ICD-10-CM | POA: Diagnosis present

## 2013-09-27 DIAGNOSIS — J9819 Other pulmonary collapse: Secondary | ICD-10-CM | POA: Diagnosis present

## 2013-09-27 DIAGNOSIS — E039 Hypothyroidism, unspecified: Secondary | ICD-10-CM | POA: Diagnosis present

## 2013-09-27 DIAGNOSIS — Z6841 Body Mass Index (BMI) 40.0 and over, adult: Secondary | ICD-10-CM | POA: Diagnosis not present

## 2013-09-27 DIAGNOSIS — I5042 Chronic combined systolic (congestive) and diastolic (congestive) heart failure: Secondary | ICD-10-CM | POA: Diagnosis present

## 2013-09-27 DIAGNOSIS — D62 Acute posthemorrhagic anemia: Secondary | ICD-10-CM | POA: Diagnosis present

## 2013-09-27 DIAGNOSIS — I517 Cardiomegaly: Secondary | ICD-10-CM

## 2013-09-27 DIAGNOSIS — J189 Pneumonia, unspecified organism: Secondary | ICD-10-CM | POA: Diagnosis present

## 2013-09-27 DIAGNOSIS — I2789 Other specified pulmonary heart diseases: Secondary | ICD-10-CM | POA: Diagnosis present

## 2013-09-27 DIAGNOSIS — Z79899 Other long term (current) drug therapy: Secondary | ICD-10-CM | POA: Diagnosis not present

## 2013-09-27 DIAGNOSIS — I428 Other cardiomyopathies: Secondary | ICD-10-CM | POA: Diagnosis present

## 2013-09-27 DIAGNOSIS — R000 Tachycardia, unspecified: Secondary | ICD-10-CM | POA: Diagnosis not present

## 2013-09-27 LAB — URINALYSIS, ROUTINE W REFLEX MICROSCOPIC
Glucose, UA: NEGATIVE mg/dL
HGB URINE DIPSTICK: NEGATIVE
Ketones, ur: 15 mg/dL — AB
Leukocytes, UA: NEGATIVE
Nitrite: POSITIVE — AB
Protein, ur: 100 mg/dL — AB
SPECIFIC GRAVITY, URINE: 1.029 (ref 1.005–1.030)
pH: 6 (ref 5.0–8.0)

## 2013-09-27 LAB — POCT I-STAT, CHEM 8
BUN: 11 mg/dL (ref 6–23)
CALCIUM ION: 1.1 mmol/L — AB (ref 1.12–1.23)
Chloride: 102 mEq/L (ref 96–112)
Creatinine, Ser: 0.9 mg/dL (ref 0.50–1.10)
Glucose, Bld: 116 mg/dL — ABNORMAL HIGH (ref 70–99)
HCT: 32 % — ABNORMAL LOW (ref 36.0–46.0)
Hemoglobin: 10.9 g/dL — ABNORMAL LOW (ref 12.0–15.0)
Potassium: 3.3 mEq/L — ABNORMAL LOW (ref 3.7–5.3)
Sodium: 139 mEq/L (ref 137–147)
TCO2: 24 mmol/L (ref 0–100)

## 2013-09-27 LAB — BASIC METABOLIC PANEL
BUN: 13 mg/dL (ref 6–23)
CO2: 22 meq/L (ref 19–32)
Calcium: 8.6 mg/dL (ref 8.4–10.5)
Chloride: 99 mEq/L (ref 96–112)
Creatinine, Ser: 0.95 mg/dL (ref 0.50–1.10)
GFR calc Af Amer: >90 mL/min (ref >90)
GFR, EST NON AFRICAN AMERICAN: 82 mL/min — AB (ref >90)
GLUCOSE: 110 mg/dL — AB (ref 70–99)
POTASSIUM: 3.5 meq/L — AB (ref 3.7–5.3)
SODIUM: 138 meq/L (ref 137–147)

## 2013-09-27 LAB — CBC WITH DIFFERENTIAL/PLATELET
Basophils Absolute: 0 10*3/uL (ref 0.0–0.1)
Basophils Relative: 0 % (ref 0–1)
EOS ABS: 0.1 10*3/uL (ref 0.0–0.7)
Eosinophils Relative: 1 % (ref 0–5)
HCT: 30.6 % — ABNORMAL LOW (ref 36.0–46.0)
HEMOGLOBIN: 9.9 g/dL — AB (ref 12.0–15.0)
LYMPHS PCT: 19 % (ref 12–46)
Lymphs Abs: 2.1 10*3/uL (ref 0.7–4.0)
MCH: 24.9 pg — AB (ref 26.0–34.0)
MCHC: 32.4 g/dL (ref 30.0–36.0)
MCV: 77.1 fL — AB (ref 78.0–100.0)
MONOS PCT: 13 % — AB (ref 3–12)
Monocytes Absolute: 1.4 10*3/uL — ABNORMAL HIGH (ref 0.1–1.0)
NEUTROS PCT: 68 % (ref 43–77)
Neutro Abs: 7.4 10*3/uL (ref 1.7–7.7)
Platelets: 424 10*3/uL — ABNORMAL HIGH (ref 150–400)
RBC: 3.97 MIL/uL (ref 3.87–5.11)
RDW: 20.7 % — ABNORMAL HIGH (ref 11.5–15.5)
WBC: 11 10*3/uL — AB (ref 4.0–10.5)

## 2013-09-27 LAB — URINE MICROSCOPIC-ADD ON

## 2013-09-27 LAB — PROTIME-INR
INR: 1.27 (ref 0.00–1.49)
Prothrombin Time: 15.6 seconds — ABNORMAL HIGH (ref 11.6–15.2)

## 2013-09-27 LAB — CG4 I-STAT (LACTIC ACID): Lactic Acid, Venous: 0.91 mmol/L (ref 0.5–2.2)

## 2013-09-27 MED ORDER — ASPIRIN EC 81 MG PO TBEC: 81.0000 mg | DELAYED_RELEASE_TABLET | Freq: Every day | ORAL | Status: DC

## 2013-09-27 MED ORDER — PANTOPRAZOLE SODIUM 40 MG PO TBEC
40.0000 mg | DELAYED_RELEASE_TABLET | Freq: Every day | ORAL | Status: DC
  Administered 2013-09-27 – 2013-10-01 (×5): 40 mg via ORAL
  Filled 2013-09-27 (×5): qty 1

## 2013-09-27 MED ORDER — WARFARIN - PHYSICIAN DOSING INPATIENT
Freq: Every day | Status: DC
  Administered 2013-09-28 – 2013-09-30 (×3)

## 2013-09-27 MED ORDER — POTASSIUM CHLORIDE CRYS ER 20 MEQ PO TBCR
20.0000 meq | EXTENDED_RELEASE_TABLET | Freq: Two times a day (BID) | ORAL | Status: DC
  Administered 2013-09-28 – 2013-09-29 (×4): 20 meq via ORAL
  Filled 2013-09-27 (×8): qty 1

## 2013-09-27 MED ORDER — SODIUM CHLORIDE 0.9 % IJ SOLN
3.0000 mL | INTRAMUSCULAR | Status: DC | PRN
  Administered 2013-09-27: 3 mL via INTRAVENOUS

## 2013-09-27 MED ORDER — WARFARIN SODIUM 5 MG PO TABS: 5.0000 mg | ORAL_TABLET | Freq: Every day | ORAL | Status: DC

## 2013-09-27 MED ORDER — METOPROLOL TARTRATE 50 MG PO TABS
50.0000 mg | ORAL_TABLET | Freq: Two times a day (BID) | ORAL | Status: DC
  Administered 2013-09-27 – 2013-10-01 (×9): 50 mg via ORAL
  Filled 2013-09-27 (×10): qty 1

## 2013-09-27 MED ORDER — WARFARIN SODIUM 7.5 MG PO TABS
7.5000 mg | ORAL_TABLET | Freq: Every day | ORAL | Status: DC
  Administered 2013-09-27 – 2013-09-29 (×3): 7.5 mg via ORAL
  Filled 2013-09-27 (×4): qty 1

## 2013-09-27 MED ORDER — ACETAMINOPHEN 325 MG PO TABS
650.0000 mg | ORAL_TABLET | Freq: Four times a day (QID) | ORAL | Status: DC | PRN
  Administered 2013-09-27 – 2013-09-30 (×3): 650 mg via ORAL
  Filled 2013-09-27 (×3): qty 2

## 2013-09-27 MED ORDER — FUROSEMIDE 10 MG/ML IJ SOLN
40.0000 mg | Freq: Two times a day (BID) | INTRAMUSCULAR | Status: DC
Start: 2013-09-27 — End: 2013-09-27

## 2013-09-27 MED ORDER — VANCOMYCIN HCL 10 G IV SOLR
2000.0000 mg | Freq: Once | INTRAVENOUS | Status: AC
  Administered 2013-09-27: 2000 mg via INTRAVENOUS
  Filled 2013-09-27: qty 2000

## 2013-09-27 MED ORDER — FERROUS SULFATE 325 (65 FE) MG PO TABS
325.0000 mg | ORAL_TABLET | Freq: Every day | ORAL | Status: DC
  Administered 2013-09-28 – 2013-10-01 (×4): 325 mg via ORAL
  Filled 2013-09-27 (×5): qty 1

## 2013-09-27 MED ORDER — SODIUM CHLORIDE 0.9 % IJ SOLN
3.0000 mL | Freq: Two times a day (BID) | INTRAMUSCULAR | Status: DC
  Administered 2013-09-27 – 2013-09-30 (×7): 3 mL via INTRAVENOUS

## 2013-09-27 MED ORDER — VANCOMYCIN HCL 10 G IV SOLR: 20.0000 mg/kg | Freq: Once | INTRAVENOUS | Status: DC

## 2013-09-27 MED ORDER — LISINOPRIL 5 MG PO TABS
5.0000 mg | ORAL_TABLET | Freq: Every day | ORAL | Status: DC
  Administered 2013-09-27 – 2013-09-29 (×3): 5 mg via ORAL
  Filled 2013-09-27 (×4): qty 1

## 2013-09-27 MED ORDER — IOHEXOL 350 MG/ML SOLN
100.0000 mL | Freq: Once | INTRAVENOUS | Status: AC | PRN
  Administered 2013-09-27: 100 mL via INTRAVENOUS

## 2013-09-27 MED ORDER — LEVOTHYROXINE SODIUM 75 MCG PO TABS
75.0000 ug | ORAL_TABLET | Freq: Every day | ORAL | Status: DC
  Administered 2013-09-27 – 2013-10-01 (×5): 75 ug via ORAL
  Filled 2013-09-27 (×6): qty 1

## 2013-09-27 MED ORDER — GUAIFENESIN ER 600 MG PO TB12
1200.0000 mg | ORAL_TABLET | Freq: Two times a day (BID) | ORAL | Status: DC
  Administered 2013-09-27 – 2013-10-01 (×9): 1200 mg via ORAL
  Filled 2013-09-27 (×11): qty 2

## 2013-09-27 MED ORDER — POTASSIUM CHLORIDE ER 10 MEQ PO TBCR
40.0000 meq | EXTENDED_RELEASE_TABLET | Freq: Two times a day (BID) | ORAL | Status: DC
Start: 2013-09-27 — End: 2013-09-27
  Administered 2013-09-27: 40 meq via ORAL
  Filled 2013-09-27 (×3): qty 4

## 2013-09-27 MED ORDER — FUROSEMIDE 10 MG/ML IJ SOLN
40.0000 mg | Freq: Once | INTRAMUSCULAR | Status: AC
  Administered 2013-09-27: 40 mg via INTRAVENOUS
  Filled 2013-09-27: qty 4

## 2013-09-27 MED ORDER — OXYCODONE-ACETAMINOPHEN 5-325 MG PO TABS
1.0000 | ORAL_TABLET | Freq: Once | ORAL | Status: AC
  Administered 2013-09-27: 1 via ORAL
  Filled 2013-09-27: qty 1

## 2013-09-27 MED ORDER — MORPHINE SULFATE 2 MG/ML IJ SOLN
1.0000 mg | INTRAMUSCULAR | Status: DC | PRN
  Administered 2013-09-27 (×2): 1 mg via INTRAVENOUS
  Filled 2013-09-27 (×2): qty 1

## 2013-09-27 MED ORDER — FOLIC ACID 1 MG PO TABS
1.0000 mg | ORAL_TABLET | Freq: Every day | ORAL | Status: DC
  Administered 2013-09-27 – 2013-10-01 (×5): 1 mg via ORAL
  Filled 2013-09-27 (×5): qty 1

## 2013-09-27 MED ORDER — ASPIRIN EC 325 MG PO TBEC
650.0000 mg | DELAYED_RELEASE_TABLET | Freq: Once | ORAL | Status: AC
  Administered 2013-09-27: 650 mg via ORAL
  Filled 2013-09-27: qty 2

## 2013-09-27 MED ORDER — DOCUSATE SODIUM 100 MG PO CAPS
200.0000 mg | ORAL_CAPSULE | Freq: Every day | ORAL | Status: DC
  Administered 2013-09-27 – 2013-09-30 (×4): 200 mg via ORAL
  Filled 2013-09-27 (×5): qty 2

## 2013-09-27 MED ORDER — TRAMADOL HCL 50 MG PO TABS
50.0000 mg | ORAL_TABLET | ORAL | Status: DC | PRN
  Administered 2013-09-27: 50 mg via ORAL
  Administered 2013-09-27: 100 mg via ORAL
  Administered 2013-09-28 – 2013-09-29 (×2): 50 mg via ORAL
  Filled 2013-09-27: qty 2
  Filled 2013-09-27 (×2): qty 1
  Filled 2013-09-27: qty 2

## 2013-09-27 MED ORDER — ENOXAPARIN SODIUM 40 MG/0.4ML ~~LOC~~ SOLN
40.0000 mg | Freq: Every day | SUBCUTANEOUS | Status: DC
Start: 2013-09-27 — End: 2013-09-30
  Administered 2013-09-27 – 2013-09-29 (×3): 40 mg via SUBCUTANEOUS
  Filled 2013-09-27 (×4): qty 0.4

## 2013-09-27 MED ORDER — PIPERACILLIN-TAZOBACTAM 3.375 G IVPB 30 MIN
3.3750 g | Freq: Once | INTRAVENOUS | Status: AC
  Administered 2013-09-27: 3.375 g via INTRAVENOUS
  Filled 2013-09-27: qty 50

## 2013-09-27 NOTE — Progress Notes (Signed)
Discontinued right midaxillary line old pleural tube sutures per Dr. Cornelius Moras. No complications noted. Paula Wolfe

## 2013-09-27 NOTE — Progress Notes (Signed)
Rn gave pt sputum trap for pt sputum assessment.

## 2013-09-27 NOTE — Procedures (Signed)
US guided Rt thoracentesis  80 cc thick bloody fluid Sent for labs per MD  unable to withdraw any additional fluid  CXR pending

## 2013-09-27 NOTE — Progress Notes (Signed)
  Echocardiogram 2D Echocardiogram has been performed.  Paula Wolfe FRANCES 09/27/2013, 5:30 PM

## 2013-09-27 NOTE — ED Notes (Signed)
MD at bedside. 

## 2013-09-27 NOTE — Consult Note (Addendum)
Advanced Heart Failure Team Consult Note  Referring Physician: Dr Cornelius Moraswen  Primary Physician: Primary Cardiologist:  Dr Katrinka BlazingSmith Cardiac Surgeon: Dr Cornelius Moraswen  Reason for Consultation: Heart Failure   HPI:   [name] is a 28 year old with h/o morbid obesity and severe MR with restricted posterior leaflet now S/P minimally invasive mitral valve repair 09/19/13.   Ad[facility] 09/19/13  for a scheduled minimally invasive valve repair. Post operative course was complicated by iron deficiency. Hemoglobin dropped to 6.3 and she received 2UPRBCs with appropriate rise in hemoglobin 8.1 and she was discharged on 09/25/13. The next day she developed a fever 102 and syncope.  Was getting up to go to bathroom and got dizzy then fell to floor.  On arrival to ER.  CT chest. No PE. Airspace densities arnoted in the right upper lobe posteriorly,  most likely atelectasis. Scattered airspace disease noted in the right mid lung and right lower lobe - atx vs PNA.  Blood cultures obtained. CT of chest. No dissection noted and  moderate R pleural effusion- UA + nitrites and 5 leukocytes Pertinent admission labs include: Hemoglobin 9.9, WBC 12.8, and K 3.5.  Follow up ECHO limited images EF ~35% +MS mean gradient 10. MVA by P1/2t 1.53 cm2 no MR.   Had R thoracentesis with only 80cc. Now with severe cough productive of clear sputum. Denies edema or weight gain. +orthopnea. No wheeze,   Review of Systems: [y] = yes, [ ]  = no   General: Weight gain [ ] ; Weight loss [ ] ; Anorexia [ ] ; Fatigue [Y ]; Fever [Y ]; Chills [Y ]; Weakness [Y ]  Cardiac: Chest pain/pressure [ ] ; Resting SOB [Y ]; Exertional SOB [ Y]; Orthopnea [ ] ; Pedal Edema [ ] ; Palpitations [ ] ; Syncope [Y ]; Presyncope [Y ]; Paroxysmal nocturnal dyspnea[ ]   Pulmonary: Cough [ Y]; Wheezing[ ] ; Hemoptysis[ ] ; Sputum [ ] ; Snoring [ ]   GI: Vomiting[ ] ; Dysphagia[ ] ; Melena[ ] ; Hematochezia [ ] ; Heartburn[ ] ; Abdominal pain [ ] ; Constipation [ ] ; Diarrhea [ ] ; BRBPR [ ]    GU: Hematuria[ ] ; Dysuria [ ] ; Nocturia[ ]   Vascular: Pain in legs with walking [ ] ; Pain in feet with lying flat [ ] ; Non-healing sores [ ] ; Stroke [ ] ; TIA [ ] ; Slurred speech [ ] ;  Neuro: Headaches[ ] ; Vertigo[ ] ; Seizures[ ] ; Paresthesias[ ] ;Blurred vision [ ] ; Diplopia [ ] ; Vision changes [ ]   Ortho/Skin: Arthritis [ ] ; Joint pain [ ] ; Muscle pain [ ] ; Joint swelling [ ] ; Back Pain [ ] ; Rash [ ]   Psych: Depression[ ] ; Anxiety[ ]   Heme: Bleeding problems [ ] ; Clotting disorders [ ] ; Anemia [ ]   Endocrine: Diabetes [ ] ; Thyroid dysfunction[ ]   Home Medications Prior to Admission medications   Medication Sig Start Date End Date Taking? Authorizing Provider  aspirin EC 81 MG EC tablet Take 1 tablet (81 mg total) by mouth daily. 09/25/13  Yes Wayne E Gold, PA-C  Ferrous Sulfate (FER-IRON PO) Take 80 mg by mouth 2 (two) times daily.   Yes Historical Provider, MD  folic acid (FOLVITE) 1 MG tablet Take 1 tablet (1 mg total) by mouth daily. 09/25/13  Yes Wayne E Gold, PA-C  furosemide (LASIX) 40 MG tablet Take 1 tablet (40 mg total) by mouth daily. 09/25/13  Yes Wayne E Gold, PA-C  levothyroxine (SYNTHROID, LEVOTHROID) 75 MCG tablet Take 75 mcg by mouth daily before breakfast.   Yes Historical Provider, MD  lisinopril (PRINIVIL,ZESTRIL) 5 MG  tablet Take 1 tablet (5 mg total) by mouth daily. 09/25/13  Yes Wayne E Gold, PA-C  metoprolol (LOPRESSOR) 50 MG tablet Take 1 tablet (50 mg total) by mouth 2 (two) times daily. 09/25/13  Yes Wayne E Gold, PA-C  oxyCODONE (OXY IR/ROXICODONE) 5 MG immediate release tablet Take 1-2 tablets (5-10 mg total) by mouth every 4 (four) hours as needed for moderate pain. 09/25/13  Yes Wayne E Gold, PA-C  warfarin (COUMADIN) 5 MG tablet Take 1 tablet (5 mg total) by mouth daily at 6 PM. 09/25/13  Yes Rowe Clack, PA-C    Past Medical History: Past Medical History  Diagnosis Date  . Hypothyroidism   . Nontoxic multinodular goiter     a. Pt reports prior ultrasound/bx that  were negative but was told she would need to keep an eye on them in the future. Benign FNA per [facility] for this dx.  . Menometrorrhagia     a. Improved after tx of hypothyroidism, but recurred 05/2013.  . Broken foot     a. Remote broken foot with residual chronic LLE edema.  . Acute on chronic combined systolic and diastolic heart failure 09/12/2013  . Mitral regurgitation 09/12/2013    severe by TEE  . Multinodular goiter   . Pulmonary hypertension 09/13/2013  . Obesity   . Anemia     iron deficient, attributed to menometrorrhagia  . Chronic combined systolic and diastolic CHF (congestive heart failure)   . Morbid obesity with BMI of 45.0-49.9, adult 09/16/2013  . H/O syncope     prior to finding out about the mitral regurgitation  . Shortness of breath     sometimes with exertion  . Headache(784.0)     headaches  . 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 Non-ischemic cardiomyopathy     Past Surgical History: Past Surgical History  Procedure Laterality Date  . External ear surgery    . Mitral valve repair Right 09/19/2013    Procedure: MINIMALLY INVASIVE MITRAL VALVE REPAIR (MVR) OR REPLACEMENT;  Surgeon: Purcell Nails, MD;  Location: [facility] OR;  Service: Open Heart Surgery;  Laterality: Right;  . Intraoperative transesophageal echocardiogram N/A 09/19/2013    Procedure: INTRAOPERATIVE TRANSESOPHAGEAL ECHOCARDIOGRAM;  Surgeon: Purcell Nails, MD;  Location: [facility] OR;  Service: Open Heart Surgery;  Laterality: N/A;    Family History: Family History  Problem Relation Age of Onset  . Hypertension Mother   . Hypertension Sister   . Hypertension Father   . Heart Problems Maternal Grandfather     details unclear    Social History: History   Social History  . Marital Status: Single    Spouse Name: N/A    Number of Children: N/A  . Years of Education: N/A    Occupational History  .      Teaching   Social History Main Topics  . Smoking status: Never Smoker   . Smokeless tobacco: Never Used  . Alcohol Use: Yes     Comment: occasionally - 1-2x/month  . Drug Use: No  . Sexual Activity: Yes    Birth Control/ Protection: None   Other Topics Concern  . None   Social History Narrative  . None    Allergies:  No Known Allergies  Objective:    Vital Signs:   Temp:  [98 F (36.7 C)-98.9 F (37.2 C)] 98 F (36.7 C) (02/13 0724)  Pulse Rate:  [79-119] 119 (02/13 0959) Resp:  [17-28] 22 (02/13 0959) BP: (112-147)/(57-85) 138/73 mmHg (02/13 0900) SpO2:  [96 %-100 %] 100 % (02/13 0959) Weight:  [262 lb 12.6 oz (119.2 kg)] 262 lb 12.6 oz (119.2 kg) (02/13 0345)    Weight change: Filed Weights   09/27/13 0345  Weight: 262 lb 12.6 oz (119.2 kg)    Intake/Output:   Intake/Output Summary (Last 24 hours) at 09/27/13 1104 Last data filed at 09/27/13 1100  Gross per 24 hour  Intake    500 ml  Output   2250 ml  Net  -1750 ml     Physical Exam: General:  Obese woman + coughing and SOB  HEENT: normal Neck: supple. JVP hard to see. Looks down . Carotids 2+ bilat; no bruits. No lymphadenopathy or thryomegaly appreciated. Cor: PMI nondisplaced. Tachy regular. Distant HS. No MR   Lungs: tachypneic. + rhonchi.  Abdomen: obese soft, nontender, nondistended. No hepatosplenomegaly. No bruits or masses. Good bowel sounds. Extremities: no cyanosis, clubbing, rash, edema Neuro: alert & orientedx3, cranial nerves grossly intact. moves all 4 extremities w/o difficulty. Affect pleasant  Telemetry: ST 100-110  Labs: Basic Metabolic Panel:  Recent Labs Lab 09/20/13 1700  09/21/13 0424 09/22/13 0405 09/24/13 0406 09/25/13 0320 09/27/13 0001 09/27/13 0013  NA  --   < > 137 137 141 142 138 139  K  --   < > 4.3 4.1 4.1 4.1 3.5* 3.3*  CL  --   < > 103 103 104 104 99 102  CO2  --   --  23 23 22 24 22   --   GLUCOSE  --   < > 103* 112* 92  93 110* 116*  BUN  --   < > 13 15 13 13 13 11   CREATININE 1.00  < > 1.04 0.90 0.97 1.01 0.95 0.90  CALCIUM  --   < > 8.0* 8.3* 8.5 8.6 8.6  --   MG 2.5  --   --   --   --   --   --   --   < > = values in this interval not displayed.  Liver Function Tests: No results found for this basename: AST, ALT, ALKPHOS, BILITOT, PROT, ALBUMIN,  in the last 168 hours No results found for this basename: LIPASE, AMYLASE,  in the last 168 hours No results found for this basename: AMMONIA,  in the last 168 hours  CBC:  Recent Labs Lab 09/22/13 0405 09/24/13 0406 09/24/13 0650 09/25/13 0320 09/27/13 0001 09/27/13 0013  WBC 13.4* 12.8* 13.7* 12.4* 11.0*  --   NEUTROABS  --   --   --   --  7.4  --   HGB 7.5* 6.5* 6.3* 8.1* 9.9* 10.9*  HCT 24.7* 21.0* 20.2* 24.9* 30.6* 32.0*  MCV 75.3* 74.5* 74.0* 77.3* 77.1*  --   PLT 181 343 349 311 424*  --     Cardiac Enzymes: No results found for this basename: CKTOTAL, CKMB, CKMBINDEX, TROPONINI,  in the last 168 hours  BNP: BNP (last 3 results)  Recent Labs  09/12/13 0013 09/14/13 0345 09/24/13 0406  PROBNP 260.0* 293.7* 845.8*    CBG:  Recent Labs Lab 09/21/13 0419 09/21/13 0816 09/21/13 1226 09/23/13 1045 09/24/13 0827  GLUCAP 100* 109* 100* 115* 118*    Coagulation Studies:  Recent Labs  09/25/13 0320 09/27/13 0046  LABPROT 16.8* 15.6*  INR 1.40 1.27    Other results: EKG: Sinus tach 103. LAE.  No ST-T wave abnormalities.     Imaging: Dg Chest 2 View  09/27/2013   CLINICAL DATA:  Chest pain.  Fever.  Weakness.  EXAM: CHEST  2 VIEW  COMPARISON:  DG CHEST 2 VIEW dated 09/23/2013; DG CHEST 1V PORT dated 09/22/2013; DG CHEST 2 VIEW dated 09/22/2013; DG CHEST 1V PORT dated 09/20/2013; DG CHEST 1V PORT dated 09/21/2013  FINDINGS: Stable configuration of the chest compared to prior exam. No definite superimposed airspace disease. Scarring and postsurgical changes in the right chest appears similar. Small right pleural effusion. Trachea  remains midline. The cardiopericardial silhouette appears similar.  IMPRESSION: Short-term stability of the chest with postsurgical changes in the right hemithorax and scarring. No definite acute superimposed cardiopulmonary disease.   Electronically Signed   By: Andreas Newport M.D.   On: 09/27/2013 00:18   Dg Chest Portable 1 View  09/27/2013   CLINICAL DATA:  Chest pain, fever  EXAM: PORTABLE CHEST - 1 VIEW  COMPARISON:  September 26, 2013  FINDINGS: The heart size and mediastinal contours are stable. The heart size is enlarged. Scarring and postsurgical changes are identified in the right chest unchanged. The left lung is clear. The visualized skeletal structures are stable.  IMPRESSION: Stable changes of the right chest. The left lung is clear. Cardiomegaly.   Electronically Signed   By: Sherian Rein M.D.   On: 09/27/2013 02:30   Ct Angio Chest Aortic Dissect W &/or W/o  09/27/2013   ADDENDUM REPORT: 09/27/2013 09:48  ADDENDUM: These results were called by telephone at the time of interpretation on 09/27/2013 at 9:30 AM to Dr. Tressie Stalker , who verbally acknowledged these results.   Electronically Signed   By: Charlett Nose M.D.   On: 09/27/2013 09:48   09/27/2013   CLINICAL DATA:  Recent mitral valve repair. Shortness of breath, cough, syncope. Fever. Rule out aortic dissection.  EXAM: CT ANGIOGRAPHY CHEST, ABDOMEN AND PELVIS  TECHNIQUE: Multidetector CT imaging through the chest, abdomen and pelvis was performed using the standard protocol during bolus administration of intravenous contrast. Multiplanar reconstructed images and MIPs were obtained and reviewed to evaluate the vascular anatomy.  CONTRAST:  OMNIPAQUE IOHEXOL 350 MG/ML SOLN  COMPARISON:  DG CHEST 1V PORT dated 09/27/2013; CT ANGIO CHEST W/CM &/OR WO/CM dated 09/12/2013  FINDINGS: CTA CHEST FINDINGS  Postoperative changes are noted from prior mitral valve replacement. Surgical clips in the right anterior mediastinum. Heart is mildly  enlarged.  There is a moderate right pleural effusion. This is partially loculated anteriorly. The majority is free-flowing posteriorly. There are locules a gas scattered throughout the pleural space. The pleural fluid measures slightly higher density than expected simple fluid at 43 Hounsfield units. This could suggest this is at least partially related to hemorrhage/blood products. Fluid/soft tissue stranding extends into the anterior mediastinum along with scattered locules of gas in the mediastinum.  Airspace densities are noted in the right upper lobe posteriorly, most likely atelectasis. Scattered airspace disease noted in the right mid lung and right lower lobe. While this may reflect atelectasis, I cannot exclude pneumonia.  Left lung is clear without effusion. No mediastinal, hilar or axillary adenopathy.  Aorta is normal caliber.  No evidence of aortic dissection.  Review of the MIP images confirms the above findings.  CTA ABDOMEN AND PELVIS FINDINGS  Aorta is normal caliber. No evidence of aortic dissection. Mesenteric vessels and single renal arteries are widely patent. Iliofemoral vessels are widely patent. No evidence of dissection or aneurysm.  Postsurgical changes in the right growing with surgical clips. Fluid/stranding noted around the femoral vessels, likely postoperative seroma or hematoma. Stranding and locules of gas within the subcutaneous soft tissues, presumably postoperative.  Liver, gallbladder, spleen, pancreas, adrenals and kidneys are normal. Uterus, adnexa are normal. Urinary bladder is decompressed. Stomach, large and small bowel grossly unremarkable. No free fluid, free air or adenopathy.  No acute bony abnormality.  Review of the MIP images confirms the above findings.  IMPRESSION: Chest:  No evidence of aortic aneurysm or dissection.  Postoperative changes in the right chest and anterior mediastinum. Partially loculated moderate sized right pleural effusion. Scattered locules of  gas noted within the right pleural space. While this may be postoperative, I cannot exclude empyema.  Stranding/ fluid in the anterior mediastinum with scattered locules of gas. Again, this may be postoperative, but I cannot exclude mediastinitis.  Scattered airspace opacities in the right lung which may reflect atelectasis or infiltrate/pneumonia.  Abdomen:  No evidence of aortic aneurysm or dissection.  Postoperative changes and small fluid collection within the right groin.  Electronically Signed: By: Charlett Nose M.D. On: 09/27/2013 09:32   Ct Angio Abd/pel W/ And/or W/o  09/27/2013   ADDENDUM REPORT: 09/27/2013 09:48  ADDENDUM: These results were called by telephone at the time of interpretation on 09/27/2013 at 9:30 AM to Dr. Tressie Stalker , who verbally acknowledged these results.   Electronically Signed   By: Charlett Nose M.D.   On: 09/27/2013 09:48   09/27/2013   CLINICAL DATA:  Recent mitral valve repair. Shortness of breath, cough, syncope. Fever. Rule out aortic dissection.  EXAM: CT ANGIOGRAPHY CHEST, ABDOMEN AND PELVIS  TECHNIQUE: Multidetector CT imaging through the chest, abdomen and pelvis was performed using the standard protocol during bolus administration of intravenous contrast. Multiplanar reconstructed images and MIPs were obtained and reviewed to evaluate the vascular anatomy.  CONTRAST:  OMNIPAQUE IOHEXOL 350 MG/ML SOLN  COMPARISON:  DG CHEST 1V PORT dated 09/27/2013; CT ANGIO CHEST W/CM &/OR WO/CM dated 09/12/2013  FINDINGS: CTA CHEST FINDINGS  Postoperative changes are noted from prior mitral valve replacement. Surgical clips in the right anterior mediastinum. Heart is mildly enlarged.  There is a moderate right pleural effusion. This is partially loculated anteriorly. The majority is free-flowing posteriorly. There are locules a gas scattered throughout the pleural space. The pleural fluid measures slightly higher density than expected simple fluid at 43 Hounsfield units. This  could suggest this is at least partially related to hemorrhage/blood products. Fluid/soft tissue stranding extends into the anterior mediastinum along with scattered locules of gas in the mediastinum.  Airspace densities are noted in the right upper lobe posteriorly, most likely atelectasis. Scattered airspace disease noted in the right mid lung and right lower lobe. While this may reflect atelectasis, I cannot exclude pneumonia.  Left lung is clear without effusion. No mediastinal, hilar or axillary adenopathy.  Aorta is normal caliber.  No evidence of aortic dissection.  Review of the MIP images confirms the above findings.  CTA ABDOMEN AND PELVIS FINDINGS  Aorta is normal caliber. No evidence of aortic dissection. Mesenteric vessels and single renal arteries are widely patent. Iliofemoral vessels are widely patent. No evidence of dissection or aneurysm.  Postsurgical changes in the right growing with surgical clips. Fluid/stranding noted around the femoral vessels, likely postoperative seroma or hematoma. Stranding and locules of gas within the subcutaneous soft tissues, presumably postoperative.  Liver, gallbladder, spleen, pancreas, adrenals and kidneys are normal. Uterus, adnexa are normal.  Urinary bladder is decompressed. Stomach, large and small bowel grossly unremarkable. No free fluid, free air or adenopathy.  No acute bony abnormality.  Review of the MIP images confirms the above findings.  IMPRESSION: Chest:  No evidence of aortic aneurysm or dissection.  Postoperative changes in the right chest and anterior mediastinum. Partially loculated moderate sized right pleural effusion. Scattered locules of gas noted within the right pleural space. While this may be postoperative, I cannot exclude empyema.  Stranding/ fluid in the anterior mediastinum with scattered locules of gas. Again, this may be postoperative, but I cannot exclude mediastinitis.  Scattered airspace opacities in the right lung which may  reflect atelectasis or infiltrate/pneumonia.  Abdomen:  No evidence of aortic aneurysm or dissection.  Postoperative changes and small fluid collection within the right groin.  Electronically Signed: By: Charlett Nose M.D. On: 09/27/2013 09:32      Medications:     Current Medications: . docusate sodium  200 mg Oral Daily  . enoxaparin (LOVENOX) injection  40 mg Subcutaneous Daily  . [START ON 09/28/2013] ferrous sulfate  325 mg Oral Q breakfast  . folic acid  1 mg Oral Daily  . furosemide  40 mg Intravenous BID  . levothyroxine  75 mcg Oral QAC breakfast  . lisinopril  5 mg Oral Daily  . metoprolol  50 mg Oral BID  . pantoprazole  40 mg Oral QAC breakfast  . potassium chloride  40 mEq Oral BID  . [START ON 09/28/2013] potassium chloride  20 mEq Oral BID  . sodium chloride  3 mL Intravenous Q12H  . warfarin  7.5 mg Oral q1800  . Warfarin - Physician Dosing Inpatient   Does not apply q1800     Infusions:      Assessment:   1. Acute respiratory distress/cough  2. Syncope 3. MR S/P MV ring 4. ? UTI  5. Iron deficient Anemia 6. New onset systolic dysfunction - possibly EF 35% 7. Morbid obesity   Plan/Discussion:    Patient seen and examined with Tonye Becket, NP. We discussed all aspects of the encounter. I agree with the assessment and plan as stated above.   I have reviewed echos closely post pre-op and post-op. Post op echo very limited but EF looks to me to be moderately down though hard to tell. There is no MR but she does have probably moderate MS post MV ring. That said, I do not see any evidence of HF on exam.   I suspect main respiratory issue here is infectious likely bronchitis vs PNA. Need to exclude flu. Can check procalcitonin as well. Agree with broad spectrum abx. Would hold IV lasix as I think she is likely dry. As she recovers would repeat echo to get better sense of LV/RV function.   Syncope likely due to volume depletion/cough doubt arrhythmogenic.   We  will follow. D/w Dr. Cornelius Moras.  Truman Hayward 2:33 PM

## 2013-09-27 NOTE — ED Notes (Signed)
Phlebotomy at bedside.

## 2013-09-27 NOTE — Progress Notes (Signed)
CT surgery p.m. Rounds  Remains febrile CT chest shows only minimal right pleural effusion and IR thoracentesis removed only 80 cc Broad-spectrum antibiotics started and cultures are pending Coumadin 7.5 mg ordered for this p.m.

## 2013-09-27 NOTE — H&P (Addendum)
[address]       [place] [id number]             [phone]          CARDIOTHORACIC SURGERY HISTORY AND PHYSICAL EXAM  PCP is [name], [name], MD Referring Provider is Veatrice KellsSMITH, III HENRY, MD   Chief Complaint:  SOB, fever/chills, near syncope  HPI:  [name] is a 28 yo morbidly obese female with dilated non-ischemic cardiomyopathy, chronic combined systolic and diastolic congestive heart failure, pulmonary hypertension, and chronic iron-deficient anemia who was just discharged from the hospital yesterday in good condition s/p minimally invasive mitral valve repair on 09/19/2013.  Her hospital stay was complicated by acute exacerbation of chronic iron deficiency anemia, which prior to surgery has been attributed to menometrorrhagia. Postoperatively her hemoglobin dropped down to 6.3 on 2/10 and she was transfused two units which then brought her up to 8.1. Post-op echo performed 2/10 was notable for poor acoustic windows but confirmed intact mitral valve repair with no residual mitral regurgitation.  The mean transvalvular gradient across the mitral valve was estimated 10 mmHg.  LV function was severely reduced.  There was no pericardial effusion.  She was discharged home with her family, but the afternoon after discharge she reportedly developed a fever >102 degrees.  She got up from a chair and nearly collapsed, having to be helped back to her chair without falling.  She was brought to ED where she complained of SOB and cough and some mild lower abdominal pain.  She has been afebrile since arrival, and all lab work looks normal with exception of urinalysis that may be suggestive of UTI.  Her hemoglobin was up to 9.9.  At present the patient reports some SOB and pain in right chest which is essentially unchanged over the past several days.  She has been eating and denies nausea or vomiting.  She denies any dizziness.  She and her family also report that she has had syncopal  episodes in the past and underwent a fairly extensive work up when she lived in [place].  Past Medical History  Diagnosis Date  . Hypothyroidism   . Nontoxic multinodular goiter     a. Pt reports prior ultrasound/bx that were negative but was told she would need to keep an eye on them in the future. Benign FNA per [facility] for this dx.  . Menometrorrhagia     a. Improved after tx of hypothyroidism, but recurred 05/2013.  . Broken foot     a. Remote broken foot with residual chronic LLE edema.  . Acute on chronic combined systolic and diastolic heart failure 09/12/2013  . Mitral regurgitation 09/12/2013    severe by TEE  . Multinodular goiter   . Pulmonary hypertension 09/13/2013  . Obesity   . Anemia     iron deficient, attributed to menometrorrhagia  . Chronic combined systolic and diastolic CHF (congestive heart failure)   . Morbid obesity with BMI of 45.0-49.9, adult 09/16/2013  . H/O syncope     prior to finding out about the mitral regurgitation  . Shortness of breath     sometimes with exertion  . Headache(784.0)     headaches  . 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 Non-ischemic cardiomyopathy     Past Surgical History  Procedure Laterality Date  . External ear surgery    .  Mitral valve repair Right 09/19/2013    Procedure: MINIMALLY INVASIVE MITRAL VALVE REPAIR (MVR) OR REPLACEMENT;  Surgeon: Purcell Nails, MD;  Location: [facility] OR;  Service: Open Heart Surgery;  Laterality: Right;  . Intraoperative transesophageal echocardiogram N/A 09/19/2013    Procedure: INTRAOPERATIVE TRANSESOPHAGEAL ECHOCARDIOGRAM;  Surgeon: Purcell Nails, MD;  Location: [facility] OR;  Service: Open Heart Surgery;  Laterality: N/A;    Family History  Problem Relation Age of Onset  . Hypertension Mother   . Hypertension Sister   . Hypertension Father   . Heart Problems Maternal Grandfather      details unclear    Social History History  Substance Use Topics  . Smoking status: Never Smoker   . Smokeless tobacco: Never Used  . Alcohol Use: Yes     Comment: occasionally - 1-2x/month    Prior to Admission medications   Medication Sig Start Date End Date Taking? Authorizing Provider  aspirin EC 81 MG EC tablet Take 1 tablet (81 mg total) by mouth daily. 09/25/13  Yes Wayne E Gold, PA-C  Ferrous Sulfate (FER-IRON PO) Take 80 mg by mouth 2 (two) times daily.   Yes Historical Provider, MD  folic acid (FOLVITE) 1 MG tablet Take 1 tablet (1 mg total) by mouth daily. 09/25/13  Yes Wayne E Gold, PA-C  furosemide (LASIX) 40 MG tablet Take 1 tablet (40 mg total) by mouth daily. 09/25/13  Yes Wayne E Gold, PA-C  levothyroxine (SYNTHROID, LEVOTHROID) 75 MCG tablet Take 75 mcg by mouth daily before breakfast.   Yes Historical Provider, MD  lisinopril (PRINIVIL,ZESTRIL) 5 MG tablet Take 1 tablet (5 mg total) by mouth daily. 09/25/13  Yes Wayne E Gold, PA-C  metoprolol (LOPRESSOR) 50 MG tablet Take 1 tablet (50 mg total) by mouth 2 (two) times daily. 09/25/13  Yes Wayne E Gold, PA-C  oxyCODONE (OXY IR/ROXICODONE) 5 MG immediate release tablet Take 1-2 tablets (5-10 mg total) by mouth every 4 (four) hours as needed for moderate pain. 09/25/13  Yes Wayne E Gold, PA-C  warfarin (COUMADIN) 5 MG tablet Take 1 tablet (5 mg total) by mouth daily at 6 PM. 09/25/13  Yes Wayne E Gold, PA-C    No Known Allergies  Review of Systems:  General:  normal appetite, decreased energy   Respiratory:  cough, no wheezing, no hemoptysis, tightness in the chest with inspiration, shortness of breath   Cardiac:  chest tightness, exertional SOB, resting SOB, no PND, no orthopnea, LE edema, no palpitations, near syncope  GI:   no difficulty swallowing, no hematochezia, no hematemesis, no melena, no constipation, no diarrhea   GU:   No dysuria, no urgency, no frequency  Musculoskeletal: no arthritis, no arthralgia    Vascular:  no pain suggestive of claudication  Neuro:   no symptoms suggestive of TIA's, no seizures, yes headaches, no peripheral neuropathy   Endocrine:  Negative  HEENT:  no loose teeth or painful teeth,  no recent vision changes  Psych:   no anxiety, no depression    Physical Exam:   BP 130/85  Pulse 104  Temp(Src) 98 F (36.7 C) (Oral)  Resp 23  Ht 5\' 2"  (1.575 m)  Wt 119.2 kg (262 lb 12.6 oz)  BMI 48.05 kg/m2  SpO2 100%  LMP 08/16/2013  General:  Visually short of breath  HEENT:  Unremarkable   Neck:   no JVD, no bruits, no adenopathy   Chest:   Some bilateral crackles especially on the left side  CV:  RRR, no murmur   Abdomen:  soft, non-tender, no masses   Extremities:  warm, well-perfused, pulses, bilateral pedal edema  Rectal/GU  Deferred  Neuro:   Grossly non-focal and symmetrical throughout  Skin:   Clean and dry, no rashes, no breakdown, wound healing well-no signs of infection.  Diagnostic Tests:  Results for CALIAH, KOPKE (MRN [id number]) as of 09/27/2013 10:00  Ref. Range 09/27/2013 00:01  Sodium Latest Range: 137-147 mEq/L 138  Potassium Latest Range: 3.7-5.3 mEq/L 3.5 (L)  Chloride Latest Range: 96-112 mEq/L 99  CO2 Latest Range: 19-32 mEq/L 22  BUN Latest Range: 6-23 mg/dL 13  Creatinine Latest Range: 0.50-1.10 mg/dL 4.09  Calcium Latest Range: 8.4-10.5 mg/dL 8.6  GFR calc non Af Amer Latest Range: >90 mL/min 82 (L)  GFR calc Af Amer Latest Range: >90 mL/min >90  Glucose Latest Range: 70-99 mg/dL 811 (H)  WBC Latest Range: 4.0-10.5 K/uL 11.0 (H)  RBC Latest Range: 3.87-5.11 MIL/uL 3.97  Hemoglobin Latest Range: 12.0-15.0 g/dL 9.9 (L)  HCT Latest Range: 36.0-46.0 % 30.6 (L)  MCV Latest Range: 78.0-100.0 fL 77.1 (L)  MCH Latest Range: 26.0-34.0 pg 24.9 (L)  MCHC Latest Range: 30.0-36.0 g/dL 91.4  RDW Latest Range: 11.5-15.5 % 20.7 (H)  Platelets Latest Range: 150-400 K/uL 424 (H)  Neutrophils Relative % Latest Range: 43-77 % 68   Lymphocytes Relative Latest Range: 12-46 % 19  Monocytes Relative Latest Range: 3-12 % 13 (H)  Eosinophils Relative Latest Range: 0-5 % 1  Basophils Relative Latest Range: 0-1 % 0  NEUT# Latest Range: 1.7-7.7 K/uL 7.4  Lymphocytes Absolute Latest Range: 0.7-4.0 K/uL 2.1  Monocytes Absolute Latest Range: 0.1-1.0 K/uL 1.4 (H)  Eosinophils Absolute Latest Range: 0.0-0.7 K/uL 0.1  Basophils Absolute Latest Range: 0.0-0.1 K/uL 0.0    Chest xray  CLINICAL DATA: Chest pain, fever  EXAM:  PORTABLE CHEST - 1 VIEW  COMPARISON: September 26, 2013  FINDINGS:  The heart size and mediastinal contours are stable. The heart size  is enlarged. Scarring and postsurgical changes are identified in the  right chest unchanged. The left lung is clear. The visualized  skeletal structures are stable.  IMPRESSION:  Stable changes of the right chest. The left lung is clear.  Cardiomegaly.  Electronically Signed  By: Sherian Rein M.D.  On: 09/27/2013 02:30  EKG:  Sinus rhythm     Impression:  Near syncopal event sounds like it may have been due to orthostatic drop in BP when she got up from chair after having a fever all afternoon.  However, patient reportedly has a h/o syncope in the distant past as well.  She has not had any signs of arrhythmia, and f/u ECHO performed 3 days ago was notable for the absence of pericardial effusion.  Anemia is improved.  She does have significant mitral stenosis which is expected s/p restrictive mitral ring annuloplasty, with pre-existing severe non-ischemic cardiomyopathy and pulmonary hypertension.  She has a productive cough although no infiltrate on CXR.  She may have a UTI.  She has a small-moderate right pleural effusion and significant RLL atelectasis.    Plan:  Continue empiric antibiotics and f/u cultures.  Check CTA chest/abdomen/pelvis to r/o aortic dissection.  Check f/u ECHO.  Will ask Heart Failure team to see her in consultation and assist w/  management.  Low dose lovenox for DVT prophylaxis until she's therapeutic on coumadin.  Mobilize.  Pulm toilet.  Consider thoracentesis.    Jari Favre, PA-S    I have seen  and examined the patient and agree with the assessment and plan as outlined.  Hansika Leaming H 09/27/2013 10:11 AM

## 2013-09-27 NOTE — Plan of Care (Signed)
Problem: Phase I Progression Outcomes Goal: Flu/PneumoVaccines if indicated Outcome: Not Applicable Date Met:  09/27/13 Patient refused even after educated.     

## 2013-09-27 NOTE — ED Notes (Signed)
Phlebotomy currently at bedside to attempt collection of 3rd blood culture set.

## 2013-09-27 NOTE — Plan of Care (Signed)
Problem: ICU Phase Progression Outcomes Goal: Flu/PneumoVaccines if indicated Outcome: Not Applicable Date Met:  97/58/83 Pt refused.

## 2013-09-27 NOTE — ED Notes (Signed)
Entered room to start next antibiotic... Patient appeared less awake, with cough, audible rales from bedside, and exhibiting tachypnea. MD called to bedside to reevaluate patient.   Family explains that patient had a multi symptom tylenol liquid dose approximately 5.5 hours ago. Prior that that dose, patient was reported to have had the same symptoms.

## 2013-09-27 NOTE — ED Notes (Signed)
MD advised that patient will be changed to ICU status.

## 2013-09-28 ENCOUNTER — Inpatient Hospital Stay (HOSPITAL_COMMUNITY): Payer: BC Managed Care – PPO

## 2013-09-28 DIAGNOSIS — J189 Pneumonia, unspecified organism: Secondary | ICD-10-CM

## 2013-09-28 DIAGNOSIS — J9600 Acute respiratory failure, unspecified whether with hypoxia or hypercapnia: Secondary | ICD-10-CM

## 2013-09-28 LAB — BASIC METABOLIC PANEL
BUN: 11 mg/dL (ref 6–23)
CO2: 23 mEq/L (ref 19–32)
Calcium: 8.6 mg/dL (ref 8.4–10.5)
Chloride: 101 mEq/L (ref 96–112)
Creatinine, Ser: 0.84 mg/dL (ref 0.50–1.10)
GFR calc Af Amer: >90 mL/min (ref >90)
GFR calc non Af Amer: >90 mL/min (ref >90)
Glucose, Bld: 90 mg/dL (ref 70–99)
Potassium: 4 mEq/L (ref 3.7–5.3)
Sodium: 138 mEq/L (ref 137–147)

## 2013-09-28 LAB — EXPECTORATED SPUTUM ASSESSMENT W GRAM STAIN, RFLX TO RESP C: Special Requests: NORMAL

## 2013-09-28 LAB — CBC
HCT: 29.3 % — ABNORMAL LOW (ref 36.0–46.0)
Hemoglobin: 9.3 g/dL — ABNORMAL LOW (ref 12.0–15.0)
MCH: 24.9 pg — ABNORMAL LOW (ref 26.0–34.0)
MCHC: 31.7 g/dL (ref 30.0–36.0)
MCV: 78.3 fL (ref 78.0–100.0)
Platelets: 453 10*3/uL — ABNORMAL HIGH (ref 150–400)
RBC: 3.74 MIL/uL — ABNORMAL LOW (ref 3.87–5.11)
RDW: 20.8 % — ABNORMAL HIGH (ref 11.5–15.5)
WBC: 12.8 10*3/uL — ABNORMAL HIGH (ref 4.0–10.5)

## 2013-09-28 LAB — PRO B NATRIURETIC PEPTIDE: PRO B NATRI PEPTIDE: 514.5 pg/mL — AB (ref 0–125)

## 2013-09-28 LAB — EXPECTORATED SPUTUM ASSESSMENT W REFEX TO RESP CULTURE

## 2013-09-28 LAB — PROTIME-INR
INR: 1.37 (ref 0.00–1.49)
Prothrombin Time: 16.5 seconds — ABNORMAL HIGH (ref 11.6–15.2)

## 2013-09-28 MED ORDER — PIPERACILLIN-TAZOBACTAM 3.375 G IVPB
3.3750 g | Freq: Three times a day (TID) | INTRAVENOUS | Status: DC
  Administered 2013-09-28 – 2013-09-29 (×3): 3.375 g via INTRAVENOUS
  Filled 2013-09-28 (×5): qty 50

## 2013-09-28 MED ORDER — VANCOMYCIN HCL IN DEXTROSE 750-5 MG/150ML-% IV SOLN
750.0000 mg | Freq: Two times a day (BID) | INTRAVENOUS | Status: DC
  Administered 2013-09-28 – 2013-09-29 (×4): 750 mg via INTRAVENOUS
  Filled 2013-09-28 (×5): qty 150

## 2013-09-28 MED ORDER — BISACODYL 5 MG PO TBEC: 10.0000 mg | DELAYED_RELEASE_TABLET | Freq: Every day | ORAL | Status: DC | PRN

## 2013-09-28 MED ORDER — BISACODYL 10 MG RE SUPP: 10.0000 mg | Freq: Every day | RECTAL | Status: DC | PRN

## 2013-09-28 NOTE — Evaluation (Signed)
Physical Therapy Evaluation Patient Details Name: Paula AblesShatina Renee Wolfe MRN: [id number] DOB: 08/23/1985 Today's Date: 09/28/2013 Time: [id number] PT Time Calculation (min): 18 min  PT Assessment / Plan / Recommendation History of Present Illness   Pt admitted for syncopal episode and fever. Pt recently d/c'd from hospital 09/19/2013 s/p minimally invasive mitral valve repair.  Clinical Impression  Pt mobilizing well but with SOB and increased HR with ambulation. Pt requires increased time to catch breath however from mobility stand point safe to d/c home with assist of mother pending stair negotiation. Pt deferred today due to + SOB with just ambulation. Pt to benefit from cardiac rehab s/p discharge once MD feels pt appropriate.    PT Assessment  Patient needs continued PT services    Follow Up Recommendations  No PT follow up;Supervision - Intermittent (recommend cardiac rehab when appropriate)    Does the patient have the potential to tolerate intense rehabilitation      Barriers to Discharge        Equipment Recommendations  None recommended by PT    Recommendations for Other Services     Frequency Min 2X/week    Precautions / Restrictions Precautions Precautions: None Precaution Comments: did have recent fall at home due to syncopal but pt currently with no dizziness Restrictions Weight Bearing Restrictions: No   Pertinent Vitals/Pain Denies pain,   HR 132 during amb Sp02 98% on RA      Mobility  Bed Mobility Overal bed mobility: Modified Independent General bed mobility comments: HOB elevated Transfers Overall transfer level: Needs assistance Transfers: Sit to/from Stand Sit to Stand: Supervision General transfer comment: cautious Ambulation/Gait Ambulation/Gait assistance: Min guard Ambulation Distance (Feet): 200 Feet Assistive device: None Gait Pattern/deviations: Step-through pattern;Decreased stride length Gait velocity: slow, guarded, cautious General  Gait Details: + SOB, SpO2 at 98%, HR 132    Exercises     PT Diagnosis: Difficulty walking  PT Problem List: Cardiopulmonary status limiting activity PT Treatment Interventions: DME instruction;Gait training;Stair training;Functional mobility training;Therapeutic activities;Therapeutic exercise     PT Goals(Current goals can be found in the care plan section) Acute Rehab PT Goals Patient Stated Goal: home PT Goal Formulation: With patient Time For Goal Achievement: 10/05/13 Potential to Achieve Goals: Good  Visit Information  Last PT Received On: 09/28/13 Assistance Needed: +1 History of Present Illness:  Pt admitted for syncopal episode and fever. Pt recently d/c'd from hospital 09/19/2013 s/p minimally invasive mitral valve repair.       Prior Functioning  Home Living Family/patient expects to be discharged to:: Private residence Living Arrangements: Alone Available Help at Discharge: Family;Available 24 hours/day (mother to stay with pt) Type of Home: Apartment Home Access: Stairs to enter Entrance Stairs-Number of Steps: 12 Entrance Stairs-Rails: Can reach both Home Layout: One level Home Equipment: None Prior Function Level of Independence: Independent Comments: pt was a Retail buyerschool teacher Communication Communication: No difficulties Dominant Hand: Right    Cognition  Cognition Arousal/Alertness: Awake/alert Behavior During Therapy: WFL for tasks assessed/performed Overall Cognitive Status: Within Functional Limits for tasks assessed    Extremity/Trunk Assessment Upper Extremity Assessment Upper Extremity Assessment: Overall WFL for tasks assessed Lower Extremity Assessment Lower Extremity Assessment: Overall WFL for tasks assessed Cervical / Trunk Assessment Cervical / Trunk Assessment: Normal   Balance Balance Overall balance assessment: No apparent balance deficits (not formally assessed)  End of Session PT - End of Session Equipment Utilized During  Treatment: Gait belt Activity Tolerance: Patient tolerated treatment well Patient left:  (on commode,  family present to assist pt) Nurse Communication: Mobility status (HR)  GP     Marcene Brawn 09/28/2013, 4:15 PM  Lewis Shock, PT, DPT Pager #: [phone] Office #: [phone]

## 2013-09-28 NOTE — ED Provider Notes (Signed)
I saw and evaluated the patient, reviewed the resident's note and I agree with the findings and plan.    Date: 09/28/2013  Rate: 121  Rhythm: normal sinus rhythm  QRS Axis: normal  Intervals: normal  ST/T Wave abnormalities: normal  Conduction Disutrbances:none  Narrative Interpretation: rate increased  Old EKG Reviewed: changes noted   Patient with fever. Recent mitral valve surgery. Has had some chest pain also. Admit to cardiothoracic surgery. Just discharged 2 days prior  Paula Wolfe. Rubin Payor, MD 09/28/13 1520

## 2013-09-28 NOTE — Progress Notes (Signed)
Patient transferred to 2 west room 2 with two rns, family and belongings. No new complications. Paula Wolfe

## 2013-09-28 NOTE — Progress Notes (Signed)
Advanced Heart Failure Team Rounding Note  HPI:   [name] is a 28 year old with h/o morbid obesity and severe MR now S/P minimally invasive mitral valve repair 09/19/13.   Admitted to [facility] 09/19/13  for a scheduled minimally invasive valve repair. Post operative course was complicated by iron deficiency. Hemoglobin dropped to 6.3 and she received 2UPRBCs with appropriate rise in hemoglobin 8.1 and she was discharged on 09/25/13. The next day she developed a fever 102 and syncope.  Was getting up to go to bathroom and got dizzy then fell to floor.  On arrival to ER.  CT chest. No PE. Airspace densities arnoted in the right upper lobe posteriorly,  most likely atelectasis. Scattered airspace disease noted in the right mid lung and right lower lobe - atx vs PNA.  Blood cultures obtained. CT of chest. No dissection noted and  moderate R pleural effusion- UA + nitrites and 5 leukocytes Pertinent admission labs include: Hemoglobin 9.9, WBC 12.8, and K 3.5.  Follow up ECHO limited images EF ~35% +MS mean gradient 10. MVA by P1/2t 1.53 cm2 no MR. Had R thoracentesis with only 80cc.   Sitting in chair. Feeling much better. Febrile to 102.8 yesterday. Afebrile since. Cough better. No dyspnea. Weight down 5 pounds below baseline.     Objective:    Vital Signs:   Temp:  [98.2 F (36.8 C)-102.8 F (39.3 C)] 98.7 F (37.1 C) (02/14 0400) Pulse Rate:  [84-121] 103 (02/14 0700) Resp:  [9-24] 20 (02/14 0700) BP: (101-157)/(37-137) 143/69 mmHg (02/14 0700) SpO2:  [97 %-100 %] 97 % (02/14 0700) Weight:  [117 kg (257 lb 15 oz)] 117 kg (257 lb 15 oz) (02/14 0500)    Weight change: Filed Weights   09/27/13 0345 09/28/13 0500  Weight: 119.2 kg (262 lb 12.6 oz) 117 kg (257 lb 15 oz)    Intake/Output:   Intake/Output Summary (Last 24 hours) at 09/28/13 0802 Last data filed at 09/28/13 0245  Gross per 24 hour  Intake    540 ml  Output    600 ml  Net    -60 ml     Physical Exam: General:  Sitting in  chair. No distress HEENT: normal Neck: supple. JVP hard to see. Looks down . Carotids 2+ bilat; no bruits. No lymphadenopathy or thryomegaly appreciated. Cor: PMI nondisplaced. Regular. Distant HS. No MR   Lungs: clear Abdomen: obese soft, nontender, nondistended. No hepatosplenomegaly. No bruits or masses. Good bowel sounds. Extremities: no cyanosis, clubbing, rash, edema Neuro: alert & orientedx3, cranial nerves grossly intact. moves all 4 extremities w/o difficulty. Affect pleasant  Telemetry: ST 100  Labs: Basic Metabolic Panel:  Recent Labs Lab 09/22/13 0405 09/24/13 0406 09/25/13 0320 09/27/13 0001 09/27/13 0013 09/28/13 0245  NA 137 141 142 138 139 138  K 4.1 4.1 4.1 3.5* 3.3* 4.0  CL 103 104 104 99 102 101  CO2 23 22 24 22   --  23  GLUCOSE 112* 92 93 110* 116* 90  BUN 15 13 13 13 11 11   CREATININE 0.90 0.97 1.01 0.95 0.90 0.84  CALCIUM 8.3* 8.5 8.6 8.6  --  8.6    Liver Function Tests: No results found for this basename: AST, ALT, ALKPHOS, BILITOT, PROT, ALBUMIN,  in the last 168 hours No results found for this basename: LIPASE, AMYLASE,  in the last 168 hours No results found for this basename: AMMONIA,  in the last 168 hours  CBC:  Recent Labs Lab 09/24/13 0406  09/24/13 0650 09/25/13 0320 09/27/13 0001 09/27/13 0013 09/28/13 0245  WBC 12.8* 13.7* 12.4* 11.0*  --  12.8*  NEUTROABS  --   --   --  7.4  --   --   HGB 6.5* 6.3* 8.1* 9.9* 10.9* 9.3*  HCT 21.0* 20.2* 24.9* 30.6* 32.0* 29.3*  MCV 74.5* 74.0* 77.3* 77.1*  --  78.3  PLT 343 349 311 424*  --  453*    Cardiac Enzymes: No results found for this basename: CKTOTAL, CKMB, CKMBINDEX, TROPONINI,  in the last 168 hours  BNP: BNP (last 3 results)  Recent Labs  09/14/13 0345 09/24/13 0406 09/28/13 0246  PROBNP 293.7* 845.8* 514.5*    CBG:  Recent Labs Lab 09/21/13 0816 09/21/13 1226 09/23/13 1045 09/24/13 0827  GLUCAP 109* 100* 115* 118*    Coagulation Studies:  Recent Labs   09/27/13 0046 09/28/13 0245  LABPROT 15.6* 16.5*  INR 1.27 1.37    Other results: EKG: Sinus tach 103. LAE. No ST-T wave abnormalities.     Imaging: Dg Chest 1 View  09/27/2013   CLINICAL DATA:  Right pleural effusion. Shortness of breath. Cough. Status post thoracentesis.  EXAM: CHEST - 1 VIEW  COMPARISON:  09/27/2013  FINDINGS: There is no pneumothorax after thoracentesis. There is slight improvement in aeration of the right lung. Moderate right pleural effusion persists. Left lung is clear.  IMPRESSION: No pneumothorax after right thoracentesis. Minimal decrease in right effusion.   Electronically Signed   By: Geanie Cooley M.D.   On: 09/27/2013 12:15   Dg Chest 2 View  09/27/2013   CLINICAL DATA:  Chest pain.  Fever.  Weakness.  EXAM: CHEST  2 VIEW  COMPARISON:  DG CHEST 2 VIEW dated 09/23/2013; DG CHEST 1V PORT dated 09/22/2013; DG CHEST 2 VIEW dated 09/22/2013; DG CHEST 1V PORT dated 09/20/2013; DG CHEST 1V PORT dated 09/21/2013  FINDINGS: Stable configuration of the chest compared to prior exam. No definite superimposed airspace disease. Scarring and postsurgical changes in the right chest appears similar. Small right pleural effusion. Trachea remains midline. The cardiopericardial silhouette appears similar.  IMPRESSION: Short-term stability of the chest with postsurgical changes in the right hemithorax and scarring. No definite acute superimposed cardiopulmonary disease.   Electronically Signed   By: Andreas Newport M.D.   On: 09/27/2013 00:18   Dg Chest Portable 1 View  09/27/2013   CLINICAL DATA:  Chest pain, fever  EXAM: PORTABLE CHEST - 1 VIEW  COMPARISON:  September 26, 2013  FINDINGS: The heart size and mediastinal contours are stable. The heart size is enlarged. Scarring and postsurgical changes are identified in the right chest unchanged. The left lung is clear. The visualized skeletal structures are stable.  IMPRESSION: Stable changes of the right chest. The left lung is clear.  Cardiomegaly.   Electronically Signed   By: Sherian Rein M.D.   On: 09/27/2013 02:30   Ct Angio Chest Aortic Dissect W &/or W/o  09/27/2013   ADDENDUM REPORT: 09/27/2013 09:48  ADDENDUM: These results were called by telephone at the time of interpretation on 09/27/2013 at 9:30 AM to Dr. Tressie Stalker , who verbally acknowledged these results.   Electronically Signed   By: Charlett Nose M.D.   On: 09/27/2013 09:48   09/27/2013   CLINICAL DATA:  Recent mitral valve repair. Shortness of breath, cough, syncope. Fever. Rule out aortic dissection.  EXAM: CT ANGIOGRAPHY CHEST, ABDOMEN AND PELVIS  TECHNIQUE: Multidetector CT imaging through the chest, abdomen and pelvis  was performed using the standard protocol during bolus administration of intravenous contrast. Multiplanar reconstructed images and MIPs were obtained and reviewed to evaluate the vascular anatomy.  CONTRAST:  OMNIPAQUE IOHEXOL 350 MG/ML SOLN  COMPARISON:  DG CHEST 1V PORT dated 09/27/2013; CT ANGIO CHEST W/CM &/OR WO/CM dated 09/12/2013  FINDINGS: CTA CHEST FINDINGS  Postoperative changes are noted from prior mitral valve replacement. Surgical clips in the right anterior mediastinum. Heart is mildly enlarged.  There is a moderate right pleural effusion. This is partially loculated anteriorly. The majority is free-flowing posteriorly. There are locules a gas scattered throughout the pleural space. The pleural fluid measures slightly higher density than expected simple fluid at 43 Hounsfield units. This could suggest this is at least partially related to hemorrhage/blood products. Fluid/soft tissue stranding extends into the anterior mediastinum along with scattered locules of gas in the mediastinum.  Airspace densities are noted in the right upper lobe posteriorly, most likely atelectasis. Scattered airspace disease noted in the right mid lung and right lower lobe. While this may reflect atelectasis, I cannot exclude pneumonia.  Left lung is clear  without effusion. No mediastinal, hilar or axillary adenopathy.  Aorta is normal caliber.  No evidence of aortic dissection.  Review of the MIP images confirms the above findings.  CTA ABDOMEN AND PELVIS FINDINGS  Aorta is normal caliber. No evidence of aortic dissection. Mesenteric vessels and single renal arteries are widely patent. Iliofemoral vessels are widely patent. No evidence of dissection or aneurysm.  Postsurgical changes in the right growing with surgical clips. Fluid/stranding noted around the femoral vessels, likely postoperative seroma or hematoma. Stranding and locules of gas within the subcutaneous soft tissues, presumably postoperative.  Liver, gallbladder, spleen, pancreas, adrenals and kidneys are normal. Uterus, adnexa are normal. Urinary bladder is decompressed. Stomach, large and small bowel grossly unremarkable. No free fluid, free air or adenopathy.  No acute bony abnormality.  Review of the MIP images confirms the above findings.  IMPRESSION: Chest:  No evidence of aortic aneurysm or dissection.  Postoperative changes in the right chest and anterior mediastinum. Partially loculated moderate sized right pleural effusion. Scattered locules of gas noted within the right pleural space. While this may be postoperative, I cannot exclude empyema.  Stranding/ fluid in the anterior mediastinum with scattered locules of gas. Again, this may be postoperative, but I cannot exclude mediastinitis.  Scattered airspace opacities in the right lung which may reflect atelectasis or infiltrate/pneumonia.  Abdomen:  No evidence of aortic aneurysm or dissection.  Postoperative changes and small fluid collection within the right groin.  Electronically Signed: By: Charlett Nose M.D. On: 09/27/2013 09:32   Ct Angio Abd/pel W/ And/or W/o  09/27/2013   ADDENDUM REPORT: 09/27/2013 09:48  ADDENDUM: These results were called by telephone at the time of interpretation on 09/27/2013 at 9:30 AM to Dr. Tressie Stalker , who  verbally acknowledged these results.   Electronically Signed   By: Charlett Nose M.D.   On: 09/27/2013 09:48   09/27/2013   CLINICAL DATA:  Recent mitral valve repair. Shortness of breath, cough, syncope. Fever. Rule out aortic dissection.  EXAM: CT ANGIOGRAPHY CHEST, ABDOMEN AND PELVIS  TECHNIQUE: Multidetector CT imaging through the chest, abdomen and pelvis was performed using the standard protocol during bolus administration of intravenous contrast. Multiplanar reconstructed images and MIPs were obtained and reviewed to evaluate the vascular anatomy.  CONTRAST:  OMNIPAQUE IOHEXOL 350 MG/ML SOLN  COMPARISON:  DG CHEST 1V PORT dated 09/27/2013; CT ANGIO  CHEST W/CM &/OR WO/CM dated 09/12/2013  FINDINGS: CTA CHEST FINDINGS  Postoperative changes are noted from prior mitral valve replacement. Surgical clips in the right anterior mediastinum. Heart is mildly enlarged.  There is a moderate right pleural effusion. This is partially loculated anteriorly. The majority is free-flowing posteriorly. There are locules a gas scattered throughout the pleural space. The pleural fluid measures slightly higher density than expected simple fluid at 43 Hounsfield units. This could suggest this is at least partially related to hemorrhage/blood products. Fluid/soft tissue stranding extends into the anterior mediastinum along with scattered locules of gas in the mediastinum.  Airspace densities are noted in the right upper lobe posteriorly, most likely atelectasis. Scattered airspace disease noted in the right mid lung and right lower lobe. While this may reflect atelectasis, I cannot exclude pneumonia.  Left lung is clear without effusion. No mediastinal, hilar or axillary adenopathy.  Aorta is normal caliber.  No evidence of aortic dissection.  Review of the MIP images confirms the above findings.  CTA ABDOMEN AND PELVIS FINDINGS  Aorta is normal caliber. No evidence of aortic dissection. Mesenteric vessels and single renal  arteries are widely patent. Iliofemoral vessels are widely patent. No evidence of dissection or aneurysm.  Postsurgical changes in the right growing with surgical clips. Fluid/stranding noted around the femoral vessels, likely postoperative seroma or hematoma. Stranding and locules of gas within the subcutaneous soft tissues, presumably postoperative.  Liver, gallbladder, spleen, pancreas, adrenals and kidneys are normal. Uterus, adnexa are normal. Urinary bladder is decompressed. Stomach, large and small bowel grossly unremarkable. No free fluid, free air or adenopathy.  No acute bony abnormality.  Review of the MIP images confirms the above findings.  IMPRESSION: Chest:  No evidence of aortic aneurysm or dissection.  Postoperative changes in the right chest and anterior mediastinum. Partially loculated moderate sized right pleural effusion. Scattered locules of gas noted within the right pleural space. While this may be postoperative, I cannot exclude empyema.  Stranding/ fluid in the anterior mediastinum with scattered locules of gas. Again, this may be postoperative, but I cannot exclude mediastinitis.  Scattered airspace opacities in the right lung which may reflect atelectasis or infiltrate/pneumonia.  Abdomen:  No evidence of aortic aneurysm or dissection.  Postoperative changes and small fluid collection within the right groin.  Electronically Signed: By: Charlett Nose M.D. On: 09/27/2013 09:32   US Thoracentesis Asp Pleural Space W/img Guide  09/27/2013   CLINICAL DATA:  Right pleural effusion ; shortness of breath; post mitral valve surgery  EXAM: ULTRASOUND GUIDED right THORACENTESIS  COMPARISON:  None  FINDINGS: A total of approximately 80 cc of bloody fluid was removed. A fluid sample wassent for laboratory analysis.  IMPRESSION: Successful ultrasound guided right thoracentesis yielding 80 cc of pleural fluid.  Read by: Beckey Downing PAC  PROCEDURE: An ultrasound guided thoracentesis was thoroughly  discussed with the patient and questions answered. The benefits, risks, alternatives and complications were also discussed. The patient understands and wishes to proceed with the procedure. Written consent was obtained.  Ultrasound was performed to localize and mark an adequate pocket of fluid in the right chest. The area was then prepped and draped in the normal sterile fashion. 1% Lidocaine was used for local anesthesia. Under ultrasound guidance a 19 gauge Yueh catheter was introduced. Thoracentesis was performed. The catheter was removed and a dressing applied.  Complications:  None   Electronically Signed   By: Richarda Overlie M.D.   On: 09/27/2013 12:16  Medications:     Current Medications: . docusate sodium  200 mg Oral Daily  . enoxaparin (LOVENOX) injection  40 mg Subcutaneous Daily  . ferrous sulfate  325 mg Oral Q breakfast  . folic acid  1 mg Oral Daily  . guaiFENesin  1,200 mg Oral BID  . levothyroxine  75 mcg Oral QAC breakfast  . lisinopril  5 mg Oral Daily  . metoprolol  50 mg Oral BID  . pantoprazole  40 mg Oral QAC breakfast  . potassium chloride  20 mEq Oral BID  . sodium chloride  3 mL Intravenous Q12H  . warfarin  7.5 mg Oral q1800  . Warfarin - Physician Dosing Inpatient   Does not apply q1800    Infusions:     Assessment:   1. Acute respiratory distress/cough  - possible PNA 2. Syncope 3. MR S/P MV ring 4. ? UTI  5. Iron deficient Anemia 6. New onset systolic dysfunction - possibly EF 35% 7. Morbid obesity   Plan/Discussion:    Much improved.   I suspect main respiratory issue here is infectious likely bronchitis vs PNA. Need to exclude flu. Continue with broad spectrum abx. Will provide IS.   Would continue to hold lasix for now as I think she is likely dry.  Can resume po lasix as she improves.   Syncope likely due to volume depletion/cough doubt arrhythmogenic.    Daniel Bensimhon,MD 8:02 AM

## 2013-09-28 NOTE — Progress Notes (Signed)
  Subjective: Doing well w/o dizziness Fever last pm 102 but cultures neg, WBC 12k Incisions clean CXR w/ atelectasis R base Walked unit w/o prob Objective: Vital signs in last 24 hours: Temp:  [98.2 F (36.8 C)-102.8 F (39.3 C)] 100.1 F (37.8 C) (02/14 0815) Pulse Rate:  [84-121] 111 (02/14 0800) Cardiac Rhythm:  [-] Sinus tachycardia (02/14 0800) Resp:  [9-24] 23 (02/14 0800) BP: (101-157)/(37-137) 144/72 mmHg (02/14 0800) SpO2:  [97 %-100 %] 100 % (02/14 0800) Weight:  [257 lb 15 oz (117 kg)] 257 lb 15 oz (117 kg) (02/14 0500)  Hemodynamic parameters for last 24 hours:  stable  Intake/Output from previous day: 02/13 0701 - 02/14 0700 In: 540 [P.O.:540] Out: 600 [Urine:600] Intake/Output this shift: Total I/O In: 220 [P.O.:220] Out: -   No murmur No edema  Lab Results:  Recent Labs  09/27/13 0001 09/27/13 0013 09/28/13 0245  WBC 11.0*  --  12.8*  HGB 9.9* 10.9* 9.3*  HCT 30.6* 32.0* 29.3*  PLT 424*  --  453*   BMET:  Recent Labs  09/27/13 0001 09/27/13 0013 09/28/13 0245  NA 138 139 138  K 3.5* 3.3* 4.0  CL 99 102 101  CO2 22  --  23  GLUCOSE 110* 116* 90  BUN 13 11 11   CREATININE 0.95 0.90 0.84  CALCIUM 8.6  --  8.6    PT/INR:  Recent Labs  09/28/13 0245  LABPROT 16.5*  INR 1.37   ABG    Component Value Date/Time   PHART 7.367 09/20/2013 0425   HCO3 19.9* 09/20/2013 0425   TCO2 24 09/27/2013 0013   ACIDBASEDEF 5.0* 09/20/2013 0425   O2SAT 93.0 09/20/2013 0425   CBG (last 3)  No results found for this basename: GLUCAP,  in the last 72 hours  Assessment/Plan: S/P  MV rep tx to telemetry Follow cultures and cont antibiotics    LOS: 2 days    VAN TRIGT III,PETER 09/28/2013

## 2013-09-29 ENCOUNTER — Inpatient Hospital Stay (HOSPITAL_COMMUNITY): Payer: BC Managed Care – PPO

## 2013-09-29 DIAGNOSIS — N390 Urinary tract infection, site not specified: Secondary | ICD-10-CM

## 2013-09-29 DIAGNOSIS — R55 Syncope and collapse: Secondary | ICD-10-CM

## 2013-09-29 LAB — COMPREHENSIVE METABOLIC PANEL
ALT: 165 U/L — ABNORMAL HIGH (ref 0–35)
AST: 61 U/L — ABNORMAL HIGH (ref 0–37)
Albumin: 2.8 g/dL — ABNORMAL LOW (ref 3.5–5.2)
Alkaline Phosphatase: 125 U/L — ABNORMAL HIGH (ref 39–117)
BUN: 9 mg/dL (ref 6–23)
CO2: 24 mEq/L (ref 19–32)
Calcium: 8.8 mg/dL (ref 8.4–10.5)
Chloride: 100 mEq/L (ref 96–112)
Creatinine, Ser: 0.96 mg/dL (ref 0.50–1.10)
GFR calc Af Amer: >90 mL/min (ref >90)
GFR calc non Af Amer: 81 mL/min — ABNORMAL LOW (ref >90)
Glucose, Bld: 94 mg/dL (ref 70–99)
Potassium: 4 mEq/L (ref 3.7–5.3)
Sodium: 139 mEq/L (ref 137–147)
Total Bilirubin: 0.9 mg/dL (ref 0.3–1.2)
Total Protein: 7.2 g/dL (ref 6.0–8.3)

## 2013-09-29 LAB — RESPIRATORY VIRUS PANEL
Adenovirus: NOT DETECTED
Influenza A H1: NOT DETECTED
Influenza A H3: NOT DETECTED
Influenza A: NOT DETECTED
Influenza B: NOT DETECTED
Metapneumovirus: NOT DETECTED
Parainfluenza 1: NOT DETECTED
Parainfluenza 2: NOT DETECTED
Parainfluenza 3: NOT DETECTED
Respiratory Syncytial Virus A: NOT DETECTED
Respiratory Syncytial Virus B: NOT DETECTED
Rhinovirus: NOT DETECTED

## 2013-09-29 LAB — CBC
HCT: 26.4 % — ABNORMAL LOW (ref 36.0–46.0)
Hemoglobin: 8.3 g/dL — ABNORMAL LOW (ref 12.0–15.0)
MCH: 24.3 pg — ABNORMAL LOW (ref 26.0–34.0)
MCHC: 31.4 g/dL (ref 30.0–36.0)
MCV: 77.4 fL — ABNORMAL LOW (ref 78.0–100.0)
Platelets: 521 10*3/uL — ABNORMAL HIGH (ref 150–400)
RBC: 3.41 MIL/uL — ABNORMAL LOW (ref 3.87–5.11)
RDW: 20.9 % — ABNORMAL HIGH (ref 11.5–15.5)
WBC: 11.2 10*3/uL — ABNORMAL HIGH (ref 4.0–10.5)

## 2013-09-29 LAB — PROTIME-INR
INR: 1.55 — ABNORMAL HIGH (ref 0.00–1.49)
Prothrombin Time: 18.2 seconds — ABNORMAL HIGH (ref 11.6–15.2)

## 2013-09-29 MED ORDER — ONDANSETRON HCL 4 MG/2ML IJ SOLN: 4.0000 mg | Freq: Four times a day (QID) | INTRAMUSCULAR | Status: DC | PRN

## 2013-09-29 MED ORDER — BENZONATATE 100 MG PO CAPS
200.0000 mg | ORAL_CAPSULE | Freq: Three times a day (TID) | ORAL | Status: DC | PRN
  Administered 2013-09-29 (×2): 200 mg via ORAL
  Filled 2013-09-29 (×3): qty 2

## 2013-09-29 MED ORDER — WARFARIN SODIUM 7.5 MG PO TABS
7.5000 mg | ORAL_TABLET | Freq: Once | ORAL | Status: DC
  Filled 2013-09-29: qty 1

## 2013-09-29 MED ORDER — CEPHALEXIN 500 MG PO CAPS
500.0000 mg | ORAL_CAPSULE | Freq: Four times a day (QID) | ORAL | Status: DC
  Administered 2013-09-29 (×2): 500 mg via ORAL
  Filled 2013-09-29 (×8): qty 1

## 2013-09-29 NOTE — Progress Notes (Signed)
VASCULAR LAB PRELIMINARY  PRELIMINARY  PRELIMINARY  PRELIMINARY  Bilateral lower extremity venous Dopplers completed.    Preliminary report:  There is no DVT or SVT noted in the bilateral lower extremities.  Janisa Labus, RVT 09/29/2013, 12:02 PM

## 2013-09-29 NOTE — Discharge Summary (Signed)
Physician Discharge Summary  Patient ID: Paula Wolfe MRN: [id number] DOB/AGE: Dec 14, 1985 27 y.o.  Admit date: 09/26/2013 Discharge date: 10/01/2013  Admission Diagnoses:  Patient Active Problem List   Diagnosis Date Noted  . UTI (urinary tract infection) 09/29/2013  . Acute respiratory failure 09/28/2013  . PNA (pneumonia) 09/28/2013  . Near syncope 09/27/2013  . Non-ischemic cardiomyopathy   . S/P minimally invasive mitral valve repair 09/19/2013  . Severe obesity (BMI >= 40) 09/16/2013  . Morbid obesity with BMI of 45.0-49.9, adult 09/16/2013  . Chronic combined systolic and diastolic CHF (congestive heart failure)   . Hypothyroidism 09/13/2013  . Multinodular goiter 09/13/2013  . Pulmonary hypertension 09/13/2013  . Obesity 09/13/2013  . Anemia, iron deficiency 09/13/2013  . SOB (shortness of breath) 09/12/2013   Discharge Diagnoses:   Patient Active Problem List   Diagnosis Date Noted  . UTI (urinary tract infection) 09/29/2013  . Acute respiratory failure 09/28/2013  . PNA (pneumonia) 09/28/2013  . Near syncope 09/27/2013  . Non-ischemic cardiomyopathy   . S/P minimally invasive mitral valve repair 09/19/2013  . Severe obesity (BMI >= 40) 09/16/2013  . Morbid obesity with BMI of 45.0-49.9, adult 09/16/2013  . Chronic combined systolic and diastolic CHF (congestive heart failure)   . Hypothyroidism 09/13/2013  . Multinodular goiter 09/13/2013  . Pulmonary hypertension 09/13/2013  . Obesity 09/13/2013  . Anemia, iron deficiency 09/13/2013  . SOB (shortness of breath) 09/12/2013   Discharged Condition: good  History of Present Illness:   Paula Wolfe is a 28 yo morbidly obese African American female with dilated non-ischemic cardiomyopathy, chronic combined systolic and diastolic congestive heart failure, pulmonary hypertension, and chronic iron-deficient anemia who was just discharged from the hospital on 09/23/2013 in good condition s/p minimally invasive  mitral valve repair on 09/19/2013.Her hospital stay was complicated by acute exacerbation of chronic iron deficiency anemia, which prior to surgery has been attributed to menometrorrhagia. Postoperatively her hemoglobin dropped down to 6.3 on 2/10 and she was transfused two units which then brought her up to 8.1. Post-op echo performed 2/10 was notable for poor acoustic windows but confirmed intact mitral valve repair with no residual mitral regurgitation. The mean transvalvular gradient across the mitral valve was estimated 10 mmHg. LV function was severely reduced. There was no pericardial effusion. She was discharged home with her family, but the afternoon after discharge she reportedly developed a fever >102 degrees. She got up from a chair and nearly collapsed, having to be helped back to her chair without falling. She was brought to ED where she complained of SOB and cough and some mild lower abdominal pain. She had been afebrile since arrival, and all lab work looks normal with exception of urinalysis that may be suggestive of UTI.  TCTS was consulted and admitted the patient to the ICU for further workup.         Hospital Course:   Upon admission the patient states that she has been experiencing some shortness of breath and right sided chest pain which remained unchanged over the past several days.  She was tolerating a diet at home with no nausea or vomiting.  She denied any further dizziness.  The patient has a notable history of syncope.  The patient was placed on empiric antibiotics and blood cultures were obtained.  CT scan of the chest was obtained and did not reveal any acute abnormalities, but did show evidence of a moderate right sided pleural effusion.  Repeat Echocardiogram was also performed and did  not show evidence of endocarditis but did show a reduced EF of 35%.  A consult to the Heart Failure team was placed and after evaluation of the patient they did not feel her to be suffering from  heart failure.  They felt her cause of shortness of breath was most likely infectious and did not recommend diuresis at that time.  She underwent Thoracentesis for her Right sided pleural effusion and 80 ml of thick bloody fluid was removed.    The patients symptoms improved.  She was ruled out for influenza and other respiratory viruses.  The patient did have some fevers during her stay it the ICU but was otherwise medically stable and transferred to the telemetry unit in stable condition.  The patient continues to progress.  She is no longer experiencing chest discomfort or shortness of breath.  She is ambulating with minimal assistance.  Blood and sputum cultures obtained have been negative so far.  The patient had a positive UA on admission, however urine culture was not performed.  This has been ordered and she was placed on Keflex for oral coverage for this.  She has been taken off all IV Antibiotics.  Her oral antibiotic has been changed to Cipro.The patient remains on Coumadin.  Her INR is trending up and is at 1.96 on 7.5 mg Coumadin daily. Because she is now on Cipro, Coumadin will be decreased to 5 daily. Should no further issues arise we anticipate discharge home in the next 24-48 hours.  She will follow up with Dr. Cornelius Moras as scheduled on 10/07/13    Consults: cardiology  Disposition: 01-Home or Self Care  Discharge Medications:    Medication List    STOP taking these medications       furosemide 40 MG tablet  Commonly known as:  LASIX     lisinopril 5 MG tablet  Commonly known as:  PRINIVIL,ZESTRIL      TAKE these medications       aspirin 81 MG EC tablet  Take 1 tablet (81 mg total) by mouth daily.     ciprofloxacin 500 MG tablet  Commonly known as:  CIPRO  Take 1 tablet (500 mg total) by mouth 2 (two) times daily. For 5 days then stop.     FER-IRON PO  Take 80 mg by mouth 2 (two) times daily.     folic acid 1 MG tablet  Commonly known as:  FOLVITE  Take 1 tablet (1 mg  total) by mouth daily.     guaiFENesin 600 MG 12 hr tablet  Commonly known as:  MUCINEX  Take 2 tablets (1,200 mg total) by mouth 2 (two) times daily as needed. For cough     levothyroxine 75 MCG tablet  Commonly known as:  SYNTHROID, LEVOTHROID  Take 75 mcg by mouth daily before breakfast.     losartan 50 MG tablet  Commonly known as:  COZAAR  Take 1 tablet (50 mg total) by mouth daily.     metoprolol 50 MG tablet  Commonly known as:  LOPRESSOR  Take 1 tablet (50 mg total) by mouth 2 (two) times daily.     oxyCODONE 5 MG immediate release tablet  Commonly known as:  Oxy IR/ROXICODONE  Take 1-2 tablets (5-10 mg total) by mouth every 4 (four) hours as needed for moderate pain.     warfarin 5 MG tablet  Commonly known as:  COUMADIN  Take 1 tablet (5 mg total) by mouth daily at 6 PM.  Future Appointments Provider Department Dept Phone   10/07/2013 9:30 AM Purcell Nails, MD [facility] [place] [phone]   10/16/2013 9:15 AM Lesleigh Noe, MD [facility] [phone]     Follow-up Information   Follow up with Purcell Nails, MD On 10/07/2013. (PA/LAT CXR to be taken (at [facility] which is in the same building as Dr. Orvan July office) at 8:30 am;Appointment with Dr. Cornelius Moras is at 9:30 am)    Specialty:  Cardiothoracic Surgery   Contact information:   [address] [place] [address] [phone]       Follow up with Lesleigh Noe, MD. (Call to have PT and INR drawn on Thursday 10/03/2013)    Specialty:  Cardiology   Contact information:   [address] [place] [address] [phone]       Follow up with Lesleigh Noe, MD On 10/16/2013. (Appointment time is at 9:15 am)    Specialty:  Cardiology   Contact information:   [address] [place] [address] [phone]       Signed: Ardelle Balls PA-C 10/01/2013, 8:40 AM

## 2013-09-29 NOTE — Progress Notes (Signed)
Pt temperature 102.1 orally, MD on call notified.  Orders received for  blood cultures to be drawn and PRN tylenol administered.  Will continue to monitor pt closely.  Call bell within reach and family at bedside.

## 2013-09-29 NOTE — Progress Notes (Addendum)
      [address]       [name] [address]             [phone]      Subjective:   Ms. Lagorio is feeling much better overall.  She does have a constant cough and its making her nauseated.  She is ambulating with minimal assistance.  Objective: Vital signs in last 24 hours: Temp:  [99.4 F (37.4 C)-99.8 F (37.7 C)] 99.7 F (37.6 C) (02/15 0405) Pulse Rate:  [96-117] 96 (02/15 0405) Cardiac Rhythm:  [-] Sinus tachycardia (02/14 2017) Resp:  [20-22] 21 (02/15 0405) BP: (107-135)/(35-81) 125/78 mmHg (02/15 0405) SpO2:  [97 %-100 %] 97 % (02/15 0405) Weight:  [257 lb 4.8 oz (116.711 kg)] 257 lb 4.8 oz (116.711 kg) (02/15 0405)  Intake/Output from previous day: 02/14 0701 - 02/15 0700 In: 900 [P.O.:700; IV Piggyback:200] Out: 200 [Urine:200]  General appearance: alert, cooperative and no distress Heart: regular rate and rhythm Lungs: clear to auscultation bilaterally Abdomen: soft, non-tender; bowel sounds normal; no masses,  no organomegaly Extremities: edema trace Wound: clean and dry  Lab Results:  Recent Labs  09/28/13 0245 09/29/13 0535  WBC 12.8* 11.2*  HGB 9.3* 8.3*  HCT 29.3* 26.4*  PLT 453* 521*   BMET:  Recent Labs  09/28/13 0245 09/29/13 0535  NA 138 139  K 4.0 4.0  CL 101 100  CO2 23 24  GLUCOSE 90 94  BUN 11 9  CREATININE 0.84 0.96  CALCIUM 8.6 8.8    PT/INR:  Recent Labs  09/29/13 0535  LABPROT 18.2*  INR 1.55*   ABG    Component Value Date/Time   PHART 7.367 09/20/2013 0425   HCO3 19.9* 09/20/2013 0425   TCO2 24 09/27/2013 0013   ACIDBASEDEF 5.0* 09/20/2013 0425   O2SAT 93.0 09/20/2013 0425   CBG (last 3)  No results found for this basename: GLUCAP,  in the last 72 hours  Assessment/Plan:  1. CV- NSR good rate and pressure control- on Lopressor, Lisinopril 2. Pulm- no acute issue, encouraged is, cough will add Tessalon  3. Renal- creatinine stable, BNP mildly elevated at 541, weight is below admission, not on  diuretic 4. ID- remains mildly febrile, no leukocytosis, blood CX are negative, UA from 2/12 was positive, however culture not performed, will order urine culture, surgical wounds are clean and dry 5. INR 1.55 trending upward, continue coumadin at 7.5 6. Dispo- patient is stable, will order urine culture, continue current care   LOS: 3 days    BARRETT, ERIN 09/29/2013  Patient looks completely fine-maximum temp 99.0 We'll continue IV antibiotics and probably sent home on oral antibiotics for presumed UTI in a.m. I  patient examined and medical record reviewed,agree with above note. VAN TRIGT III,PETER 09/29/2013

## 2013-09-30 DIAGNOSIS — I509 Heart failure, unspecified: Secondary | ICD-10-CM

## 2013-09-30 DIAGNOSIS — Z9889 Other specified postprocedural states: Secondary | ICD-10-CM

## 2013-09-30 DIAGNOSIS — I428 Other cardiomyopathies: Secondary | ICD-10-CM

## 2013-09-30 DIAGNOSIS — Z6841 Body Mass Index (BMI) 40.0 and over, adult: Secondary | ICD-10-CM

## 2013-09-30 DIAGNOSIS — I5042 Chronic combined systolic (congestive) and diastolic (congestive) heart failure: Secondary | ICD-10-CM

## 2013-09-30 DIAGNOSIS — D509 Iron deficiency anemia, unspecified: Secondary | ICD-10-CM

## 2013-09-30 LAB — URINE CULTURE
COLONY COUNT: NO GROWTH
CULTURE: NO GROWTH

## 2013-09-30 LAB — BODY FLUID CULTURE: CULTURE: NO GROWTH

## 2013-09-30 LAB — CULTURE, RESPIRATORY
CULTURE: NORMAL
GRAM STAIN: NONE SEEN

## 2013-09-30 LAB — PROTIME-INR
INR: 1.96 — ABNORMAL HIGH (ref 0.00–1.49)
Prothrombin Time: 21.7 seconds — ABNORMAL HIGH (ref 11.6–15.2)

## 2013-09-30 MED ORDER — LOSARTAN POTASSIUM 50 MG PO TABS
50.0000 mg | ORAL_TABLET | Freq: Every day | ORAL | Status: DC
  Administered 2013-09-30 – 2013-10-01 (×2): 50 mg via ORAL
  Filled 2013-09-30 (×2): qty 1

## 2013-09-30 MED ORDER — CIPROFLOXACIN HCL 500 MG PO TABS
500.0000 mg | ORAL_TABLET | Freq: Two times a day (BID) | ORAL | Status: DC
  Administered 2013-09-30 – 2013-10-01 (×3): 500 mg via ORAL
  Filled 2013-09-30 (×5): qty 1

## 2013-09-30 MED ORDER — LOSARTAN POTASSIUM 25 MG PO TABS
25.0000 mg | ORAL_TABLET | Freq: Every day | ORAL | Status: DC
  Filled 2013-09-30: qty 1

## 2013-09-30 MED ORDER — LOSARTAN POTASSIUM 25 MG PO TABS
25.0000 mg | ORAL_TABLET | Freq: Once | ORAL | Status: AC
  Filled 2013-09-30: qty 1

## 2013-09-30 MED ORDER — WARFARIN SODIUM 5 MG PO TABS
5.0000 mg | ORAL_TABLET | Freq: Every day | ORAL | Status: DC
  Administered 2013-09-30: 5 mg via ORAL
  Filled 2013-09-30 (×2): qty 1

## 2013-09-30 NOTE — Care Management Note (Unsigned)
    Page 1 of 1   09/30/2013     2:01:50 PM   CARE MANAGEMENT NOTE 09/30/2013  Patient:  Paula Wolfe, Paula Wolfe   Account Number:  [id number]  Date Initiated:  09/30/2013  Documentation initiated by:  Broxton Broady  Subjective/Objective Assessment:   PT ADM ON 09/26/13 WITH FEVER, CHILLS, NEAR SYNCOPE.  PTA, PT INDEPENDENT, LIVES WITH MOTHER, BF.     Action/Plan:   WILL FOLLOW FOR DC NEEDS AS PT PROGRESSES.   Anticipated DC Date:  10/01/2013   Anticipated DC Plan:  HOME/SELF CARE      DC Planning Services  CM consult      Choice offered to / List presented to:             Status of service:  In process, will continue to follow Medicare Important Message given?   (If response is "NO", the following Medicare IM given date fields will be blank) Date Medicare IM given:   Date Additional Medicare IM given:    Discharge Disposition:    Per UR Regulation:  Reviewed for med. necessity/level of care/duration of stay  If discussed at Long Length of Stay Meetings, dates discussed:    Comments:

## 2013-09-30 NOTE — Progress Notes (Signed)
PT Cancellation Note  Patient Details Name: Paula Wolfe MRN: [id number] DOB: 1986/07/17   Cancelled Treatment:    Reason Eval/Treat Not Completed: Patient declined, no reason specified 09/30/2013  Olanta Bing, PT [phone]  (pager)  Macee Venables, Eliseo Gum 09/30/2013, 5:16 PM

## 2013-09-30 NOTE — Progress Notes (Signed)
Patient Name: Paula Wolfe Date of Encounter: 09/30/2013  Active Problems:   SOB (shortness of breath)   Anemia, iron deficiency   S/P minimally invasive mitral valve repair   Non-ischemic cardiomyopathy   Near syncope   Acute respiratory failure   PNA (pneumonia)   UTI (urinary tract infection)   Length of Stay: 4  SUBJECTIVE  Substantial improvement over the weekend. Afebrile. Walked >500 ft without problems. All leads/tubes are out.  CURRENT MEDS . ciprofloxacin  500 mg Oral BID  . docusate sodium  200 mg Oral Daily  . ferrous sulfate  325 mg Oral Q breakfast  . folic acid  1 mg Oral Daily  . guaiFENesin  1,200 mg Oral BID  . levothyroxine  75 mcg Oral QAC breakfast  . losartan  25 mg Oral Daily  . metoprolol  50 mg Oral BID  . pantoprazole  40 mg Oral QAC breakfast  . sodium chloride  3 mL Intravenous Q12H  . warfarin  5 mg Oral q1800  . Warfarin - Physician Dosing Inpatient   Does not apply q1800    OBJECTIVE   Intake/Output Summary (Last 24 hours) at 09/30/13 1112 Last data filed at 09/30/13 0426  Gross per 24 hour  Intake    240 ml  Output      2 ml  Net    238 ml   Filed Weights   09/28/13 0500 09/29/13 0405 09/30/13 0413  Weight: 117 kg (257 lb 15 oz) 116.711 kg (257 lb 4.8 oz) 115.667 kg (255 lb)    PHYSICAL EXAM Filed Vitals:   09/29/13 1338 09/29/13 2006 09/30/13 0230 09/30/13 0413  BP: 117/51 119/82  156/90  Pulse: 102 122  105  Temp: 99.1 F (37.3 C) 102.1 F (38.9 C) 99.2 F (37.3 C) 98.8 F (37.1 C)  TempSrc: Oral Oral Oral Oral  Resp: 19 18  18   Height:      Weight:    115.667 kg (255 lb)  SpO2: 100% 99%  99%   General: Alert, oriented x3, no distress Head: no evidence of trauma, PERRL, EOMI, no exophtalmos or lid lag, no myxedema, no xanthelasma; normal ears, nose and oropharynx Neck: normal jugular venous pulsations and no hepatojugular reflux; brisk carotid pulses without delay and no carotid bruits Chest: reduced breath  sounds at bases R>L, no signs of consolidation by percussion or palpation, normal fremitus, symmetrical and full respiratory excursions Cardiovascular: normal position and quality of the apical impulse, regular rhythm, normal first and second heart sounds, no rubs or gallops, no murmur Abdomen: no tenderness or distention, no masses by palpation, no abnormal pulsatility or arterial bruits, normal bowel sounds, no hepatosplenomegaly Extremities: no clubbing, cyanosis or edema; 2+ radial, ulnar and brachial pulses bilaterally; 2+ right femoral, posterior tibial and dorsalis pedis pulses; 2+ left femoral, posterior tibial and dorsalis pedis pulses; no subclavian or femoral bruits Neurological: grossly nonfocal  LABS  CBC  Recent Labs  09/28/13 0245 09/29/13 0535  WBC 12.8* 11.2*  HGB 9.3* 8.3*  HCT 29.3* 26.4*  MCV 78.3 77.4*  PLT 453* 521*   Basic Metabolic Panel  Recent Labs  09/28/13 0245 09/29/13 0535  NA 138 139  K 4.0 4.0  CL 101 100  CO2 23 24  GLUCOSE 90 94  BUN 11 9  CREATININE 0.84 0.96  CALCIUM 8.6 8.8   Liver Function Tests  Recent Labs  09/29/13 0535  AST 61*  ALT 165*  ALKPHOS 125*  BILITOT 0.9  PROT  7.2  ALBUMIN 2.8*   No results found for this basename: LIPASE, AMYLASE,  in the last 72 hours Cardiac Enzymes No results found for this basename: CKTOTAL, CKMB, CKMBINDEX, TROPONINI,  in the last 72 hours  Radiology Studies Imaging results have been reviewed and Dg Chest 2 View  09/29/2013   CLINICAL DATA:  28 year old female status post cardiac valve replacement. Initial encounter.  EXAM: CHEST  2 VIEW  COMPARISON:  09/28/2013 and earlier.  FINDINGS: Stable lung volumes. Continued streaky in confluent opacity in the right lung. No pneumothorax or edema. Persistent moderate right pleural effusion. Negative left lung. Stable cardiac size and mediastinal contours. Visualized tracheal air column is within normal limits. Stable visualized osseous structures.   IMPRESSION: Stable chest with right pleural effusion and combined right lung atelectasis and space opacity.   Electronically Signed   By: Augusto GambleLee  Hall M.D.   On: 09/29/2013 05:56    TELE No AF, sinus tachycardia  ECHO reviewed  ASSESSMENT AND PLAN Close to DC. No clinical CHF. Encourage incentive spirometry. INR almost 2. BP higher - increase ARB Tachycardia related to anemia - suggest repeat echo in 3-6 weeks to assess valve gradient "signature" under normal hemodynamic conditions.  Thurmon FairMihai Maher Shon, MD, [facility] [phone] office [phone] pager 09/30/2013 11:12 AM

## 2013-09-30 NOTE — Progress Notes (Addendum)
[address]       [place] [id number]             [phone]     CARDIOTHORACIC SURGERY PROGRESS NOTE   Subjective: Pt states she is feeling better today and her only complaint is a persistent cough  Objective: Vital signs in last 24 hours: Temp:  [98.8 F (37.1 C)-102.1 F (38.9 C)] 98.8 F (37.1 C) (02/16 0413) Pulse Rate:  [102-122] 105 (02/16 0413) Cardiac Rhythm:  [-] Sinus tachycardia (02/15 2013) Resp:  [18-19] 18 (02/16 0413) BP: (117-156)/(51-90) 156/90 mmHg (02/16 0413) SpO2:  [99 %-100 %] 99 % (02/16 0413) Weight:  [115.667 kg (255 lb)] 115.667 kg (255 lb) (02/16 0413)  Physical Exam:  Rhythm:   Sinus tachycardia  Breath sounds: Clear to auscultation  Heart sounds:  RRR, no murmurs, rubs, or gallops  Incisions:  Clean and dry no indication of infection  Abdomen:  Soft, nontender, nondistended  Extremities:  Warm and well perfused   Intake/Output from previous day: 02/15 0701 - 02/16 0700 In: 240 [P.O.:240] Out: 2 [Urine:2] Intake/Output this shift:    Lab Results:  Recent Labs  09/28/13 0245 09/29/13 0535  WBC 12.8* 11.2*  HGB 9.3* 8.3*  HCT 29.3* 26.4*  PLT 453* 521*   BMET:  Recent Labs  09/28/13 0245 09/29/13 0535  NA 138 139  K 4.0 4.0  CL 101 100  CO2 23 24  GLUCOSE 90 94  BUN 11 9  CREATININE 0.84 0.96  CALCIUM 8.6 8.8    CBG (last 3)  No results found for this basename: GLUCAP,  in the last 72 hours PT/INR:   Recent Labs  09/30/13 0515  LABPROT 21.7*  INR 1.96*    CXR:    CLINICAL DATA: 28 year old female status post cardiac valve  replacement. Initial encounter.  EXAM:  CHEST 2 VIEW  COMPARISON: 09/28/2013 and earlier.  FINDINGS:  Stable lung volumes. Continued streaky in confluent opacity in the  right lung. No pneumothorax or edema. Persistent moderate right  pleural effusion. Negative left lung. Stable cardiac size and  mediastinal contours. Visualized tracheal air column is within    normal limits. Stable visualized osseous structures.  IMPRESSION:  Stable chest with right pleural effusion and combined right lung  atelectasis and space opacity.  Electronically Signed  By: Augusto GambleLee Hall M.D.  On: 09/29/2013 05:56  US Thoracentesis   CLINICAL DATA: Right pleural effusion ; shortness of breath; post  mitral valve surgery  EXAM:  ULTRASOUND GUIDED right THORACENTESIS  COMPARISON: None  FINDINGS:  A total of approximately 80 cc of bloody fluid was removed. A fluid  sample wassent for laboratory analysis.  IMPRESSION:  Successful ultrasound guided right thoracentesis yielding 80 cc of  pleural fluid.  Read by: Beckey DowningPam Turpin PAC  PROCEDURE:  An ultrasound guided thoracentesis was thoroughly discussed with the  patient and questions answered. The benefits, risks, alternatives  and complications were also discussed. The patient understands and  wishes to proceed with the procedure. Written consent was obtained.  Ultrasound was performed to localize and mark an adequate pocket of  fluid in the right chest. The area was then prepped and draped in  the normal sterile fashion. 1% Lidocaine was used for local  anesthesia. Under ultrasound guidance a 19 gauge Yueh catheter was  introduced. Thoracentesis was performed. The catheter was removed  and a dressing applied.  Complications: None  Electronically Signed  By: Meriel PicaAdam Henn M.D.  On: 09/27/2013 12:16     Assessment/Plan: 1. CV-last BP 156/90, on lisinopril 5 mg daily and lopressor 50 mg BID. Continue to watch trend 2. Pulmonary-stable right pleural effusion-US Thoracentesis report above  3. Iron deficiency anemia-8.3 & 26.4, ferrous sulfate 325 mg q breakfast 4. Possible UTI-urine culture is pending, on Cipro 500 mg BID 5. Weight below baseline, elevated BNP, not currently on a diuretic 6. Doppler study negative, no DVT-Anticoagulation, INR 1.96 continue warfarin 5 mg q 1800 7. D/C tomorrow if progressing appropriately      Jari Favre, PA-S 09/30/2013 8:37 AM  I have seen and examined the patient and agree with the assessment and plan as outlined.  Will change ACE-I to ARB because of the possibility that this could be contributing to cough.  Change IV antibiotics to oral Cipro.  Adjust coumadin dose down because of possible interaction with cipro.  Talani Brazee H 09/30/2013 9:58 AM

## 2013-09-30 NOTE — Progress Notes (Addendum)
      [address]       [name] [address]             [phone]             Subjective: Patient without complaints and is feeling better.  Objective: Vital signs in last 24 hours: Temp:  [98.8 F (37.1 C)-102.1 F (38.9 C)] 98.8 F (37.1 C) (02/16 0413) Pulse Rate:  [102-122] 105 (02/16 0413) Cardiac Rhythm:  [-] Sinus tachycardia (02/15 2013) Resp:  [18-19] 18 (02/16 0413) BP: (117-156)/(51-90) 156/90 mmHg (02/16 0413) SpO2:  [99 %-100 %] 99 % (02/16 0413) Weight:  [115.667 kg (255 lb)] 115.667 kg (255 lb) (02/16 0413)   Current Weight  09/30/13 115.667 kg (255 lb)      Intake/Output from previous day: 02/15 0701 - 02/16 0700 In: 240 [P.O.:240] Out: 2 [Urine:2]   Physical Exam:  Cardiovascular: RRR, no murmurs, gallops, or rubs. Pulmonary: Slightly diminished at bases; no rales, wheezes, or rhonchi. Abdomen: Soft, non tender, bowel sounds present. Extremities: Trace bilateral lower extremity edema. Wounds: Clean and dry.  No erythema or signs of infection.  Lab Results: CBC: Recent Labs  09/28/13 0245 09/29/13 0535  WBC 12.8* 11.2*  HGB 9.3* 8.3*  HCT 29.3* 26.4*  PLT 453* 521*   BMET:  Recent Labs  09/28/13 0245 09/29/13 0535  NA 138 139  K 4.0 4.0  CL 101 100  CO2 23 24  GLUCOSE 90 94  BUN 11 9  CREATININE 0.84 0.96  CALCIUM 8.6 8.8    PT/INR:  Lab Results  Component Value Date   INR 1.96* 09/30/2013   INR 1.55* 09/29/2013   INR 1.37 09/28/2013   ABG:  INR: Will add last result for INR, ABG once components are confirmed Will add last 4 CBG results once components are confirmed  Assessment/Plan:  1. CV - ST in the 100's. On Lopressor 50 bid, Lisinopril 5 daily, and Coumadin 7.5 daily. INR increased from 1.55 to 1.96. Will decrease coumadin to 5 daily as will be put on Cipro. 2.  Pulmonary - Encourage incentive spirometer 3. [place] LE showed no DVT of bilateral lower extremities 4.  Acute blood loss anemia - H  and H yesterday 8.3 and 26.4 5.ID-remains afebrile. Last WBC 11,200. On Vanco and Keflex. UA negative for leukocytes.Await culture results. Sputum cultures showed no WBC and rare gram positive cocci. Will change antibiotics to Cipro, as discussed with Dr. Cornelius Moras. 6.BNP from 2/14 down to 514.5. Not on diuretic. Will discuss with surgeon.  ZIMMERMAN,DONIELLE MPA-C 09/30/2013,7:33 AM  I have seen and examined the patient and agree with the assessment and plan as outlined.  OWEN,CLARENCE H 09/30/2013 10:02 AM

## 2013-09-30 NOTE — Progress Notes (Signed)
CARDIAC REHAB PHASE I   PRE:  Rate/Rhythm: 124 ST  BP:  Supine:   Sitting: 140/76  Standing:    SaO2: 99%RA  MODE:  Ambulation: 550 ft   POST:  Rate/Rhythm: 133 ST  BP:  Supine:   Sitting: 140/78  Standing:    SaO2: 100%RA 0835-0900 Pt walked 550 ft on RA with steady gait. Tolerated well. Slightly SOB. Sats good on RA. Stopped once to rest. Heart rate elevated. No complaints.   Luetta Nutting, RN BSN  09/30/2013 8:57 AM

## 2013-10-01 ENCOUNTER — Inpatient Hospital Stay (HOSPITAL_COMMUNITY): Payer: BC Managed Care – PPO

## 2013-10-01 LAB — PROTIME-INR
INR: 2.27 — ABNORMAL HIGH (ref 0.00–1.49)
Prothrombin Time: 24.3 seconds — ABNORMAL HIGH (ref 11.6–15.2)

## 2013-10-01 LAB — CBC
HEMATOCRIT: 25.1 % — AB (ref 36.0–46.0)
Hemoglobin: 7.9 g/dL — ABNORMAL LOW (ref 12.0–15.0)
MCH: 24.5 pg — ABNORMAL LOW (ref 26.0–34.0)
MCHC: 31.5 g/dL (ref 30.0–36.0)
MCV: 78 fL (ref 78.0–100.0)
Platelets: 526 10*3/uL — ABNORMAL HIGH (ref 150–400)
RBC: 3.22 MIL/uL — ABNORMAL LOW (ref 3.87–5.11)
RDW: 21.1 % — AB (ref 11.5–15.5)
WBC: 11.4 10*3/uL — ABNORMAL HIGH (ref 4.0–10.5)

## 2013-10-01 MED ORDER — LOSARTAN POTASSIUM 50 MG PO TABS: 50.0000 mg | ORAL_TABLET | Freq: Every day | ORAL | Status: DC

## 2013-10-01 MED ORDER — CIPROFLOXACIN HCL 500 MG PO TABS: 500.0000 mg | ORAL_TABLET | Freq: Two times a day (BID) | ORAL | Status: DC

## 2013-10-01 MED ORDER — GUAIFENESIN ER 600 MG PO TB12: 1200.0000 mg | ORAL_TABLET | Freq: Two times a day (BID) | ORAL | Status: DC | PRN

## 2013-10-01 NOTE — Progress Notes (Signed)
Patient Name: Jarrett AblesShatina Renee Jalomo Date of Encounter: 10/01/2013  Active Problems:   SOB (shortness of breath)   Anemia, iron deficiency   S/P minimally invasive mitral valve repair   Non-ischemic cardiomyopathy   Near syncope   Acute respiratory failure   PNA (pneumonia)   UTI (urinary tract infection)   Length of Stay: 5  SUBJECTIVE  Feels great, eager to go home. Dry cough.  CURRENT MEDS . ciprofloxacin  500 mg Oral BID  . docusate sodium  200 mg Oral Daily  . ferrous sulfate  325 mg Oral Q breakfast  . folic acid  1 mg Oral Daily  . guaiFENesin  1,200 mg Oral BID  . levothyroxine  75 mcg Oral QAC breakfast  . losartan  50 mg Oral Daily  . metoprolol  50 mg Oral BID  . pantoprazole  40 mg Oral QAC breakfast  . sodium chloride  3 mL Intravenous Q12H  . warfarin  5 mg Oral q1800  . Warfarin - Physician Dosing Inpatient   Does not apply q1800    OBJECTIVE   Intake/Output Summary (Last 24 hours) at 10/01/13 0915 Last data filed at 09/30/13 2100  Gross per 24 hour  Intake    480 ml  Output      0 ml  Net    480 ml   Filed Weights   09/29/13 0405 09/30/13 0413 10/01/13 0645  Weight: 116.711 kg (257 lb 4.8 oz) 115.667 kg (255 lb) 115.7 kg (255 lb 1.2 oz)    PHYSICAL EXAM Filed Vitals:   09/30/13 1315 09/30/13 1337 09/30/13 2033 10/01/13 0645  BP: 131/70 109/76 108/78 120/82  Pulse:  100 116 108  Temp: 99.1 F (37.3 C) 100.3 F (37.9 C) 98.9 F (37.2 C) 99.1 F (37.3 C)  TempSrc: Oral Oral Oral Oral  Resp:  18 18 18   Height:      Weight:    115.7 kg (255 lb 1.2 oz)  SpO2:  99% 100% 94%   General: Alert, oriented x3, no distress Head: no evidence of trauma, PERRL, EOMI, no exophtalmos or lid lag, no myxedema, no xanthelasma; normal ears, nose and oropharynx Neck: normal jugular venous pulsations and no hepatojugular reflux; brisk carotid pulses without delay and no carotid bruits Chest: diminished breath sounds at bases, no signs of consolidation by  percussion or palpation, normal fremitus, symmetrical and full respiratory excursions Cardiovascular: normal position and quality of the apical impulse, regular rhythm, normal first and second heart sounds, no rubs or gallops, no murmur Abdomen: no tenderness or distention, no masses by palpation, no abnormal pulsatility or arterial bruits, normal bowel sounds, no hepatosplenomegaly Extremities: no clubbing, cyanosis or edema; 2+ radial, ulnar and brachial pulses bilaterally; 2+ right femoral, posterior tibial and dorsalis pedis pulses; 2+ left femoral, posterior tibial and dorsalis pedis pulses; no subclavian or femoral bruits Neurological: grossly nonfocal  LABS  CBC  Recent Labs  09/29/13 0535 10/01/13 0350  WBC 11.2* 11.4*  HGB 8.3* 7.9*  HCT 26.4* 25.1*  MCV 77.4* 78.0  PLT 521* 526*   Basic Metabolic Panel  Recent Labs  09/29/13 0535  NA 139  K 4.0  CL 100  CO2 24  GLUCOSE 94  BUN 9  CREATININE 0.96  CALCIUM 8.8   Liver Function Tests  Recent Labs  09/29/13 0535  AST 61*  ALT 165*  ALKPHOS 125*  BILITOT 0.9  PROT 7.2  ALBUMIN 2.8*    Radiology Studies Dg Chest 2 View  10/01/2013  CLINICAL DATA:  Atelectatic change; cough  EXAM: CHEST  2 VIEW  COMPARISON:  September 29, 2013  FINDINGS: There is stable atelectatic change on the right. There is a stable small right effusion is well. Left lung is clear. There is no airspace consolidation. There is no new opacity. Heart is upper normal in size with pulmonary vascularity within normal limits. No adenopathy apparent. There is postoperative change on the right. There is no apparent pneumothorax.  IMPRESSION: Stable right lung atelectatic change in effusion. No new opacity. Left lung remains clear.   Electronically Signed   By: Bretta Bang M.D.   On: 10/01/2013 07:54    TELE Sinus tachy  ASSESSMENT AND PLAN INR therapeutic For DC home today. Weight is close to admission weight. Mild tachycardia probably  anemia related, but she also has depressed LVEF. She was on furosemide preop and it is probably a good idea to continue as outpatient. Losartan dose increased to 50mg  yesterday.  Thurmon Fair, MD, [facility] [phone] office [phone] pager 10/01/2013 9:15 AM

## 2013-10-01 NOTE — Progress Notes (Signed)
Went over discharge instructions with patient. No additional questions or concerns related to discharge instructions. IV d/c'd, patient taken off cardiac monitor. Patient discharged home. Stanton Kidney R

## 2013-10-01 NOTE — Progress Notes (Signed)
[id number] Pt with low EF so I gave CHF booklet and reviewed zones of when to call MD. Encouraged daily weights and discussed importance of doing them daily and keeping sodium to 2000mg . Gave low sodium handouts. Pt still declining CRP 2. Encouraged IS and walking. Luetta Nutting RN BSN 10/01/2013 10:21 AM

## 2013-10-01 NOTE — Discharge Instructions (Signed)
Please weigh yourself daily. If you develop lower extremity swelling, shortness of breath, or have a progressive increase in your weight, call office asap.   Wound Care: May shower.  Clean wounds with mild soap and water daily. Contact the office at [phone] if any problems arise.

## 2013-10-01 NOTE — Progress Notes (Addendum)
      [address]       [name] [address]             [phone]             Subjective: Patient had a coughing spell last night. Overall, she feels better and hopes to go home.  Objective: Vital signs in last 24 hours: Temp:  [98.9 F (37.2 C)-100.3 F (37.9 C)] 99.1 F (37.3 C) (02/17 0645) Pulse Rate:  [100-116] 108 (02/17 0645) Cardiac Rhythm:  [-] Sinus tachycardia (02/16 2030) Resp:  [18] 18 (02/17 0645) BP: (108-131)/(70-82) 120/82 mmHg (02/17 0645) SpO2:  [94 %-100 %] 94 % (02/17 0645) Weight:  [115.7 kg (255 lb 1.2 oz)] 115.7 kg (255 lb 1.2 oz) (02/17 0645)   Current Weight  10/01/13 115.7 kg (255 lb 1.2 oz)     Intake/Output from previous day: 02/16 0701 - 02/17 0700 In: 720 [P.O.:720] Out: -    Physical Exam:  Cardiovascular: RRR, no murmurs, gallops, or rubs. Pulmonary: Diminished at right base; no rales, wheezes, or rhonchi. Abdomen: Soft, non tender, bowel sounds present. Extremities: Trace bilateral lower extremity edema. Wounds: Clean and dry.  No erythema or signs of infection.  Lab Results: CBC:  Recent Labs  09/29/13 0535 10/01/13 0350  WBC 11.2* 11.4*  HGB 8.3* 7.9*  HCT 26.4* 25.1*  PLT 521* 526*   BMET:   Recent Labs  09/29/13 0535  NA 139  K 4.0  CL 100  CO2 24  GLUCOSE 94  BUN 9  CREATININE 0.96  CALCIUM 8.8    PT/INR:  Lab Results  Component Value Date   INR 2.27* 10/01/2013   INR 1.96* 09/30/2013   INR 1.55* 09/29/2013   ABG:  INR: Will add last result for INR, ABG once components are confirmed Will add last 4 CBG results once components are confirmed  Assessment/Plan:  1. CV - ST in the 100's. On Lopressor 50 bid, Cozaar 50 daily, and Coumadin 5 daily. INR increased from 1.96 to 2.27.  2.  Pulmonary - CXR appears stable (right pleural effusion and atelectasis). Encourage incentive spirometer 3.  Acute blood loss anemia - H and H 7.9 and 25.1 4.ID-low grade 99 temp this am. Last WBC  11,400.UA negative for leukocytes. UC was negative.Sputum cultures showed no WBC and rare gram positive cocci.Continue  Cipro. 5.BNP from 2/14 down to 514.5. Weight is down.Not on diuretic. Will discuss with surgeon.  ZIMMERMAN,DONIELLE MPA-C 10/01/2013,7:31 AM    I have seen and examined the patient and agree with the assessment and plan as outlined.  D/C home today.  Will not resume diuretic but I have instructed patient to record her weight daily and call if she develops swelling, increased SOB or progressively increased weight that might suggest the development of CHF.  Continue Cipro x 7 days.   Raveen Wieseler H 10/01/2013 8:10 AM

## 2013-10-01 NOTE — Discharge Summary (Signed)
I agree with the above discharge summary and plan for follow-up.  Doni Widmer H  

## 2013-10-02 ENCOUNTER — Other Ambulatory Visit: Payer: Self-pay | Admitting: *Deleted

## 2013-10-02 DIAGNOSIS — Z9889 Other specified postprocedural states: Secondary | ICD-10-CM

## 2013-10-03 ENCOUNTER — Ambulatory Visit (INDEPENDENT_AMBULATORY_CARE_PROVIDER_SITE_OTHER): Payer: BC Managed Care – PPO | Admitting: Pharmacist

## 2013-10-03 DIAGNOSIS — Z9889 Other specified postprocedural states: Secondary | ICD-10-CM

## 2013-10-03 DIAGNOSIS — Z5181 Encounter for therapeutic drug level monitoring: Secondary | ICD-10-CM | POA: Insufficient documentation

## 2013-10-03 LAB — CULTURE, BLOOD (ROUTINE X 2)
CULTURE: NO GROWTH
Culture: NO GROWTH

## 2013-10-03 LAB — POCT INR: INR: 2.5

## 2013-10-03 LAB — CULTURE, BLOOD (SINGLE): Culture: NO GROWTH

## 2013-10-06 LAB — CULTURE, BLOOD (ROUTINE X 2)
Culture: NO GROWTH
Culture: NO GROWTH

## 2013-10-07 ENCOUNTER — Ambulatory Visit
Admission: RE | Admit: 2013-10-07 | Discharge: 2013-10-07 | Disposition: A | Discharge status: Home / Self Care | Payer: BC Managed Care – PPO | Source: Ambulatory Visit | Attending: Thoracic Surgery (Cardiothoracic Vascular Surgery) | Admitting: Thoracic Surgery (Cardiothoracic Vascular Surgery)

## 2013-10-07 ENCOUNTER — Ambulatory Visit (INDEPENDENT_AMBULATORY_CARE_PROVIDER_SITE_OTHER): Payer: Self-pay | Admitting: Thoracic Surgery (Cardiothoracic Vascular Surgery)

## 2013-10-07 ENCOUNTER — Encounter: Payer: Self-pay | Admitting: Thoracic Surgery (Cardiothoracic Vascular Surgery)

## 2013-10-07 VITALS — BP 128/80 | HR 83 | Resp 16 | Ht 62.0 in | Wt 250.0 lb

## 2013-10-07 DIAGNOSIS — Z9889 Other specified postprocedural states: Secondary | ICD-10-CM

## 2013-10-07 DIAGNOSIS — I059 Rheumatic mitral valve disease, unspecified: Secondary | ICD-10-CM

## 2013-10-07 DIAGNOSIS — I340 Nonrheumatic mitral (valve) insufficiency: Secondary | ICD-10-CM

## 2013-10-07 NOTE — Progress Notes (Signed)
[address]       [place] [id number]             [phone]     CARDIOTHORACIC SURGERY OFFICE NOTE  Referring Provider is Paula NoeSmith, [name], MD PCP is Paula SepLOKESH, [name], MD   HPI:  Patient returns for routine followup status post minimally invasive mitral valve repair on 09/19/2013. Her initial postoperative course was notable for acute exacerbation of chronic iron deficient anemia requiring transfusion of 2 units packed red blood cells. She was discharged from the hospital 09/26/2013 but subsequently readmitted the following day with fever and near syncopal episode. Urinalysis suggested the possibility of urinary tract infection although urine culture was not sent until after antibiotics were started. The patient was treated with empiric course of antibiotics which were initially given intravenously and then switched to oral ciprofloxacin.  She was noted to have significant residual atelectasis in the right lung base with a small right pleural effusion. She underwent ultrasound-guided thoracentesis yielding a small volume of serous sanguinous fluid. All cultures obtained during her hospitalization were negative including blood cultures, sputum cultures, and urine culture. She clinically improved and was again discharged from the hospital 10/01/2013. Since then she has done well. She has had her prothrombin time checked on one occasion at which time she was therapeutic on coumadin with INR 2.5.  She has not yet been seen in followup by her cardiologist. She has mild residual soreness in her right chest and breast. This bothers her primarily at night and she has been using pain medication at night to help her sleep. She has some exertional shortness of breath which continues to gradually improve. Her appetite is good. She has not had any fevers or chills. Her cough has nearly completely resolved.   Current Outpatient Prescriptions  Medication Sig Dispense Refill  . aspirin  EC 81 MG EC tablet Take 1 tablet (81 mg total) by mouth daily.      . Ferrous Sulfate (FER-IRON PO) Take 80 mg by mouth 2 (two) times daily.      . folic acid (FOLVITE) 1 MG tablet Take 1 tablet (1 mg total) by mouth daily.  30 tablet  1  . guaiFENesin (MUCINEX) 600 MG 12 hr tablet Take 2 tablets (1,200 mg total) by mouth 2 (two) times daily as needed. For cough      . levothyroxine (SYNTHROID, LEVOTHROID) 75 MCG tablet Take 75 mcg by mouth daily before breakfast.      . losartan (COZAAR) 50 MG tablet Take 1 tablet (50 mg total) by mouth daily.  30 tablet  1  . metoprolol (LOPRESSOR) 50 MG tablet Take 1 tablet (50 mg total) by mouth 2 (two) times daily.  60 tablet  1  . oxyCODONE (OXY IR/ROXICODONE) 5 MG immediate release tablet Take 1-2 tablets (5-10 mg total) by mouth every 4 (four) hours as needed for moderate pain.  50 tablet  0  . warfarin (COUMADIN) 5 MG tablet Take 1 tablet (5 mg total) by mouth daily at 6 PM.  100 tablet  1   No current facility-administered medications for this visit.      Physical Exam:   BP 128/80  Pulse 83  Resp 16  Ht 5\' 2"  (1.575 m)  Wt 250 lb (113.399 kg)  BMI 45.71 kg/m2  SpO2 99%  LMP 09/21/2013  General:  Well-appearing  Chest:   Clear to auscultation with slightly diminished breath sounds right lung base  CV:   Regular rate and rhythm without murmur  Incisions:  Clean and dry and healing nicely  Abdomen:  Soft and nontender  Extremities:  Warm and well-perfused with no lower extremity edema  Diagnostic Tests:  CHEST 2 VIEW  COMPARISON: DG CHEST 2 VIEW dated 10/01/2013; DG CHEST 2 VIEW dated  09/29/2013; CT ANGIO CHEST W/CM &/OR WO/CM dated 09/12/2013; DG  CHEST 2 VIEW dated 09/17/2013; DG CHEST 1V PORT dated 09/19/2013  FINDINGS:  Midline trachea. Midline trachea. Normal heart size. Surgical clips  project over the right hilum on the frontal radiograph. There also  surgical clips in the anterior lateral right chest. Likely secondary  volume loss  in the right hemi thorax with pleural parenchymal  opacity. Patchy areas of atelectasis, likely with slightly improved  aeration. Left lung remains clear without pleural fluid or  pneumothorax.  IMPRESSION:  Similar to minimal improvement in right-sided aeration. Surgical  changes with volume loss and scattered areas of pleural parenchymal  opacity remain.  Electronically Signed  By: [name] M.D.  On: 10/07/2013 08:57    Impression:  Patient is making satisfactory progress status post minimally invasive mitral valve repair.  Plan:  I've instructed the patient to stop taking aspirin now that she is therapeutic on Coumadin. I've encouraged her to gradually increase her physical activity and I specifically suggested that she would benefit from enrolling an outpatient cardiac rehabilitation program. All of her questions been addressed. The patient will return in 4 weeks for routine followup with another chest x-ray.   [name], MD 10/07/2013 9:48 AM

## 2013-10-07 NOTE — Patient Instructions (Signed)
The patient may continue to gradually increase their physical activity as tolerated.  They should refrain from any heavy lifting or strenuous use of their arms and shoulders until at least 8 weeks from the time of their surgery, and they should avoid activities that cause increased pain in their chest on the side of their surgical incision.  Otherwise they may continue to increase their activities without any particular limitations.  Stop taking aspirin.  The patient may return to driving an automobile as long as they are no longer requiring oral narcotic pain relievers during the daytime.  It would be wise to start driving only short distances during the daylight and gradually increase from there as they feel comfortable.

## 2013-10-11 ENCOUNTER — Ambulatory Visit (INDEPENDENT_AMBULATORY_CARE_PROVIDER_SITE_OTHER): Payer: BC Managed Care – PPO | Admitting: Pharmacist

## 2013-10-11 DIAGNOSIS — Z9889 Other specified postprocedural states: Secondary | ICD-10-CM

## 2013-10-11 DIAGNOSIS — Z5181 Encounter for therapeutic drug level monitoring: Secondary | ICD-10-CM

## 2013-10-11 LAB — POCT INR: INR: 2.3

## 2013-10-12 ENCOUNTER — Encounter: Payer: Self-pay | Admitting: Interventional Cardiology

## 2013-10-14 ENCOUNTER — Encounter: Payer: Self-pay | Admitting: *Deleted

## 2013-10-14 NOTE — Progress Notes (Signed)
Patient ID: Paula Wolfe, female   DOB: [birth year], 28 y.o.   MRN: [id number] 10/08/13 Faxed referral to [facility] cardiac rehab requesting contacting the pt for cardiac rehab phase 2 per Dr. Lemont Fillers request.

## 2013-10-16 ENCOUNTER — Ambulatory Visit (INDEPENDENT_AMBULATORY_CARE_PROVIDER_SITE_OTHER): Payer: BC Managed Care – PPO | Admitting: Interventional Cardiology

## 2013-10-16 ENCOUNTER — Encounter: Payer: Self-pay | Admitting: Interventional Cardiology

## 2013-10-16 VITALS — BP 132/85 | HR 83 | Ht 62.0 in | Wt 255.6 lb

## 2013-10-16 DIAGNOSIS — I5042 Chronic combined systolic (congestive) and diastolic (congestive) heart failure: Secondary | ICD-10-CM

## 2013-10-16 DIAGNOSIS — Z9889 Other specified postprocedural states: Secondary | ICD-10-CM

## 2013-10-16 DIAGNOSIS — Z6841 Body Mass Index (BMI) 40.0 and over, adult: Secondary | ICD-10-CM

## 2013-10-16 DIAGNOSIS — I509 Heart failure, unspecified: Secondary | ICD-10-CM

## 2013-10-16 DIAGNOSIS — Z79899 Other long term (current) drug therapy: Secondary | ICD-10-CM | POA: Insufficient documentation

## 2013-10-16 MED ORDER — AMIODARONE HCL 200 MG PO TABS: 200.0000 mg | ORAL_TABLET | Freq: Every day | ORAL | Status: DC

## 2013-10-16 NOTE — Patient Instructions (Addendum)
Your physician has recommended you make the following change in your medication:  1) Take Amiodarone 200 mg daily  Take all other medications as prescribed  You have been referred to  [facility]  You have a follow up appt scheduled for 12/16/13 @3pm 

## 2013-10-16 NOTE — Progress Notes (Signed)
Patient ID: Paula Wolfe, female   DOB: [birth year], 28 y.o.   MRN: [id number]    [address], [address] [place], [place]  [address] Phone: [phone] Fax:  [phone]  Date:  10/16/2013   ID:  Paula Wolfe, DOB [birth year], MRN [id number]  PCP:  Domingo Sep, MD   ASSESSMENT:  1. Status post mitral valve repair 2. Acute diastolic heart failure, resolved 3. Hypertension 4. Amiodarone therapy 5. chronic Coumadin anticoagulation  PLAN:  1. Continue current medical regimen 2. Consider phase II cardiac rehabilitation 3. Clinical followup with me in 6-8 weeks 4. Monitor amiodarone and considered discontinuation in the next 2-3 months 5. Call if chills or fever 6. Decrease amiodarone to 200 mg daily 7. Continue to follow in the Coumadin clinic   SUBJECTIVE: Paula Wolfe is a 28 y.o. female status post mitral valve repair. She presented with combined systolic and diastolic heart failure. She still has some dyspnea on exertion but this is improving. Her operation was performed by Dr. Cornelius Moras and went extremely well. She denies orthopnea, PND, and lower extremity edema. She has had no significant palpitations. There've been no neurological complaints.  She was rehospitalized for UTI with fever approximately one week after initial discharge.. Since that hospitalization she has not noted any chills, fever, lassitude, decrease in appetite, or other systemic complaints.   Wt Readings from Last 3 Encounters:  10/16/13 255 lb 9.6 oz (115.939 kg)  10/07/13 250 lb (113.399 kg)  10/01/13 255 lb 1.2 oz (115.7 kg)     Past Medical History  Diagnosis Date  . Hypothyroidism   . Nontoxic multinodular goiter     a. Pt reports prior ultrasound/bx that were negative but was told she would need to keep an eye on them in the future. Benign FNA per [facility] for this dx.  . Menometrorrhagia     a. Improved after tx of hypothyroidism, but recurred 05/2013.  . Broken  foot     a. Remote broken foot with residual chronic LLE edema.  . Acute on chronic combined systolic and diastolic heart failure 09/12/2013  . Mitral regurgitation 09/12/2013    severe by TEE  . Multinodular goiter   . Pulmonary hypertension 09/13/2013  . Obesity   . Anemia     iron deficient, attributed to menometrorrhagia  . Chronic combined systolic and diastolic CHF (congestive heart failure)   . Morbid obesity with BMI of 45.0-49.9, adult 09/16/2013  . H/O syncope     prior to finding out about the mitral regurgitation  . Shortness of breath     sometimes with exertion  . Headache(784.0)     headaches  . 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 Non-ischemic cardiomyopathy     Current Outpatient Prescriptions  Medication Sig Dispense Refill  . amiodarone (PACERONE) 200 MG tablet Take 200 mg by mouth. 1 and 1/2 pill on Monday Wednesday and Friday      . Ferrous Sulfate (FER-IRON PO) Take 80 mg by mouth 2 (two) times daily.      . folic acid (FOLVITE) 1 MG tablet Take 1 tablet (1 mg total) by mouth daily.  30 tablet  1  . levothyroxine (SYNTHROID) 75 MCG tablet Take 75 mcg by mouth.      . levothyroxine (SYNTHROID, LEVOTHROID) 75 MCG tablet Take 75 mcg by mouth daily before breakfast.      .  losartan (COZAAR) 50 MG tablet Take 1 tablet (50 mg total) by mouth daily.  30 tablet  1  . metoprolol (LOPRESSOR) 50 MG tablet Take 1 tablet (50 mg total) by mouth 2 (two) times daily.  60 tablet  1  . oxyCODONE (OXY IR/ROXICODONE) 5 MG immediate release tablet Take 1-2 tablets (5-10 mg total) by mouth every 4 (four) hours as needed for moderate pain.  50 tablet  0  . warfarin (COUMADIN) 5 MG tablet Take 1 tablet (5 mg total) by mouth daily at 6 PM.  100 tablet  1   No current facility-administered medications for this visit.    Allergies:   No Known Allergies  Social History:  The  patient  reports that she has never smoked. She has never used smokeless tobacco. She reports that she drinks alcohol. She reports that she does not use illicit drugs.   ROS:  Please see the history of present illness.   Denies blood in her urine and stool   All other systems reviewed and negative.   OBJECTIVE: VS:  BP 132/85  Pulse 83  Ht 5\' 2"  (1.575 m)  Wt 255 lb 9.6 oz (115.939 kg)  BMI 46.74 kg/m2  LMP 09/21/2013 Well nourished, well developed, in no acute distress, obese HEENT: normal Neck: JVD flat. Carotid bruit absent  Cardiac:  normal S1, S2; RRR; no murmur. No rub is heard Lungs:  clear to auscultation bilaterally, no wheezing, rhonchi or rales Abd: soft, nontender, no hepatomegaly Ext: Edema none. Pulses 2+ Skin: warm and dry Neuro:  CNs 2-12 intact, no focal abnormalities noted  EKG:  Not repeated       Signed, Darci NeedleHenry W. B. Smith III, MD 10/16/2013 9:58 AM

## 2013-10-24 ENCOUNTER — Other Ambulatory Visit: Payer: Self-pay | Admitting: *Deleted

## 2013-10-24 DIAGNOSIS — I059 Rheumatic mitral valve disease, unspecified: Secondary | ICD-10-CM

## 2013-10-25 ENCOUNTER — Ambulatory Visit (INDEPENDENT_AMBULATORY_CARE_PROVIDER_SITE_OTHER): Payer: BC Managed Care – PPO | Admitting: *Deleted

## 2013-10-25 DIAGNOSIS — Z9889 Other specified postprocedural states: Secondary | ICD-10-CM

## 2013-10-25 DIAGNOSIS — Z5181 Encounter for therapeutic drug level monitoring: Secondary | ICD-10-CM

## 2013-10-25 LAB — POCT INR: INR: 2

## 2013-10-30 ENCOUNTER — Encounter (INDEPENDENT_AMBULATORY_CARE_PROVIDER_SITE_OTHER): Payer: Self-pay

## 2013-10-30 DIAGNOSIS — I2510 Atherosclerotic heart disease of native coronary artery without angina pectoris: Secondary | ICD-10-CM

## 2013-10-31 ENCOUNTER — Encounter (HOSPITAL_COMMUNITY)
Admission: RE | Admit: 2013-10-31 | Discharge: 2013-10-31 | Disposition: A | Discharge status: Home / Self Care | Payer: BC Managed Care – PPO | Source: Ambulatory Visit | Attending: Interventional Cardiology | Admitting: Interventional Cardiology

## 2013-10-31 DIAGNOSIS — Z6841 Body Mass Index (BMI) 40.0 and over, adult: Secondary | ICD-10-CM | POA: Insufficient documentation

## 2013-10-31 DIAGNOSIS — Z5189 Encounter for other specified aftercare: Secondary | ICD-10-CM | POA: Insufficient documentation

## 2013-10-31 DIAGNOSIS — I509 Heart failure, unspecified: Secondary | ICD-10-CM | POA: Insufficient documentation

## 2013-10-31 DIAGNOSIS — I279 Pulmonary heart disease, unspecified: Secondary | ICD-10-CM | POA: Insufficient documentation

## 2013-10-31 DIAGNOSIS — I5042 Chronic combined systolic (congestive) and diastolic (congestive) heart failure: Secondary | ICD-10-CM | POA: Insufficient documentation

## 2013-10-31 NOTE — Progress Notes (Addendum)
Cardiac Rehab Medication Review by a Pharmacist  Does the patient  feel that his/her medications are working for him/her?  yes  Has the patient been experiencing any side effects to the medications prescribed?  no  Does the patient measure his/her own blood pressure or blood glucose at home?  no   Does the patient have any problems obtaining medications due to transportation or finances?   no  Understanding of regimen: good Understanding of indications: good Potential of compliance: good    Pharmacist comments:   Ms. Crye is a 28 yo pleasant AAF who presents today for medication review. I have updated all of her medications and NKA. Patient is followed by [facility] for routine INR checks, currently aware of amiodarone use. Per patient report, she has been taking metoprolol tartrate 50mg  once daily. HR this AM 91. Would consider dosing twice daily if patient has consistently elevated HR. No changes at this time.   Tyrone Nine. Artelia Laroche, PharmD Clinical Pharmacist - Resident Phone: [phone] Pager: [phone] 10/31/2013 8:39 AM

## 2013-11-01 ENCOUNTER — Ambulatory Visit (INDEPENDENT_AMBULATORY_CARE_PROVIDER_SITE_OTHER): Payer: BC Managed Care – PPO | Admitting: *Deleted

## 2013-11-01 DIAGNOSIS — Z5181 Encounter for therapeutic drug level monitoring: Secondary | ICD-10-CM

## 2013-11-01 DIAGNOSIS — Z9889 Other specified postprocedural states: Secondary | ICD-10-CM

## 2013-11-01 LAB — POCT INR: INR: 1.9

## 2013-11-04 ENCOUNTER — Encounter: Payer: Self-pay | Admitting: Thoracic Surgery (Cardiothoracic Vascular Surgery)

## 2013-11-04 ENCOUNTER — Ambulatory Visit (INDEPENDENT_AMBULATORY_CARE_PROVIDER_SITE_OTHER): Payer: Self-pay | Admitting: Thoracic Surgery (Cardiothoracic Vascular Surgery)

## 2013-11-04 ENCOUNTER — Encounter (HOSPITAL_COMMUNITY): Payer: Self-pay

## 2013-11-04 ENCOUNTER — Ambulatory Visit
Admission: RE | Admit: 2013-11-04 | Discharge: 2013-11-04 | Disposition: A | Discharge status: Home / Self Care | Payer: BC Managed Care – PPO | Source: Ambulatory Visit | Attending: Thoracic Surgery (Cardiothoracic Vascular Surgery) | Admitting: Thoracic Surgery (Cardiothoracic Vascular Surgery)

## 2013-11-04 ENCOUNTER — Encounter (HOSPITAL_COMMUNITY)
Admission: RE | Admit: 2013-11-04 | Discharge: 2013-11-04 | Disposition: A | Discharge status: Home / Self Care | Payer: BC Managed Care – PPO | Source: Ambulatory Visit | Attending: Interventional Cardiology | Admitting: Interventional Cardiology

## 2013-11-04 VITALS — BP 140/98 | HR 74 | Resp 20 | Ht 64.0 in | Wt 254.0 lb

## 2013-11-04 DIAGNOSIS — I059 Rheumatic mitral valve disease, unspecified: Secondary | ICD-10-CM

## 2013-11-04 DIAGNOSIS — I340 Nonrheumatic mitral (valve) insufficiency: Secondary | ICD-10-CM

## 2013-11-04 DIAGNOSIS — Z9889 Other specified postprocedural states: Secondary | ICD-10-CM

## 2013-11-04 NOTE — Progress Notes (Signed)
[address]       [place] [id number]             [phone]     CARDIOTHORACIC SURGERY OFFICE NOTE  Referring Provider is [name], [name], MD PCP is [name], [name], MD   HPI:   Patient returns for routine followup status post minimally invasive mitral valve repair on 09/19/2013.   She was last seen here in our office on 10/07/2013. Since then she has been seen in followup by Dr. Katrinka BlazingSmith who plans to see her again in April.  She reports doing exceptionally well. She states that her breathing is quite good and notably already better than it was prior to surgery. With physical activity she does not get short of breath anywhere near as quickly as she did before. She no longer has any significant pain in her chest. The only thing that seems to elicit pain is if she lies on her stomach when she tries to sleep at night. She has no pain with coughing more normal physical activity. She has not had any tachypalpitations or dizzy spells. Her energy level is good and she is starting to participate in outpatient cardiac rehabilitation program. She is eager to go back to work.   Current Outpatient Prescriptions  Medication Sig Dispense Refill  . amiodarone (PACERONE) 200 MG tablet Take 1 tablet (200 mg total) by mouth daily.      . Ferrous Sulfate (FER-IRON PO) Take 80 mg by mouth 2 (two) times daily.      . folic acid (FOLVITE) 1 MG tablet Take 1 tablet (1 mg total) by mouth daily.  30 tablet  1  . levothyroxine (SYNTHROID) 75 MCG tablet Take 75 mcg by mouth.      . losartan (COZAAR) 50 MG tablet Take 1 tablet (50 mg total) by mouth daily.  30 tablet  1  . metoprolol (LOPRESSOR) 50 MG tablet Take 50 mg by mouth daily.      . Multiple Vitamin (MULTIVITAMIN) tablet Take 1 tablet by mouth daily.      Marland Kitchen. oxyCODONE (OXY IR/ROXICODONE) 5 MG immediate release tablet Take 1-2 tablets (5-10 mg total) by mouth every 4 (four) hours as needed for moderate pain.  50 tablet  0  . warfarin  (COUMADIN) 5 MG tablet Take 5 mg by mouth See admin instructions. Takes 5mg  daily except 7.5mg  MWF       No current facility-administered medications for this visit.      Physical Exam:   There were no vitals taken for this visit.  General:  Well-appearing  Chest:   Clear to auscultation  CV:   Regular rate and rhythm without murmur  Incisions:  Healing nicely, scars do show signs of early keloid formation  Abdomen:  Soft and nontender  Extremities:  Warm and well-perfused with no lower extremity edema  Diagnostic Tests:  CHEST 2 VIEW  COMPARISON: DG CHEST 2 VIEW dated 10/07/2013; DG CHEST 2 VIEW dated  10/01/2013  FINDINGS:  The cardiac silhouette is partially obscured on the right. Appears  mild to moderately enlarged. Stable postsurgical changes in the  right hilum and right lateral chest wall. Volume loss is appreciated  within the right lung base. Stable areas of atelectasis versus  scarring within the base of the right hemi thorax. There is mild  improved aeration. Left hemithorax is clear. The osseous structures  demonstrate no abnormalities.  IMPRESSION:  Chronic changes within the right lung base  with improved aeration.  No new focal regions of consolidation or new focal infiltrates.  Electronically Signed  By: Salome Holmes M.D.  On: 11/04/2013 11:24    Impression:  Patient is doing exceptionally well 6 weeks status post minimally invasive mitral valve repair.  Plan:  I've encouraged patient to continue to increase her physical activity as tolerated without any particular limitations other than the fact that she should not do any heavy lifting for at least another 4-6 weeks. I think she can go back to work and otherwise resume unrestricted physical activity.  I've instructed her to remain on Coumadin for at least another 6 weeks. Once she is 3 months out from surgery she could stop Coumadin and go back to taking an aspirin a day. Because of her underlying  nonischemic cardiomyopathy and recent mitral valve repair, I think it would be reasonable to get a followup echocardiogram at some point in the near future. I've also suggested that she should start monitoring her blood pressure under regular basis. We will have her return in 3 months for routine followup. All of her questions been addressed.   [name], MD 11/04/2013 10:53 AM

## 2013-11-04 NOTE — Progress Notes (Addendum)
Pt started cardiac rehab today.  Pt tolerated light exercise without difficulty.  VSS, telemetry-NSR.  Asymptomatic.  PHQ-0.  Pt does exhibit psychosocial barriers to rehab participation. Pt demonstrates effective coping skills and supportive family. Pt offered emotional reassurance and support.   Pt oriented to exercise equipment and routine.  Understanding verbalized.

## 2013-11-04 NOTE — Patient Instructions (Signed)
Patient may resume normal physical activity without any particular limitations at this time, other than I would recommend no heavy lifting (>20 lbs) for another 4-6 weeks.  The patient may return to work as long as they can refrain from any heavy lifting or strenuous use of their arms or shoulders for at least 3 months from the time of surgery.  Get in the habit of checking your blood pressure on a regular basis.

## 2013-11-06 ENCOUNTER — Encounter (HOSPITAL_COMMUNITY)
Admission: RE | Admit: 2013-11-06 | Discharge: 2013-11-06 | Disposition: A | Discharge status: Home / Self Care | Payer: BC Managed Care – PPO | Source: Ambulatory Visit | Attending: Interventional Cardiology | Admitting: Interventional Cardiology

## 2013-11-07 ENCOUNTER — Ambulatory Visit (INDEPENDENT_AMBULATORY_CARE_PROVIDER_SITE_OTHER): Payer: BC Managed Care – PPO | Admitting: Pharmacist

## 2013-11-07 DIAGNOSIS — Z9889 Other specified postprocedural states: Secondary | ICD-10-CM

## 2013-11-07 DIAGNOSIS — Z5181 Encounter for therapeutic drug level monitoring: Secondary | ICD-10-CM

## 2013-11-07 LAB — POCT INR: INR: 2

## 2013-11-08 ENCOUNTER — Encounter (HOSPITAL_COMMUNITY): Payer: BC Managed Care – PPO

## 2013-11-08 ENCOUNTER — Encounter (HOSPITAL_COMMUNITY)
Admission: RE | Admit: 2013-11-08 | Discharge: 2013-11-08 | Disposition: A | Discharge status: Home / Self Care | Payer: BC Managed Care – PPO | Source: Ambulatory Visit | Attending: Interventional Cardiology | Admitting: Interventional Cardiology

## 2013-11-11 ENCOUNTER — Encounter (HOSPITAL_COMMUNITY): Payer: BC Managed Care – PPO

## 2013-11-13 ENCOUNTER — Encounter (HOSPITAL_COMMUNITY)
Admission: RE | Admit: 2013-11-13 | Discharge: 2013-11-13 | Disposition: A | Discharge status: Home / Self Care | Payer: BC Managed Care – PPO | Source: Ambulatory Visit | Attending: Interventional Cardiology | Admitting: Interventional Cardiology

## 2013-11-13 ENCOUNTER — Encounter (HOSPITAL_COMMUNITY): Payer: BC Managed Care – PPO

## 2013-11-13 ENCOUNTER — Telehealth (HOSPITAL_COMMUNITY): Payer: Self-pay | Admitting: *Deleted

## 2013-11-13 DIAGNOSIS — I509 Heart failure, unspecified: Secondary | ICD-10-CM | POA: Insufficient documentation

## 2013-11-13 DIAGNOSIS — Z5189 Encounter for other specified aftercare: Secondary | ICD-10-CM | POA: Insufficient documentation

## 2013-11-13 DIAGNOSIS — I279 Pulmonary heart disease, unspecified: Secondary | ICD-10-CM | POA: Insufficient documentation

## 2013-11-13 DIAGNOSIS — Z6841 Body Mass Index (BMI) 40.0 and over, adult: Secondary | ICD-10-CM | POA: Insufficient documentation

## 2013-11-13 DIAGNOSIS — I5042 Chronic combined systolic (congestive) and diastolic (congestive) heart failure: Secondary | ICD-10-CM | POA: Insufficient documentation

## 2013-11-13 NOTE — Progress Notes (Signed)
I have reviewed home exercise with [place]. The patient was advised to walk 2-4 days per week outside of CRP II for 15 minutes, 2 times per day until she can walk 30 minutes continuously.  Pt will also complete one additional day of hand weights outside of CRP II.  Progression of exercise prescription was discussed.  Reviewed THR, pulse, RPE, sign and symptoms and when to call 911 or MD.  Pt voiced understanding. [id number]  Alexia Freestone, MS, ACSM RCEP 11/13/2013 9:38 AM

## 2013-11-13 NOTE — Telephone Encounter (Signed)
Message copied by Chelsea Aus on Wed Nov 13, 2013  7:15 AM ------      Message from: Verdis Prime      Created: Mon Nov 11, 2013  5:32 PM      Regarding: RE: Clarify metoprolol dose       Metoprolol should be 50 mg BID      ----- Message -----         From: Chelsea Aus, RN         Sent: 11/08/2013   9:19 AM           To: Lesleigh Noe, MD, #      Subject: Clarify metoprolol dose                                        Pt is currently exercising with [place] in cardiac rehab.  Noted that her HR at rest upper 90's and sometimes exceeds her Target heart Rate - 155.  Reviewed medications and noted that pt is taking Metoprolol 50 mg daily.  However upon further review at one time it was listed as bid.  This was noted when pt did her initial intake appointment with the pharmacist.  This is a direct quote from the pharmacist's note              "Ms. Petry is a 28 yo pleasant AAF who presents today for medication review. I have updated all of her medications and NKA. Patient is followed by [facility] for routine INR checks, currently aware of amiodarone use. Per patient report, she has been taking metoprolol tartrate 50mg  once daily. HR this AM 91. Would consider dosing twice daily if patient has consistently elevated HR. No changes at this time".  Pharmacist changed her medication to daily as reported by the pt,            It is unclear when the change to daily occurred.  How often should pt take this particular medication?            Thanks for the feedback      [employer] RN                    ------

## 2013-11-15 ENCOUNTER — Encounter (HOSPITAL_COMMUNITY): Payer: BC Managed Care – PPO

## 2013-11-15 ENCOUNTER — Encounter (HOSPITAL_COMMUNITY): Admission: RE | Admit: 2013-11-15 | Payer: BC Managed Care – PPO | Source: Ambulatory Visit

## 2013-11-18 ENCOUNTER — Encounter (HOSPITAL_COMMUNITY)
Admission: RE | Admit: 2013-11-18 | Discharge: 2013-11-18 | Disposition: A | Discharge status: Home / Self Care | Payer: BC Managed Care – PPO | Source: Ambulatory Visit | Attending: Interventional Cardiology | Admitting: Interventional Cardiology

## 2013-11-18 ENCOUNTER — Encounter (HOSPITAL_COMMUNITY): Payer: BC Managed Care – PPO

## 2013-11-20 ENCOUNTER — Encounter (HOSPITAL_COMMUNITY): Payer: BC Managed Care – PPO

## 2013-11-20 ENCOUNTER — Ambulatory Visit (INDEPENDENT_AMBULATORY_CARE_PROVIDER_SITE_OTHER): Payer: BC Managed Care – PPO | Admitting: Pharmacist

## 2013-11-20 DIAGNOSIS — Z5181 Encounter for therapeutic drug level monitoring: Secondary | ICD-10-CM

## 2013-11-20 DIAGNOSIS — Z9889 Other specified postprocedural states: Secondary | ICD-10-CM

## 2013-11-20 LAB — POCT INR: INR: 1.6

## 2013-11-22 ENCOUNTER — Encounter (HOSPITAL_COMMUNITY): Payer: BC Managed Care – PPO

## 2013-11-25 ENCOUNTER — Encounter (HOSPITAL_COMMUNITY): Payer: BC Managed Care – PPO

## 2013-11-27 ENCOUNTER — Other Ambulatory Visit: Payer: Self-pay | Admitting: Surgical

## 2013-11-27 ENCOUNTER — Encounter (HOSPITAL_COMMUNITY): Payer: BC Managed Care – PPO

## 2013-11-27 ENCOUNTER — Other Ambulatory Visit: Payer: Self-pay | Admitting: Physician Assistant

## 2013-11-28 ENCOUNTER — Ambulatory Visit (INDEPENDENT_AMBULATORY_CARE_PROVIDER_SITE_OTHER): Payer: BC Managed Care – PPO | Admitting: Pharmacist

## 2013-11-28 DIAGNOSIS — Z9889 Other specified postprocedural states: Secondary | ICD-10-CM

## 2013-11-28 DIAGNOSIS — Z5181 Encounter for therapeutic drug level monitoring: Secondary | ICD-10-CM

## 2013-11-28 LAB — POCT INR: INR: 1.4

## 2013-11-29 ENCOUNTER — Encounter (HOSPITAL_COMMUNITY): Payer: BC Managed Care – PPO

## 2013-12-02 ENCOUNTER — Encounter (HOSPITAL_COMMUNITY): Payer: BC Managed Care – PPO

## 2013-12-04 ENCOUNTER — Encounter (HOSPITAL_COMMUNITY): Payer: BC Managed Care – PPO

## 2013-12-05 ENCOUNTER — Ambulatory Visit (INDEPENDENT_AMBULATORY_CARE_PROVIDER_SITE_OTHER): Payer: BC Managed Care – PPO | Admitting: Pharmacist

## 2013-12-05 DIAGNOSIS — Z9889 Other specified postprocedural states: Secondary | ICD-10-CM

## 2013-12-05 DIAGNOSIS — Z5181 Encounter for therapeutic drug level monitoring: Secondary | ICD-10-CM

## 2013-12-05 LAB — POCT INR: INR: 1.6

## 2013-12-05 MED ORDER — WARFARIN SODIUM 5 MG PO TABS: ORAL_TABLET | ORAL | Status: DC

## 2013-12-05 MED ORDER — LOSARTAN POTASSIUM 50 MG PO TABS: 50.0000 mg | ORAL_TABLET | Freq: Every day | ORAL | Status: DC

## 2013-12-06 ENCOUNTER — Encounter (HOSPITAL_COMMUNITY)
Admission: RE | Admit: 2013-12-06 | Discharge: 2013-12-06 | Disposition: A | Discharge status: Home / Self Care | Payer: BC Managed Care – PPO | Source: Ambulatory Visit | Attending: Interventional Cardiology | Admitting: Interventional Cardiology

## 2013-12-06 ENCOUNTER — Encounter (HOSPITAL_COMMUNITY): Payer: BC Managed Care – PPO

## 2013-12-06 NOTE — Progress Notes (Signed)
Jarrett Ables 28 y.o. female Nutrition Note Spoke with pt.  Nutrition Plan and Nutrition Survey goals reviewed with pt. Pt is not following the Therapeutic Lifestyle Changes diet. Pt feels Coumadin is preventing her from "eating too many salads." Consistent Vitamin K intake discussed. Pt expressed understanding of the information reviewed. Pt aware of nutrition education classes offered.  Nutrition Diagnosis   Food-and nutrition-related knowledge deficit related to lack of exposure to information as related to diagnosis of: ? CVD ? Pre-DM (A1c 5.8) ?   Obesity related to excessive energy intake as evidenced by a BMI of 43.6  Nutrition RX/ Estimated Daily Nutrition Needs for: wt loss  1650-2150 Kcal, 45-55 gm fat, 10-16 gm sat fat, 1.6-2.1 gm trans-fat, <1500 mg sodium  Nutrition Intervention   Pt's individual nutrition plan reviewed with pt.   Benefits of adopting Therapeutic Lifestyle Changes discussed when Medficts reviewed.   Pt given the handout for: Consistent Vitamin K intake   Pt to attend the Portion Distortion class   Pt to attend the  ? Nutrition I class                     ? Nutrition II class   Continue client-centered nutrition education by RD, as part of interdisciplinary care. Goal(s)   Pt to identify and limit food sources of saturated fat, trans fat, and cholesterol   Pt to identify food quantities necessary to achieve: ? wt loss to a goal wt of 230-248 lb (104.4-112.6 kg) at graduation from cardiac rehab.  Monitor and Evaluate progress toward nutrition goal with team. Nutrition Risk: Change to Moderate Mickle Plumb, M.Ed, RD, LDN, CDE 12/06/2013 7:55 AM

## 2013-12-06 NOTE — Progress Notes (Signed)
Pt has completed 6 exercise sessions thus far.  It is difficult for pt to attend regularly due to work scheduled meetings she must attend.  Pt asked if she may take a break until school dismiss on 6/18 then resume.  Pt advised that with the cardiac rehab program that is designed at 12 weeks.  Patients can be extended to 18 weeks.  Pt 18 week from her start day is 7/24.  Pt advised that if she should resume exercise on 6/19 she will complete 23 sessions (no days missed) up till 7/24.  Pt is in agreement of this and feels she would be able to regularly attend and participate if she did not have to worry about arriving to work late.  Will plan for pt to be on scheduled absence with a return to rehab date of 6/19. Alanson Aly, BSN

## 2013-12-09 ENCOUNTER — Encounter (HOSPITAL_COMMUNITY): Payer: BC Managed Care – PPO

## 2013-12-11 ENCOUNTER — Encounter (HOSPITAL_COMMUNITY): Payer: BC Managed Care – PPO

## 2013-12-13 ENCOUNTER — Encounter (HOSPITAL_COMMUNITY): Payer: BC Managed Care – PPO

## 2013-12-13 ENCOUNTER — Ambulatory Visit (INDEPENDENT_AMBULATORY_CARE_PROVIDER_SITE_OTHER): Payer: BC Managed Care – PPO | Admitting: *Deleted

## 2013-12-13 DIAGNOSIS — Z5181 Encounter for therapeutic drug level monitoring: Secondary | ICD-10-CM

## 2013-12-13 DIAGNOSIS — Z9889 Other specified postprocedural states: Secondary | ICD-10-CM

## 2013-12-13 LAB — POCT INR: INR: 1.9

## 2013-12-16 ENCOUNTER — Ambulatory Visit: Payer: BC Managed Care – PPO | Admitting: Interventional Cardiology

## 2013-12-16 ENCOUNTER — Encounter (HOSPITAL_COMMUNITY): Payer: BC Managed Care – PPO

## 2013-12-16 ENCOUNTER — Encounter: Payer: Self-pay | Admitting: Interventional Cardiology

## 2013-12-16 ENCOUNTER — Ambulatory Visit (INDEPENDENT_AMBULATORY_CARE_PROVIDER_SITE_OTHER): Payer: BC Managed Care – PPO | Admitting: Interventional Cardiology

## 2013-12-16 VITALS — BP 151/76 | HR 76 | Ht 64.0 in | Wt 256.0 lb

## 2013-12-16 DIAGNOSIS — I5042 Chronic combined systolic (congestive) and diastolic (congestive) heart failure: Secondary | ICD-10-CM

## 2013-12-16 DIAGNOSIS — Z9889 Other specified postprocedural states: Secondary | ICD-10-CM

## 2013-12-16 DIAGNOSIS — I509 Heart failure, unspecified: Secondary | ICD-10-CM

## 2013-12-16 DIAGNOSIS — Z5181 Encounter for therapeutic drug level monitoring: Secondary | ICD-10-CM

## 2013-12-16 NOTE — Progress Notes (Signed)
Patient ID: Paula AblesShatina Renee Apuzzo, female   DOB: 1986-07-26, 28 y.o.   MRN: [id number]    [address] [place], [place]  [address] Phone: [phone] Fax:  [phone]  Date:  12/16/2013   ID:  Paula AblesShatina Renee Fessel, DOB 1986-07-26, MRN [id number]  PCP:  [name], [name], MD   ASSESSMENT:  1. status post mitral valve repair without any clinical symptoms 2. Chronic Coumadin therapy post mitral valve repair 3. No recurrence of atrial fibrillation off amiodarone  PLAN:  1. I will plan to discontinue Coumadin when I see her back in 3 months.   SUBJECTIVE: Paula Wolfe is a 28 y.o. female who is back to work. She is due a well. No orthopnea. Endurance returned to normal. She denies chest pain. No palpitations or sustained arrhythmia. There no neurological complaints. No bleeding on Coumadin.   Wt Readings from Last 3 Encounters:  12/16/13 256 lb (116.121 kg)  11/04/13 254 lb (115.214 kg)  10/31/13 254 lb 3.1 oz (115.3 kg)     Past Medical History  Diagnosis Date  . Hypothyroidism   . Nontoxic multinodular goiter     a. Pt reports prior ultrasound/bx that were negative but was told she would need to keep an eye on them in the future. Benign FNA per [facility] for this dx.  . Menometrorrhagia     a. Improved after tx of hypothyroidism, but recurred 05/2013.  . Broken foot     a. Remote broken foot with residual chronic LLE edema.  . Acute on chronic combined systolic and diastolic heart failure 09/12/2013  . Mitral regurgitation 09/12/2013    severe by TEE  . Multinodular goiter   . Pulmonary hypertension 09/13/2013  . Obesity   . Anemia     iron deficient, attributed to menometrorrhagia  . Chronic combined systolic and diastolic CHF (congestive heart failure)   . Morbid obesity with BMI of 45.0-49.9, adult 09/16/2013  . H/O syncope     prior to finding out about the mitral regurgitation  . Shortness of breath     sometimes with exertion  .  Headache(784.0)     headaches  . 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 Non-ischemic cardiomyopathy     Current Outpatient Prescriptions  Medication Sig Dispense Refill  . Ferrous Sulfate (FER-IRON PO) Take 80 mg by mouth 2 (two) times daily.      . folic acid (FOLVITE) 1 MG tablet Take 1 tablet (1 mg total) by mouth daily.  30 tablet  1  . levothyroxine (SYNTHROID) 75 MCG tablet Take 75 mcg by mouth.      . losartan (COZAAR) 50 MG tablet Take 1 tablet (50 mg total) by mouth daily.  30 tablet  5  . metoprolol (LOPRESSOR) 50 MG tablet Take 50 mg by mouth 2 (two) times daily.       . Multiple Vitamin (MULTIVITAMIN) tablet Take 1 tablet by mouth daily.      Marland Kitchen. oxyCODONE (OXY IR/ROXICODONE) 5 MG immediate release tablet Take 1-2 tablets (5-10 mg total) by mouth every 4 (four) hours as needed for moderate pain.  50 tablet  0  . warfarin (COUMADIN) 5 MG tablet Take as directed by coumadin clinic  50 tablet  3   No current facility-administered medications for this visit.    Allergies:   No Known Allergies  Social History:  The patient  reports that she has never smoked. She has never used smokeless tobacco. She reports that she drinks alcohol. She reports that she does not use illicit drugs.   ROS:  Please see the history of present illness.   No edema. No syncope. No significant complaints   All other systems reviewed and negative.   OBJECTIVE: VS:  BP 151/76  Pulse 76  Ht 5\' 4"  (1.626 m)  Wt 256 lb (116.121 kg)  BMI 43.92 kg/m2 Well nourished, well developed, in no acute distress, obese HEENT: normal Neck: JVD flat. Carotid bruit absent  Cardiac:  normal S1, S2; RRR; no murmur Lungs:  clear to auscultation bilaterally, no wheezing, rhonchi or rales Abd: soft, nontender, no hepatomegaly Ext: Edema absent. Pulses 2+ Skin: warm and dry Neuro:  CNs 2-12 intact, no focal  abnormalities noted  EKG:  Not repeated       Signed, Darci Needle III, MD 12/16/2013 5:11 PM

## 2013-12-16 NOTE — Patient Instructions (Signed)
Your physician recommends that you continue on your current medications as directed. Please refer to the Current Medication list given to you today.   Your physician recommends that you schedule a follow-up appointment scheduled for 03/18/14 @1 :15pm

## 2013-12-18 ENCOUNTER — Encounter (HOSPITAL_COMMUNITY): Payer: BC Managed Care – PPO

## 2013-12-20 ENCOUNTER — Encounter (HOSPITAL_COMMUNITY): Payer: BC Managed Care – PPO

## 2013-12-20 ENCOUNTER — Other Ambulatory Visit: Payer: Self-pay | Admitting: Surgical

## 2013-12-23 ENCOUNTER — Encounter (HOSPITAL_COMMUNITY): Payer: BC Managed Care – PPO

## 2013-12-25 ENCOUNTER — Encounter (HOSPITAL_COMMUNITY): Payer: BC Managed Care – PPO

## 2013-12-27 ENCOUNTER — Encounter (HOSPITAL_COMMUNITY): Payer: BC Managed Care – PPO

## 2013-12-30 ENCOUNTER — Encounter (HOSPITAL_COMMUNITY): Payer: BC Managed Care – PPO

## 2013-12-30 ENCOUNTER — Ambulatory Visit (INDEPENDENT_AMBULATORY_CARE_PROVIDER_SITE_OTHER): Payer: BC Managed Care – PPO | Admitting: Pharmacist

## 2013-12-30 DIAGNOSIS — Z9889 Other specified postprocedural states: Secondary | ICD-10-CM

## 2013-12-30 DIAGNOSIS — Z5181 Encounter for therapeutic drug level monitoring: Secondary | ICD-10-CM

## 2013-12-30 LAB — POCT INR: INR: 1.6

## 2014-01-01 ENCOUNTER — Encounter (HOSPITAL_COMMUNITY): Payer: BC Managed Care – PPO

## 2014-01-03 ENCOUNTER — Encounter (HOSPITAL_COMMUNITY): Payer: BC Managed Care – PPO

## 2014-01-06 ENCOUNTER — Encounter (HOSPITAL_COMMUNITY): Payer: BC Managed Care – PPO

## 2014-01-08 ENCOUNTER — Encounter (HOSPITAL_COMMUNITY): Payer: BC Managed Care – PPO

## 2014-01-10 ENCOUNTER — Encounter (HOSPITAL_COMMUNITY): Payer: BC Managed Care – PPO

## 2014-01-13 ENCOUNTER — Encounter (HOSPITAL_COMMUNITY): Payer: BC Managed Care – PPO

## 2014-01-15 ENCOUNTER — Encounter (HOSPITAL_COMMUNITY): Payer: BC Managed Care – PPO

## 2014-01-17 ENCOUNTER — Encounter (HOSPITAL_COMMUNITY): Payer: BC Managed Care – PPO

## 2014-01-20 ENCOUNTER — Encounter (HOSPITAL_COMMUNITY): Payer: BC Managed Care – PPO

## 2014-01-22 ENCOUNTER — Encounter (HOSPITAL_COMMUNITY): Payer: BC Managed Care – PPO

## 2014-01-24 ENCOUNTER — Encounter (HOSPITAL_COMMUNITY): Payer: BC Managed Care – PPO

## 2014-01-24 ENCOUNTER — Telehealth (HOSPITAL_COMMUNITY): Payer: Self-pay | Admitting: *Deleted

## 2014-01-24 NOTE — Telephone Encounter (Signed)
Return call from message left earlier.  Ask pt to please contact again.  Contact information provided.

## 2014-01-27 ENCOUNTER — Encounter (HOSPITAL_COMMUNITY): Payer: BC Managed Care – PPO

## 2014-01-29 ENCOUNTER — Encounter (HOSPITAL_COMMUNITY): Payer: BC Managed Care – PPO

## 2014-01-31 ENCOUNTER — Encounter (HOSPITAL_COMMUNITY): Payer: BC Managed Care – PPO

## 2014-02-03 ENCOUNTER — Encounter (HOSPITAL_COMMUNITY): Payer: BC Managed Care – PPO

## 2014-02-05 ENCOUNTER — Encounter (HOSPITAL_COMMUNITY): Payer: BC Managed Care – PPO

## 2014-02-06 ENCOUNTER — Telehealth (HOSPITAL_COMMUNITY): Payer: Self-pay | Admitting: *Deleted

## 2014-02-06 NOTE — Telephone Encounter (Signed)
Called pt regarding return date for exercise. Pt under the impression that she had to attend orientation and start over again.  Pt informed that she was placed on hold until the end of the school year.  Pt will begin back on tomorrow 02/07/14.

## 2014-02-07 ENCOUNTER — Telehealth (HOSPITAL_COMMUNITY): Payer: Self-pay | Admitting: *Deleted

## 2014-02-07 ENCOUNTER — Encounter (HOSPITAL_COMMUNITY): Admission: RE | Admit: 2014-02-07 | Payer: BC Managed Care – PPO | Source: Ambulatory Visit

## 2014-02-07 ENCOUNTER — Encounter (HOSPITAL_COMMUNITY): Payer: BC Managed Care – PPO

## 2014-02-07 NOTE — Telephone Encounter (Signed)
Message left regarding intent on returning to exercise.  Pt given alternate class times if the 6:45 class is not working for her.  Also indicated that if she is already exercising regularly and does not want to participate that will be okay.   Please let rehab know what her plan is.  Contact information given.

## 2014-02-10 ENCOUNTER — Encounter: Payer: Self-pay | Admitting: Thoracic Surgery (Cardiothoracic Vascular Surgery)

## 2014-02-10 ENCOUNTER — Ambulatory Visit (INDEPENDENT_AMBULATORY_CARE_PROVIDER_SITE_OTHER): Payer: BC Managed Care – PPO | Admitting: Thoracic Surgery (Cardiothoracic Vascular Surgery)

## 2014-02-10 ENCOUNTER — Ambulatory Visit (HOSPITAL_COMMUNITY): Payer: BC Managed Care – PPO

## 2014-02-10 VITALS — BP 124/82 | HR 88 | Resp 20 | Ht 64.0 in | Wt 256.0 lb

## 2014-02-10 DIAGNOSIS — Z9889 Other specified postprocedural states: Secondary | ICD-10-CM

## 2014-02-10 DIAGNOSIS — I059 Rheumatic mitral valve disease, unspecified: Secondary | ICD-10-CM

## 2014-02-10 DIAGNOSIS — I340 Nonrheumatic mitral (valve) insufficiency: Secondary | ICD-10-CM

## 2014-02-10 NOTE — Patient Instructions (Signed)

## 2014-02-10 NOTE — Progress Notes (Signed)
      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 She was last seen here in our office on 11/04/2013. Since then she has been seen in followup by Dr. Katrinka Blazing who plans to see her again in August.  She continues to do very well. She is back to normal activity and reports no problems with exertional shortness of breath. She states that her breathing and exercise tolerance is better than it was prior to surgery. She's not had any palpitations or dizzy spells.    Current Outpatient Prescriptions  Medication Sig Dispense Refill  . Ferrous Sulfate (FER-IRON PO) Take 80 mg by mouth 2 (two) times daily.      . folic acid (FOLVITE) 1 MG tablet Take 1 tablet (1 mg total) by mouth daily.  30 tablet  1  . levothyroxine (SYNTHROID) 75 MCG tablet Take 75 mcg by mouth.      . losartan (COZAAR) 50 MG tablet Take 1 tablet (50 mg total) by mouth daily.  30 tablet  5  . metoprolol (LOPRESSOR) 50 MG tablet Take 50 mg by mouth 2 (two) times daily.       . Multiple Vitamin (MULTIVITAMIN) tablet Take 1 tablet by mouth daily.      Marland Kitchen warfarin (COUMADIN) 5 MG tablet Take as directed by coumadin clinic  50 tablet  3   No current facility-administered medications for this visit.      Physical Exam:   BP 124/82  Pulse 88  Resp 20  Ht 5\' 4"  (1.626 m)  Wt 256 lb (116.121 kg)  BMI 43.92 kg/m2  SpO2 97%  General:  Well-appearing  Chest:   Clear  CV:   Regular rate and rhythm without murmur  Incisions:  Completely healed  Abdomen:  Soft and nontender  Extremities:  Warm and  well-perfused  Diagnostic Tests:  n/a   Impression:  Patient is doing well more than 4 months status post minimally invasive mitral valve repair.  Plan:  It would be reasonable to discontinue Coumadin at any point in time. She has been reminded regarding the need for long-term antibiotic prophylaxis for all dental cleaning and related procedures. She will return for routine 1 year followup next February.  I spent in excess of 15 minutes during the conduct of this office consultation and >50% of this time involved direct face-to-face encounter with the patient for counseling and/or coordination of their care.   [name], MD 02/10/2014 11:34 AM

## 2014-02-12 ENCOUNTER — Ambulatory Visit (HOSPITAL_COMMUNITY): Payer: BC Managed Care – PPO

## 2014-02-17 ENCOUNTER — Ambulatory Visit (HOSPITAL_COMMUNITY): Payer: BC Managed Care – PPO

## 2014-02-19 ENCOUNTER — Ambulatory Visit (HOSPITAL_COMMUNITY): Payer: BC Managed Care – PPO

## 2014-02-21 ENCOUNTER — Ambulatory Visit (HOSPITAL_COMMUNITY): Payer: BC Managed Care – PPO

## 2014-02-24 ENCOUNTER — Inpatient Hospital Stay (HOSPITAL_COMMUNITY): Admission: RE | Admit: 2014-02-24 | Payer: BC Managed Care – PPO | Source: Ambulatory Visit

## 2014-02-26 ENCOUNTER — Ambulatory Visit (HOSPITAL_COMMUNITY): Payer: BC Managed Care – PPO

## 2014-02-28 ENCOUNTER — Ambulatory Visit (HOSPITAL_COMMUNITY): Payer: BC Managed Care – PPO

## 2014-03-03 ENCOUNTER — Ambulatory Visit (HOSPITAL_COMMUNITY): Payer: BC Managed Care – PPO

## 2014-03-05 ENCOUNTER — Ambulatory Visit (HOSPITAL_COMMUNITY): Payer: BC Managed Care – PPO

## 2014-03-07 ENCOUNTER — Ambulatory Visit (HOSPITAL_COMMUNITY): Payer: BC Managed Care – PPO

## 2014-03-18 ENCOUNTER — Encounter: Payer: Self-pay | Admitting: Interventional Cardiology

## 2014-03-18 ENCOUNTER — Ambulatory Visit (INDEPENDENT_AMBULATORY_CARE_PROVIDER_SITE_OTHER): Payer: BC Managed Care – PPO | Admitting: Interventional Cardiology

## 2014-03-18 VITALS — BP 145/87 | HR 64 | Ht 64.0 in | Wt 270.0 lb

## 2014-03-18 DIAGNOSIS — Z9889 Other specified postprocedural states: Secondary | ICD-10-CM

## 2014-03-18 DIAGNOSIS — I5042 Chronic combined systolic (congestive) and diastolic (congestive) heart failure: Secondary | ICD-10-CM

## 2014-03-18 DIAGNOSIS — I509 Heart failure, unspecified: Secondary | ICD-10-CM

## 2014-03-18 NOTE — Patient Instructions (Addendum)
Your physician has recommended you make the following change in your medication:  1) STOP Couamdin 2) START Aspirin 81mg  daily  Take all other medications as prescribed  Your physician wants you to follow-up in: 6 months You will receive a reminder letter in the mail two months in advance. If you don't receive a letter, please call our office to schedule the follow-up appointment.

## 2014-03-18 NOTE — Progress Notes (Signed)
Patient ID: Paula AblesShatina Renee Wolfe, female   DOB: Apr 17, 1986, 28 y.o.   MRN: [id number]    [address] [place], [place]  [address] Phone: [phone]([phone] Fax:  [phone]  Date:  03/18/2014   ID:  Paula Wolfe, DOB Apr 17, 1986, MRN [id number]  PCP:  [name], [name], MD   ASSESSMENT:  1. Mitral valve repair with no clinical evidence of recurrent 2. Anticoagulation therapy 3. Obesity 4. Chronic diastolic heart failure, asymptomatic. Current systolic function is unknown. We'll continue heart failure therapy and around the time of the next office visit consider an echocardiogram to   PLAN:  1. Discontinue Coumadin, and start aspirin 81 mg per day 2. Followup in 6 months 3. On return in 6 months, perform an echo to evaluate LV function. She wants to come off the beta blocker and ARB therapy. If LV size and function have gone back to normal perhaps decreasing therapy would be considered. This therapy may be lifelong.  SUBJECTIVE: Paula Wolfe is a 28 y.o. female who feels normal. She has no dyspnea. No orthopnea or PND she has not had peripheral edema. She denies palpitations.   Wt Readings from Last 3 Encounters:  03/18/14 270 lb (122.471 kg)  02/10/14 256 lb (116.121 kg)  12/16/13 256 lb (116.121 kg)     Past Medical History  Diagnosis Date  . Hypothyroidism   . Nontoxic multinodular goiter     a. Pt reports prior ultrasound/bx that were negative but was told she would need to keep an eye on them in the future. Benign FNA per [facility] for this dx.  . Menometrorrhagia     a. Improved after tx of hypothyroidism, but recurred 05/2013.  . Broken foot     a. Remote broken foot with residual chronic LLE edema.  . Acute on chronic combined systolic and diastolic heart failure 09/12/2013  . Mitral regurgitation 09/12/2013    severe by TEE  . Multinodular goiter   . Pulmonary hypertension 09/13/2013  . Obesity   . Anemia     iron deficient, attributed  to menometrorrhagia  . Chronic combined systolic and diastolic CHF (congestive heart failure)   . Morbid obesity with BMI of 45.0-49.9, adult 09/16/2013  . H/O syncope     prior to finding out about the mitral regurgitation  . Shortness of breath     sometimes with exertion  . Headache(784.0)     headaches  . 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 Non-ischemic cardiomyopathy     Current Outpatient Prescriptions  Medication Sig Dispense Refill  . Ferrous Sulfate (FER-IRON PO) Take 80 mg by mouth 2 (two) times daily.      Marland Kitchen. levothyroxine (SYNTHROID) 75 MCG tablet Take 75 mcg by mouth.      . losartan (COZAAR) 50 MG tablet Take 1 tablet (50 mg total) by mouth daily.  30 tablet  5  . metoprolol (LOPRESSOR) 50 MG tablet Take 50 mg by mouth 2 (two) times daily.       . Multiple Vitamin (MULTIVITAMIN) tablet Take 1 tablet by mouth daily.      Marland Kitchen. warfarin (COUMADIN) 5 MG tablet Take as directed by coumadin clinic  50 tablet  3   No current facility-administered medications for this visit.    Allergies:   No Known Allergies  Social History:  The patient  reports that she  has never smoked. She has never used smokeless tobacco. She reports that she drinks alcohol. She reports that she does not use illicit drugs.   ROS:  Please see the history of present illness.   No stroke like symptoms, orthopnea, PND, dizziness, or syncope   All other systems reviewed and negative.   OBJECTIVE: VS:  BP 145/87  Pulse 64  Ht 5\' 4"  (1.626 m)  Wt 270 lb (122.471 kg)  BMI 46.32 kg/m2 Well nourished, well developed, in no acute distress, obese HEENT: normal Neck: JVD flat. Carotid bruit absent  Cardiac:  normal S1, S2; RRR; no murmur Lungs:  clear to auscultation bilaterally, no wheezing, rhonchi or rales Abd: soft, nontender, no hepatomegaly Ext: Edema absent. Pulses normal Skin: warm and  dry Neuro:  CNs 2-12 intact, no focal abnormalities noted  EKG:  Not for       Signed, Darci Needle III, MD 03/18/2014 11:38 AM

## 2014-05-28 ENCOUNTER — Other Ambulatory Visit: Payer: Self-pay | Admitting: *Deleted

## 2014-05-28 MED ORDER — LOSARTAN POTASSIUM 50 MG PO TABS: 50.0000 mg | ORAL_TABLET | Freq: Every day | ORAL | Status: DC

## 2014-07-23 ENCOUNTER — Telehealth: Payer: Self-pay | Admitting: Interventional Cardiology

## 2014-07-23 NOTE — Telephone Encounter (Signed)
Dr. Myrtis Ser DOD aware and agree that the pain  could be stressor muscle pain .MD  recommends for pt to have an appointment made for sooner than February 2016. Pt has an appointment with Scott weaver on 08/11/14 at 10:05 Am pt is aware she verbalized understanding.

## 2014-07-23 NOTE — Telephone Encounter (Signed)
Pt has as history of Mitral valve repair on march 2015. She is being F/U by Dr. Katrinka Blazing for  a history of CHF. Pt states that on Monday and Tuesday had chest pain that lasted about 2 minutes every time between 9 AM and 12 noon both days.  Pt denies any other symptoms just a pain on left side of chest pain score of "6". Pt was seen in the ER Monday and was found that nothing was wrong with her heart, that it could be stress, pt is a Runner, broadcasting/film/video. Pt is feeling fine today. She wants to be seen today or tomorrow. Pt was made aware that possible could be muscle pain.

## 2014-07-23 NOTE — Telephone Encounter (Signed)
New Message        Pt calling stating she had chest pain yesterday, went to the hosp and they said they couldn't find anything wrong. Pt wants to speak to a nurse so she can find out if she needs to make an appt to see Dr. Katrinka Blazing. Please call back and advise.

## 2014-07-24 ENCOUNTER — Encounter (HOSPITAL_COMMUNITY): Payer: Self-pay | Admitting: Interventional Cardiology

## 2014-08-11 ENCOUNTER — Ambulatory Visit: Payer: BC Managed Care – PPO | Admitting: Physician Assistant

## 2014-09-04 ENCOUNTER — Telehealth: Payer: Self-pay | Admitting: Interventional Cardiology

## 2014-09-04 NOTE — Telephone Encounter (Signed)
Message routed to Dr Smith

## 2014-09-04 NOTE — Telephone Encounter (Signed)
New Message  Pt stated that she found out she was pregnant and wanted to speak w/ Rn or Dr. Katrinka Blazing about how to proceed. Please call back and discuss.

## 2014-09-05 ENCOUNTER — Emergency Department (HOSPITAL_COMMUNITY): Payer: BC Managed Care – PPO

## 2014-09-05 ENCOUNTER — Encounter (HOSPITAL_COMMUNITY): Payer: Self-pay | Admitting: Emergency Medicine

## 2014-09-05 ENCOUNTER — Emergency Department (HOSPITAL_COMMUNITY)
Admission: EM | Admit: 2014-09-05 | Discharge: 2014-09-05 | Disposition: A | Discharge status: Home / Self Care | Payer: BC Managed Care – PPO | Attending: Emergency Medicine | Admitting: Emergency Medicine

## 2014-09-05 DIAGNOSIS — Z3A01 Less than 8 weeks gestation of pregnancy: Secondary | ICD-10-CM | POA: Diagnosis not present

## 2014-09-05 DIAGNOSIS — Z7901 Long term (current) use of anticoagulants: Secondary | ICD-10-CM | POA: Diagnosis not present

## 2014-09-05 DIAGNOSIS — Z79899 Other long term (current) drug therapy: Secondary | ICD-10-CM | POA: Insufficient documentation

## 2014-09-05 DIAGNOSIS — Z954 Presence of other heart-valve replacement: Secondary | ICD-10-CM | POA: Diagnosis not present

## 2014-09-05 DIAGNOSIS — O99011 Anemia complicating pregnancy, first trimester: Secondary | ICD-10-CM | POA: Diagnosis not present

## 2014-09-05 DIAGNOSIS — N939 Abnormal uterine and vaginal bleeding, unspecified: Secondary | ICD-10-CM

## 2014-09-05 DIAGNOSIS — D649 Anemia, unspecified: Secondary | ICD-10-CM | POA: Diagnosis not present

## 2014-09-05 DIAGNOSIS — O99211 Obesity complicating pregnancy, first trimester: Secondary | ICD-10-CM | POA: Diagnosis not present

## 2014-09-05 DIAGNOSIS — I5042 Chronic combined systolic (congestive) and diastolic (congestive) heart failure: Secondary | ICD-10-CM | POA: Insufficient documentation

## 2014-09-05 DIAGNOSIS — Z8781 Personal history of (healed) traumatic fracture: Secondary | ICD-10-CM | POA: Insufficient documentation

## 2014-09-05 DIAGNOSIS — O99411 Diseases of the circulatory system complicating pregnancy, first trimester: Secondary | ICD-10-CM | POA: Diagnosis not present

## 2014-09-05 DIAGNOSIS — O99281 Endocrine, nutritional and metabolic diseases complicating pregnancy, first trimester: Secondary | ICD-10-CM | POA: Diagnosis not present

## 2014-09-05 DIAGNOSIS — E039 Hypothyroidism, unspecified: Secondary | ICD-10-CM | POA: Diagnosis not present

## 2014-09-05 DIAGNOSIS — O200 Threatened abortion: Secondary | ICD-10-CM | POA: Diagnosis not present

## 2014-09-05 DIAGNOSIS — O209 Hemorrhage in early pregnancy, unspecified: Secondary | ICD-10-CM | POA: Diagnosis present

## 2014-09-05 LAB — CBC WITH DIFFERENTIAL/PLATELET
BASOS PCT: 0 % (ref 0–1)
Basophils Absolute: 0 10*3/uL (ref 0.0–0.1)
EOS ABS: 0.1 10*3/uL (ref 0.0–0.7)
EOS PCT: 1 % (ref 0–5)
HCT: 37.2 % (ref 36.0–46.0)
Hemoglobin: 13.2 g/dL (ref 12.0–15.0)
Lymphocytes Relative: 26 % (ref 12–46)
Lymphs Abs: 1.6 10*3/uL (ref 0.7–4.0)
MCH: 30.8 pg (ref 26.0–34.0)
MCHC: 35.5 g/dL (ref 30.0–36.0)
MCV: 86.9 fL (ref 78.0–100.0)
MONOS PCT: 8 % (ref 3–12)
Monocytes Absolute: 0.5 10*3/uL (ref 0.1–1.0)
Neutro Abs: 3.9 10*3/uL (ref 1.7–7.7)
Neutrophils Relative %: 65 % (ref 43–77)
Platelets: 189 10*3/uL (ref 150–400)
RBC: 4.28 MIL/uL (ref 3.87–5.11)
RDW: 13.4 % (ref 11.5–15.5)
WBC: 6 10*3/uL (ref 4.0–10.5)

## 2014-09-05 LAB — COMPREHENSIVE METABOLIC PANEL
ALT: 14 U/L (ref 0–35)
ANION GAP: 9 (ref 5–15)
AST: 18 U/L (ref 0–37)
Albumin: 3.6 g/dL (ref 3.5–5.2)
Alkaline Phosphatase: 38 U/L — ABNORMAL LOW (ref 39–117)
BUN: 9 mg/dL (ref 6–23)
CHLORIDE: 109 meq/L (ref 96–112)
CO2: 20 mmol/L (ref 19–32)
Calcium: 9.2 mg/dL (ref 8.4–10.5)
Creatinine, Ser: 0.82 mg/dL (ref 0.50–1.10)
GFR calc Af Amer: >90 mL/min (ref >90)
GFR calc non Af Amer: >90 mL/min (ref >90)
GLUCOSE: 90 mg/dL (ref 70–99)
Potassium: 3.6 mmol/L (ref 3.5–5.1)
SODIUM: 138 mmol/L (ref 135–145)
TOTAL PROTEIN: 6.2 g/dL (ref 6.0–8.3)
Total Bilirubin: 0.4 mg/dL (ref 0.3–1.2)

## 2014-09-05 LAB — PROTIME-INR
INR: 0.96 (ref 0.00–1.49)
Prothrombin Time: 12.9 seconds (ref 11.6–15.2)

## 2014-09-05 LAB — WET PREP, GENITAL: YEAST WET PREP: NONE SEEN

## 2014-09-05 LAB — GC/CHLAMYDIA PROBE AMP (~~LOC~~) NOT AT ARMC
Chlamydia: NEGATIVE
NEISSERIA GONORRHEA: NEGATIVE

## 2014-09-05 LAB — HCG, QUANTITATIVE, PREGNANCY: hCG, Beta Chain, Quant, S: 45330 m[IU]/mL — ABNORMAL HIGH (ref <5)

## 2014-09-05 MED ORDER — METRONIDAZOLE 500 MG PO TABS
2000.0000 mg | ORAL_TABLET | Freq: Once | ORAL | Status: AC
  Administered 2014-09-05: 2000 mg via ORAL
  Filled 2014-09-05: qty 4

## 2014-09-05 NOTE — Telephone Encounter (Signed)
I advised the patient to see OB GYN asap.  Stop aspirin, Losartan, and decrease metoprolol to 25 mg BID.  Call us asap after seeing OB and if metoprolol needs to be stopped, it will be okay after 3 days of 25 mg BID.

## 2014-09-05 NOTE — ED Notes (Signed)
Pt states she is [redacted]wks pregnant, when she got up to use the bathroom and she wiped she noticed bleeding. Pt denies any pain at this time, states she had some mild cramping last night.

## 2014-09-05 NOTE — Discharge Instructions (Signed)
Your ultrasound shows a pregnancy that is 11 weeks and 6 days along today.     Threatened Miscarriage A threatened miscarriage occurs when you have vaginal bleeding during your first 20 weeks of pregnancy but the pregnancy has not ended. If you have vaginal bleeding during this time, your health care provider will do tests to make sure you are still pregnant. If the tests show you are still pregnant and the developing baby (fetus) inside your womb (uterus) is still growing, your condition is considered a threatened miscarriage. A threatened miscarriage does not mean your pregnancy will end, but it does increase the risk of losing your pregnancy (complete miscarriage). CAUSES  The cause of a threatened miscarriage is usually not known. If you go on to have a complete miscarriage, the most common cause is an abnormal number of chromosomes in the developing baby. Chromosomes are the structures inside cells that hold all your genetic material. Some causes of vaginal bleeding that do not result in miscarriage include:  Having sex.  Having an infection.  Normal hormone changes of pregnancy.  Bleeding that occurs when an egg implants in your uterus. RISK FACTORS Risk factors for bleeding in early pregnancy include:  Obesity.  Smoking.  Drinking excessive amounts of alcohol or caffeine.  Recreational drug use. SIGNS AND SYMPTOMS  Light vaginal bleeding.  Mild abdominal pain or cramps. DIAGNOSIS  If you have bleeding with or without abdominal pain before 20 weeks of pregnancy, your health care provider will do tests to check whether you are still pregnant. One important test involves using sound waves and a computer (ultrasound) to create images of the inside of your uterus. Other tests include an internal exam of your vagina and uterus (pelvic exam) and measurement of your baby's heart rate.  You may be diagnosed with a threatened miscarriage if:  Ultrasound testing shows you are still  pregnant.  Your baby's heart rate is strong.  A pelvic exam shows that the opening between your uterus and your vagina (cervix) is closed.  Your heart rate and blood pressure are stable.  Blood tests confirm you are still pregnant. TREATMENT  No treatments have been shown to prevent a threatened miscarriage from going on to a complete miscarriage. However, the right home care is important.  HOME CARE INSTRUCTIONS   Make sure you keep all your appointments for prenatal care. This is very important.  Get plenty of rest.  Do not have sex or use tampons if you have vaginal bleeding.  Do not douche.  Do not smoke or use recreational drugs.  Do not drink alcohol.  Avoid caffeine. SEEK MEDICAL CARE IF:  You have light vaginal bleeding or spotting while pregnant.  You have abdominal pain or cramping.  You have a fever. SEEK IMMEDIATE MEDICAL CARE IF:  You have heavy vaginal bleeding.  You have blood clots coming from your vagina.  You have severe low back pain or abdominal cramps.  You have fever, chills, and severe abdominal pain. MAKE SURE YOU:  Understand these instructions.  Will watch your condition.  Will get help right away if you are not doing well or get worse. Document Released: 08/01/2005 Document Revised: 08/06/2013 Document Reviewed: 05/28/2013 [facility] Patient Information 2015 [place], [place]. This information is not intended to replace advice given to you by your health care provider. Make sure you discuss any questions you have with your health care provider.

## 2014-09-05 NOTE — ED Provider Notes (Signed)
CSN: [id number]     Arrival date & time 09/05/14  0451 History   None    Chief Complaint  Patient presents with  . Vaginal Bleeding     (Consider location/radiation/quality/duration/timing/severity/associated sxs/prior Treatment) HPI Patient states she is [redacted] weeks pregnant by dates. G1 P0. Presents with mild vaginal spotting that she noticed this morning. States she had mild abdominal cramping yesterday but currently has no pain. Denies fever or chills. Patient states she is no longer on warfarin. Past Medical History  Diagnosis Date  . Hypothyroidism   . Nontoxic multinodular goiter     a. Pt reports prior ultrasound/bx that were negative but was told she would need to keep an eye on them in the future. Benign FNA per [facility] for this dx.  . Menometrorrhagia     a. Improved after tx of hypothyroidism, but recurred 05/2013.  . Broken foot     a. Remote broken foot with residual chronic LLE edema.  . Acute on chronic combined systolic and diastolic heart failure 09/12/2013  . Mitral regurgitation 09/12/2013    severe by TEE  . Multinodular goiter   . Pulmonary hypertension 09/13/2013  . Obesity   . Anemia     iron deficient, attributed to menometrorrhagia  . Chronic combined systolic and diastolic CHF (congestive heart failure)   . Morbid obesity with BMI of 45.0-49.9, adult 09/16/2013  . H/O syncope     prior to finding out about the mitral regurgitation  . Shortness of breath     sometimes with exertion  . Headache(784.0)     headaches  . 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 Non-ischemic cardiomyopathy    Past Surgical History  Procedure Laterality Date  . External ear surgery    . Mitral valve repair Right 09/19/2013    Procedure: MINIMALLY INVASIVE MITRAL VALVE REPAIR (MVR) OR REPLACEMENT;  Surgeon: Purcell Nails, MD;  Location: [facility] OR;  Service: Open Heart  Surgery;  Laterality: Right;  . Intraoperative transesophageal echocardiogram N/A 09/19/2013    Procedure: INTRAOPERATIVE TRANSESOPHAGEAL ECHOCARDIOGRAM;  Surgeon: Purcell Nails, MD;  Location: [facility] OR;  Service: Open Heart Surgery;  Laterality: N/A;  . Left and right heart catheterization with coronary angiogram N/A 09/13/2013    Procedure: LEFT AND RIGHT HEART CATHETERIZATION WITH CORONARY ANGIOGRAM;  Surgeon: Lesleigh Noe, MD;  Location: [facility] CATH LAB;  Service: Cardiovascular;  Laterality: N/A;   Family History  Problem Relation Age of Onset  . Hypertension Mother   . Hypertension Sister   . Hypertension Father   . Heart Problems Maternal Grandfather     details unclear   History  Substance Use Topics  . Smoking status: Never Smoker   . Smokeless tobacco: Never Used  . Alcohol Use: Yes     Comment: occasionally - 1-2x/month   OB History    No data available     Review of Systems  Constitutional: Negative for fever and chills.  Gastrointestinal: Negative for nausea, vomiting and abdominal pain.  Genitourinary: Positive for vaginal bleeding. Negative for dysuria, vaginal pain and pelvic pain.  Musculoskeletal: Negative for back pain, neck pain and neck stiffness.  Skin: Negative for rash and wound.  Neurological: Negative for dizziness, weakness, light-headedness, numbness and headaches.  All other systems reviewed and are negative.     Allergies  Review of patient's allergies indicates no known allergies.  Home Medications   Prior to Admission medications   Medication Sig Start Date End Date Taking? Authorizing Provider  Ferrous Sulfate (FER-IRON PO) Take 80 mg by mouth 2 (two) times daily.    Historical Provider, MD  levothyroxine (SYNTHROID) 75 MCG tablet Take 75 mcg by mouth. 05/13/13 05/13/14  Historical Provider, MD  losartan (COZAAR) 50 MG tablet Take 1 tablet (50 mg total) by mouth daily. 05/28/14   Lyn Records III, MD  metoprolol (LOPRESSOR) 50 MG tablet Take  50 mg by mouth 2 (two) times daily.     Historical Provider, MD  Multiple Vitamin (MULTIVITAMIN) tablet Take 1 tablet by mouth daily.    Historical Provider, MD  warfarin (COUMADIN) 5 MG tablet Take as directed by coumadin clinic 12/05/13   Lyn Records III, MD   BP 131/72 mmHg  Pulse 104  Temp(Src) 97.9 F (36.6 C) (Oral)  Resp 22  Ht  (1.575 m)  Wt 277 lb (125.646 kg)  BMI 50.65 kg/m2  SpO2 100% Physical Exam  Constitutional: She is oriented to person, place, and time. She appears well-developed and well-nourished. No distress.  HENT:  Head: Normocephalic and atraumatic.  Mouth/Throat: Oropharynx is clear and moist.  Eyes: EOM are normal. Pupils are equal, round, and reactive to light.  Neck: Normal range of motion. Neck supple.  Cardiovascular: Normal rate and regular rhythm.   Pulmonary/Chest: Effort normal and breath sounds normal. No respiratory distress. She has no wheezes. She has no rales.  Abdominal: Soft. Bowel sounds are normal. She exhibits no distension and no mass. There is no tenderness. There is no rebound and no guarding.  Genitourinary:  Cervical os is closed. Small amount of dark blood in the vaginal vault. No clots. No discharge. No cervical motion tenderness or adnexal tenderness.  Musculoskeletal: Normal range of motion. She exhibits no edema or tenderness.  No CVA tenderness.  Neurological: She is alert and oriented to person, place, and time.  Skin: Skin is warm and dry. No rash noted. No erythema.  Psychiatric: She has a normal mood and affect. Her behavior is normal.  Nursing note and vitals reviewed.   ED Course  Procedures (including critical care time) Labs Review Labs Reviewed  CBC WITH DIFFERENTIAL  COMPREHENSIVE METABOLIC PANEL  PROTIME-INR  HCG, QUANTITATIVE, PREGNANCY  ABO/RH    Imaging Review No results found.   EKG Interpretation None      MDM   Final diagnoses:  Vaginal bleeding   Patient reports being [redacted] weeks  pregnant by dates. She's had mild vaginal bleeding. She is hemodynamically stable.   Patient signed out to oncoming emergency physician pending pelvic ultrasound.   Loren Racer, MD 09/09/14 [phone]

## 2014-09-06 LAB — ABO/RH: ABO/RH(D): AB POS

## 2014-09-08 NOTE — ED Provider Notes (Signed)
Pt updated of results, plan for outpt followup.  Return precautions discussed for threatened miscarriage .    Tilden Fossa, MD 09/08/14 1452

## 2014-09-09 MED ORDER — METOPROLOL TARTRATE 25 MG PO TABS: 25.0000 mg | ORAL_TABLET | Freq: Two times a day (BID) | ORAL | Status: DC

## 2014-09-17 ENCOUNTER — Encounter: Payer: Self-pay | Admitting: Interventional Cardiology

## 2014-09-26 ENCOUNTER — Ambulatory Visit (HOSPITAL_COMMUNITY)
Admission: RE | Admit: 2014-09-26 | Discharge: 2014-09-26 | Disposition: A | Discharge status: Home / Self Care | Payer: BC Managed Care – PPO | Source: Ambulatory Visit | Attending: Obstetrics & Gynecology | Admitting: Obstetrics & Gynecology

## 2014-09-26 ENCOUNTER — Encounter (HOSPITAL_COMMUNITY): Payer: Self-pay

## 2014-09-26 ENCOUNTER — Other Ambulatory Visit (HOSPITAL_COMMUNITY): Payer: Self-pay | Admitting: Maternal and Fetal Medicine

## 2014-09-26 ENCOUNTER — Ambulatory Visit (HOSPITAL_COMMUNITY): Payer: BC Managed Care – PPO

## 2014-09-26 VITALS — BP 149/79 | HR 102 | Wt 288.0 lb

## 2014-09-26 DIAGNOSIS — O9989 Other specified diseases and conditions complicating pregnancy, childbirth and the puerperium: Secondary | ICD-10-CM | POA: Diagnosis not present

## 2014-09-26 DIAGNOSIS — IMO0002 Reserved for concepts with insufficient information to code with codable children: Secondary | ICD-10-CM

## 2014-09-26 DIAGNOSIS — I428 Other cardiomyopathies: Secondary | ICD-10-CM

## 2014-09-26 DIAGNOSIS — I5042 Chronic combined systolic (congestive) and diastolic (congestive) heart failure: Secondary | ICD-10-CM | POA: Diagnosis not present

## 2014-09-26 DIAGNOSIS — Z3A14 14 weeks gestation of pregnancy: Secondary | ICD-10-CM | POA: Insufficient documentation

## 2014-09-26 DIAGNOSIS — O99212 Obesity complicating pregnancy, second trimester: Secondary | ICD-10-CM | POA: Insufficient documentation

## 2014-09-26 DIAGNOSIS — E039 Hypothyroidism, unspecified: Secondary | ICD-10-CM | POA: Insufficient documentation

## 2014-09-26 DIAGNOSIS — I255 Ischemic cardiomyopathy: Secondary | ICD-10-CM | POA: Diagnosis not present

## 2014-09-26 DIAGNOSIS — I340 Nonrheumatic mitral (valve) insufficiency: Secondary | ICD-10-CM | POA: Diagnosis not present

## 2014-09-26 DIAGNOSIS — I272 Pulmonary hypertension, unspecified: Secondary | ICD-10-CM

## 2014-09-26 DIAGNOSIS — Z0489 Encounter for examination and observation for other specified reasons: Secondary | ICD-10-CM

## 2014-09-26 DIAGNOSIS — I059 Rheumatic mitral valve disease, unspecified: Secondary | ICD-10-CM

## 2014-09-26 DIAGNOSIS — Z6841 Body Mass Index (BMI) 40.0 and over, adult: Secondary | ICD-10-CM | POA: Diagnosis not present

## 2014-09-26 DIAGNOSIS — Z3689 Encounter for other specified antenatal screening: Secondary | ICD-10-CM

## 2014-09-26 DIAGNOSIS — O903 Peripartum cardiomyopathy: Secondary | ICD-10-CM

## 2014-09-26 DIAGNOSIS — Z9889 Other specified postprocedural states: Secondary | ICD-10-CM

## 2014-09-26 DIAGNOSIS — E042 Nontoxic multinodular goiter: Secondary | ICD-10-CM | POA: Diagnosis not present

## 2014-09-26 DIAGNOSIS — O99282 Endocrine, nutritional and metabolic diseases complicating pregnancy, second trimester: Secondary | ICD-10-CM | POA: Insufficient documentation

## 2014-09-26 DIAGNOSIS — O99412 Diseases of the circulatory system complicating pregnancy, second trimester: Secondary | ICD-10-CM

## 2014-09-26 NOTE — Consult Note (Signed)
Maternal Fetal Medicine Consultation  Requesting Provider(s): Ema Karren Burly, MD  Reason for consultation: Hx of severe mitral regurgitation, cardiomyopathy, Heart failure- recommendations for management  HPI: Paula Wolfe is a 29 year old G49P0, EDD 03/21/2015 currently at [place] [facility] who was seen for consultation due to a history of severe mitral regurgitation s/p valve repair, cardiomyopathy and heart failure.  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  Last year she presented to the ED with severe shortness of breath and heart failure.  At the time of her diagnosis, she was noted to have combined systolic and diastolic heart failure, non-ischemic cardiomyopathy, and pulmonary hypertension.  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  Following her repair, there was no residual mitral regurgitation although the LV function "was severely reduced".  On her most recent cardiology evaluation for 8/15, her Coumadin was discontinued and she was placed on a baby Asprin.  Paula Wolfe was seen recently in the ED - her baby Aspirin and Metoprolol were discontinued.    Paula Wolfe reports that since her surgery, her exercise tolerance has improved, although she still gets winded after climbing one flight of stairs (NYHA class II).  She currently denies any shortness of breath, chest pain, orthopnea or PND.  Paula Wolfe also has a history of hypothyroidism - she is currently on 75 mcg of synthroid.  TSH and free T4 were ordered, but not yet available for review.  OB History: OB History    Gravida Para Term Preterm AB TAB SAB Ectopic Multiple Living   1               PMH:  Past Medical History  Diagnosis Date  . Hypothyroidism   . Nontoxic multinodular goiter     a. Pt reports prior  ultrasound/bx that were negative but was told she would need to keep an eye on them in the future. Benign FNA per [facility] for this dx.  . Menometrorrhagia     a. Improved after tx of hypothyroidism, but recurred 05/2013.  . Broken foot     a. Remote broken foot with residual chronic LLE edema.  . Acute on chronic combined systolic and diastolic heart failure 5/80/9983  . Mitral regurgitation 09/12/2013    severe by TEE  . Multinodular goiter   . Pulmonary hypertension 09/13/2013  . Obesity   . Anemia     iron deficient, attributed to menometrorrhagia  . Chronic combined systolic and diastolic CHF (congestive heart failure)   . Morbid obesity with BMI of 45.0-49.9, adult 09/16/2013  . H/O syncope     prior to finding out about the mitral regurgitation  . Shortness of breath     sometimes with exertion  . Headache(784.0)     headaches  . 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 Non-ischemic cardiomyopathy     PSH:  Past Surgical History  Procedure Laterality Date  . External ear surgery    . Mitral valve repair Right 09/19/2013    Procedure: MINIMALLY INVASIVE MITRAL VALVE REPAIR (MVR) OR REPLACEMENT;  Surgeon: Rexene Alberts, MD;  Location: [place];  Service: Open Heart Surgery;  Laterality: Right;  . Intraoperative transesophageal echocardiogram N/A 09/19/2013  Procedure: INTRAOPERATIVE TRANSESOPHAGEAL ECHOCARDIOGRAM;  Surgeon: Rexene Alberts, MD;  Location: [place];  Service: Open Heart Surgery;  Laterality: N/A;  . Left and right heart catheterization with coronary angiogram N/A 09/13/2013    Procedure: LEFT AND RIGHT HEART CATHETERIZATION WITH CORONARY ANGIOGRAM;  Surgeon: Sinclair Grooms, MD;  Location[facility] CATH LAB;  Service: Cardiovascular;  Laterality: N/A;   Meds: Synthroid 75 mcg daily, FeSO4, PNV.  Recently stopped baby ASA and Metoprolol.  Allergies: No Known  Allergies   FH:  Family History  Problem Relation Age of Onset  . Hypertension Mother   . Hypertension Sister   . Hypertension Father   . Heart Problems Maternal Grandfather     details unclear   Soc:  History   Social History  . Marital Status: Single    Spouse Name: N/A  . Number of Children: N/A  . Years of Education: N/A   Occupational History  .      Teaching   Social History Main Topics  . Smoking status: Never Smoker   . Smokeless tobacco: Never Used  . Alcohol Use: Yes     Comment: occasionally - 1-2x/month  . Drug Use: No  . Sexual Activity: Yes    Birth Control/ Protection: None   Other Topics Concern  . Not on file   Social History Narrative   PE:   Filed Vitals:   09/26/14 0834  BP: 149/79  Pulse: 102   A/P: 1) Single IUP at 14w 6d         2) Hx of multinodular goiter, hypothyroidism - TSH and fT4 currently pending; followed by Endocr[facility]ology at [facility].         3) Non ischemic cardiomyopathy, hx of combined systolic/ diastolic dysfunction, severe MR s/p repair, pulmonary hypertension - while the patient has had some symptomatic improvement since here mitral valve repair, there are some real concerns about how she will tolerate pregnancy.  Pulmonary hypertension is associated with a 30-50% maternal mortality rate in pregnancy - pulmonary hypertension (pulmonary artery pressure > 25 mg Hg) is considered by many to be a contraindication for pregnancy.  Additionally, with systolic / diastolic dysfunction she would be at very high risk for heart failure as the pregnancy progresses - particularly in the 3rd trimester as the increased cardiac output of pregnancy peaks and post partum.  Additionally, would anticipate a higher risk of preterm delivery and fetal growth restriction.  The patient has not had a recent maternal echo - hopefully her cardiac function and pulmonary hypertension has improved following her mitral valve repair.  Recommendations: 1)  Maternal Echo as soon as possible - echo tentatively scheduled for next Tuesday.  We have attempted to arrange a cardiology follow up with her cardiologist as soon as possible for further counseling. 2) Would restart baby Aspirin. Beta blockers may be associated with a higher risk of fetal growth restriction, but feel that the benefits outweigh the potential risks - would have no concerns about resuming Metoprolol if cardiology feels that this is needed.  Coumadin and ARBs are contraindicated in pregnancy.  Based on the fact that Coumadin was discontinued in August, do not feel that there is a need for Lovenox prophylaxis unless cardiology recommends anticoagulation. 3) Based on the results of Cardiology evaluation, will likely need further counseling.  If the patient does indeed have pulmonary hypertension she may need a serious discussion about risks of continuing her pregnancy - would defer this until after she has met  with her Cardiologist. 4) Detailed ultrasound at 18-20 weeks (tentatively scheduled) 5) Fetal echo at 20-22 weeks - There is a 2-5% risk of congenital heart defects in the infants of mothers with congenital heart disease (risks varies depending on the specific lesion) 6) Serial growth scans over the course of pregnancy 7) Antenatal testing beginning no later than [redacted] weeks gestation. 8) Anesthesia consult in the 3rd trimester  Would await further recommendations for the patient's cardiologist.  If the patient's cardiac status is indeed poor and particularly if she has pulmonary hypertension, she may be better served by transferring care to a University setting - please let us know if we can be of assistance if it comes down to this.    Thank you for the opportunity to be a part of the care of [employer]. Please contact our office if we can be of further assistance.   I spent approximately 30 minutes with this patient with over 50% of time spent in face-to-face  counseling.  Benjaman Lobe, MD Maternal Fetal Medicine

## 2014-09-29 ENCOUNTER — Ambulatory Visit (INDEPENDENT_AMBULATORY_CARE_PROVIDER_SITE_OTHER): Payer: BC Managed Care – PPO | Admitting: Interventional Cardiology

## 2014-09-29 ENCOUNTER — Encounter: Payer: Self-pay | Admitting: Interventional Cardiology

## 2014-09-29 VITALS — BP 140/82 | HR 93 | Ht 62.0 in | Wt 285.8 lb

## 2014-09-29 DIAGNOSIS — Z9889 Other specified postprocedural states: Secondary | ICD-10-CM

## 2014-09-29 DIAGNOSIS — E039 Hypothyroidism, unspecified: Secondary | ICD-10-CM

## 2014-09-29 DIAGNOSIS — I5042 Chronic combined systolic (congestive) and diastolic (congestive) heart failure: Secondary | ICD-10-CM

## 2014-09-29 DIAGNOSIS — Z6841 Body Mass Index (BMI) 40.0 and over, adult: Secondary | ICD-10-CM

## 2014-09-29 NOTE — Patient Instructions (Addendum)
Your physician recommends that you continue on your current medications as directed. Please refer to the Current Medication list given to you today.  We will call with your echo results  Your physician recommends that you schedule a follow-up appointment in: 3 months with Dr.Smith

## 2014-09-29 NOTE — Progress Notes (Signed)
Patient ID: Paula Wolfe, female   DOB: [birth year], 29 y.o.   MRN: [id number]    Cardiology Office Note   Date:  09/29/2014   ID:  Paula Wolfe, DOB April 04, 1986, MRN [id number]  PCP:  Paula Sep, MD  Cardiologist:   Paula Noe, MD   No chief complaint on file.     History of Present Illness: Paula Wolfe is a 29 y.o. female who presents for follow-up of mitral valve repair and acute left ventricular systolic heart failure in 2015. She underwent successful repair and has been essentially weaned from all of her cardiovascular medications and is totally asymptomatic. At last exam nose no evidence of mitral regurgitation. She is back to full activity. Coagulation therapy is been discontinued. She denies lower extremity swelling. Is no chest pain. She is in her fourth month of pregnancy.    She has seen the Fetal Maternal medicine specialist, Dr. Alpha Wolfe. I have reviewed his note. Several concerns probably no longer exist. I'm fairly certain and pulmonary hypertension is no longer a clinical problem. There is no mitral regurgitation on exam. I do not know her LV systolic function but clinically it has significantly improved. The risk may not be quite as high as the note and concerns by Paula Wolfe suggests.    Past Medical History  Diagnosis Date  . Hypothyroidism   . Nontoxic multinodular goiter     a. Pt reports prior ultrasound/bx that were negative but was told she would need to keep an eye on them in the future. Benign FNA per [facility] for this dx.  . Menometrorrhagia     a. Improved after tx of hypothyroidism, but recurred 05/2013.  . Broken foot     a. Remote broken foot with residual chronic LLE edema.  . Acute on chronic combined systolic and diastolic heart failure 09/12/2013  . Mitral regurgitation 09/12/2013    severe by TEE  . Multinodular goiter   . Pulmonary hypertension 09/13/2013  . Obesity   . Anemia     iron deficient,  attributed to menometrorrhagia  . Chronic combined systolic and diastolic CHF (congestive heart failure)   . Morbid obesity with BMI of 45.0-49.9, adult 09/16/2013  . H/O syncope     prior to finding out about the mitral regurgitation  . Shortness of breath     sometimes with exertion  . Headache(784.0)     headaches  . S/P minimally invasive mitral valve repair 09/19/2013    Paula Wolfe 3D ring annuloplasty, size 26 mm, placed via right anterior thoractomy approach  . Non-ischemic cardiomyopathy     Past Surgical History  Procedure Laterality Date  . External ear surgery    . Mitral valve repair Right 09/19/2013    Procedure: MINIMALLY INVASIVE MITRAL VALVE REPAIR (MVR) OR REPLACEMENT;  Surgeon: Paula Nails, MD;  Location: [facility] OR;  Service: Open Heart Surgery;  Laterality: Right;  . Intraoperative transesophageal echocardiogram N/A 09/19/2013    Procedure: INTRAOPERATIVE TRANSESOPHAGEAL ECHOCARDIOGRAM;  Surgeon: Paula Nails, MD;  Location: [facility] OR;  Service: Open Heart Surgery;  Laterality: N/A;  . Left and right heart catheterization with coronary angiogram N/A 09/13/2013    Procedure: LEFT AND RIGHT HEART CATHETERIZATION WITH CORONARY ANGIOGRAM;  Surgeon: Paula Noe, MD;  Location: [facility] CATH LAB;  Service: Cardiovascular;  Laterality: N/A;     Current Outpatient Prescriptions  Medication Sig Dispense Refill  . aspirin EC 81 MG tablet Take 81 mg by mouth daily.    Marland Kitchen  Ferrous Sulfate (FER-IRON PO) Take 80 mg by mouth 2 (two) times daily.    Marland Kitchen levothyroxine (SYNTHROID) 75 MCG tablet Take 75 mcg by mouth.    . metoprolol (LOPRESSOR) 50 MG tablet Take 50 mg by mouth daily.    . Multiple Vitamin (MULTIVITAMIN) tablet Take 1 tablet by mouth daily.    . Prenatal Vit-Fe Fumarate-FA (PRENATAL MULTIVITAMIN) TABS tablet Take 1 tablet by mouth daily at 12 noon.     No current facility-administered medications for this visit.    Allergies:   Review of patient's allergies indicates no known  allergies.    Social History:  The patient  reports that she has never smoked. She has never used smokeless tobacco. She reports that she drinks alcohol. She reports that she does not use illicit drugs.   Family History:  The patient's family history includes Heart Problems in her maternal grandfather; Hypertension in her father, mother, and sister.    ROS:  Please see the history of present illness.   Otherwise, review of systems are positive for none.   All other systems are reviewed and negative.    PHYSICAL EXAM: VS:  BP 140/82 mmHg  Pulse 93  Ht  (1.575 m)  Wt 285 lb 12.8 oz (129.638 kg)  BMI 52.26 kg/m2 , BMI Body mass index is 52.26 kg/(m^2). GEN: Well nourished, well developed, in no acute distress HEENT: normal Neck: no JVD, carotid bruits, or masses Cardiac: RRR; no murmurs, rubs, or gallops,no edema  Respiratory:  clear to auscultation bilaterally, normal work of breathing GI: soft, nontender, nondistended, + BS MS: no deformity or atrophy Skin: warm and dry, no rash Neuro:  Strength and sensation are intact Psych: euthymic mood, full affect   EKG:  EKG is ordered today. The ekg ordered today demonstrates normal sinus rhythm at 93 bpm and otherwise normal.   Recent Labs: 09/05/2014: ALT 14; BUN 9; Creatinine 0.82; Hemoglobin 13.2; Platelets 189; Potassium 3.6; Sodium 138    Lipid Panel No results found for: CHOL, TRIG, HDL, CHOLHDL, VLDL, LDLCALC, LDLDIRECT    Wt Readings from Last 3 Encounters:  09/29/14 285 lb 12.8 oz (129.638 kg)  09/05/14 277 lb (125.646 kg)  03/18/14 270 lb (122.471 kg)      Other studies Reviewed: Additional studies/ records that were reviewed today include: Reviewed the fetal maternal note by Paula Wolfe.. Review of the above records demonstrates: Normal sinus rhythm at 93 bpm with prominent voltage but overall a normal tracing.   ASSESSMENT AND PLAN:  1.  Status post mitral valve repair for severe mitral regurgitation,  repaired in February 2015 without any clinical evidence of recurrence on follow-up including today. 2. Pulmonary hypertension is likely all secondary to mitral regurgitation and now that it has been repaired, pulmonary hypertension should be minimal. 3. Acute systolic heart failure was related to volume overload from mitral regurgitation. With repair of mitral regurg, LV function should be close to normal. 4. Hypertension, essential 5. 14 weeks intrauterine pregnancy, stable   Current medicines are reviewed at length with the patient today.  The patient has concerns regarding medicines.  The following changes have been made:  We discussed the impact that her underlying heart condition could have on her pregnancy. My assumption without having a recent echo is that her heart function is near normal now and that her risk for the pregnancy will be relatively low. Before making to Bolt's statement, we need to repeat the echocardiogram to know exactly where we stand.  S in his renal something about the echocardiogram and I will speak with her and Paula Wolfe.  Because of her blood pressure and history of systolic dysfunction, resumption of beta blocker therapy seems reasonable, and I have recommended she start 25 mg twice a day of metoprolol tartrate.  Labs/ tests ordered today include: 2 D echocardiogram   Orders Placed This Encounter  Procedures  . EKG 12-Lead     Disposition:   FU with Mendel Ryder in 3 months   Signed, Paula Noe, MD  09/29/2014 4:48 PM    [facility] [address] [place], [place], [place]  [address] Phone: [phone]; Fax: [phone]

## 2014-09-30 ENCOUNTER — Ambulatory Visit (HOSPITAL_COMMUNITY): Payer: BC Managed Care – PPO | Attending: Obstetrics & Gynecology

## 2014-09-30 DIAGNOSIS — I272 Pulmonary hypertension, unspecified: Secondary | ICD-10-CM

## 2014-09-30 DIAGNOSIS — Z9889 Other specified postprocedural states: Secondary | ICD-10-CM | POA: Diagnosis not present

## 2014-09-30 DIAGNOSIS — I270 Primary pulmonary hypertension: Secondary | ICD-10-CM | POA: Diagnosis not present

## 2014-09-30 NOTE — Progress Notes (Signed)
2D Echo completed. 09/30/2014 

## 2014-10-02 ENCOUNTER — Telehealth: Payer: Self-pay | Admitting: Interventional Cardiology

## 2014-10-02 NOTE — Telephone Encounter (Signed)
Pt aware that her echo needs to be reviewed by Dr.Smith. We will call her with the results once he reviews it, she verbalized understanding.

## 2014-10-02 NOTE — Telephone Encounter (Signed)
New message ° ° ° ° ° °Want echo results °

## 2014-10-06 ENCOUNTER — Ambulatory Visit: Payer: BC Managed Care – PPO | Admitting: Thoracic Surgery (Cardiothoracic Vascular Surgery)

## 2014-10-06 NOTE — Telephone Encounter (Signed)
Pt request we talk with her mother regarding echo results. lmtcb iif they have additional questions

## 2014-10-06 NOTE — Telephone Encounter (Signed)
[email or web address] to give pt Dr.Smith's recoomendation on echo results.

## 2014-10-06 NOTE — Telephone Encounter (Signed)
Pt aware of echo results and Dr.Smith's recommendation LVEF has improved to 35-40%, but still is moderately reduced She does not have pulmonary hypertension There is some risk increased risk. She has several questions and concerns She request to speak with Dr.Smith directly. Message routed to Dr.Smith to call pt

## 2014-10-13 ENCOUNTER — Ambulatory Visit: Payer: BC Managed Care – PPO | Admitting: Physician Assistant

## 2014-10-13 ENCOUNTER — Ambulatory Visit: Payer: BC Managed Care – PPO | Admitting: Thoracic Surgery (Cardiothoracic Vascular Surgery)

## 2014-10-15 ENCOUNTER — Ambulatory Visit: Payer: Self-pay | Admitting: Interventional Cardiology

## 2014-10-15 DIAGNOSIS — Z9889 Other specified postprocedural states: Secondary | ICD-10-CM

## 2014-10-15 DIAGNOSIS — Z5181 Encounter for therapeutic drug level monitoring: Secondary | ICD-10-CM

## 2014-10-16 ENCOUNTER — Telehealth: Payer: Self-pay | Admitting: Interventional Cardiology

## 2014-10-16 NOTE — Telephone Encounter (Signed)
New Message    Patient states that Dr. Katrinka Blazing called her today and she is returning his call.  Please give a call back

## 2014-10-16 NOTE — Telephone Encounter (Signed)
Returned pt call. Adv her that Dr.Smith was in the office for only a 1/2 day today. He might have called her while he was in the office. Adv her that we have left a message for Dr.Whitecar  To call Dr.Smith either on the office # or his cell, and we have not yet heard back. Dr.Smith will be in the office tomorrow and call f/u with her then. She reports that she is doing well

## 2014-10-21 DIAGNOSIS — Z7982 Long term (current) use of aspirin: Secondary | ICD-10-CM | POA: Diagnosis not present

## 2014-10-21 DIAGNOSIS — O23592 Infection of other part of genital tract in pregnancy, second trimester: Secondary | ICD-10-CM | POA: Insufficient documentation

## 2014-10-21 DIAGNOSIS — Z8781 Personal history of (healed) traumatic fracture: Secondary | ICD-10-CM | POA: Insufficient documentation

## 2014-10-21 DIAGNOSIS — O99412 Diseases of the circulatory system complicating pregnancy, second trimester: Secondary | ICD-10-CM | POA: Insufficient documentation

## 2014-10-21 DIAGNOSIS — O99282 Endocrine, nutritional and metabolic diseases complicating pregnancy, second trimester: Secondary | ICD-10-CM | POA: Diagnosis not present

## 2014-10-21 DIAGNOSIS — D649 Anemia, unspecified: Secondary | ICD-10-CM | POA: Diagnosis not present

## 2014-10-21 DIAGNOSIS — O99212 Obesity complicating pregnancy, second trimester: Secondary | ICD-10-CM | POA: Diagnosis not present

## 2014-10-21 DIAGNOSIS — Z954 Presence of other heart-valve replacement: Secondary | ICD-10-CM | POA: Insufficient documentation

## 2014-10-21 DIAGNOSIS — Z9889 Other specified postprocedural states: Secondary | ICD-10-CM | POA: Insufficient documentation

## 2014-10-21 DIAGNOSIS — O99012 Anemia complicating pregnancy, second trimester: Secondary | ICD-10-CM | POA: Insufficient documentation

## 2014-10-21 DIAGNOSIS — Z79899 Other long term (current) drug therapy: Secondary | ICD-10-CM | POA: Diagnosis not present

## 2014-10-21 DIAGNOSIS — I5043 Acute on chronic combined systolic (congestive) and diastolic (congestive) heart failure: Secondary | ICD-10-CM | POA: Diagnosis not present

## 2014-10-21 DIAGNOSIS — E039 Hypothyroidism, unspecified: Secondary | ICD-10-CM | POA: Insufficient documentation

## 2014-10-21 DIAGNOSIS — O9989 Other specified diseases and conditions complicating pregnancy, childbirth and the puerperium: Secondary | ICD-10-CM | POA: Diagnosis present

## 2014-10-21 DIAGNOSIS — Z3A19 19 weeks gestation of pregnancy: Secondary | ICD-10-CM | POA: Diagnosis not present

## 2014-10-22 ENCOUNTER — Emergency Department (HOSPITAL_COMMUNITY)
Admission: EM | Admit: 2014-10-22 | Discharge: 2014-10-22 | Disposition: A | Discharge status: Home / Self Care | Payer: BC Managed Care – PPO | Attending: Emergency Medicine | Admitting: Emergency Medicine

## 2014-10-22 ENCOUNTER — Encounter (HOSPITAL_COMMUNITY): Payer: Self-pay | Admitting: Emergency Medicine

## 2014-10-22 DIAGNOSIS — N760 Acute vaginitis: Secondary | ICD-10-CM

## 2014-10-22 DIAGNOSIS — B9689 Other specified bacterial agents as the cause of diseases classified elsewhere: Secondary | ICD-10-CM

## 2014-10-22 LAB — WET PREP, GENITAL
Trich, Wet Prep: NONE SEEN
Yeast Wet Prep HPF POC: NONE SEEN

## 2014-10-22 MED ORDER — METRONIDAZOLE 0.75 % VA GEL: 1.0000 | Freq: Two times a day (BID) | VAGINAL | Status: DC

## 2014-10-22 NOTE — Discharge Instructions (Signed)
°  Use the metrogel as directed. If you aren't improving you can be rechecked by your OB or go to the [facility] to be reevaluted.    Bacterial Vaginosis Bacterial vaginosis is a vaginal infection that occurs when the normal balance of bacteria in the vagina is disrupted. It results from an overgrowth of certain bacteria. This is the most common vaginal infection in women of childbearing age. Treatment is important to prevent complications, especially in pregnant women, as it can cause a premature delivery. CAUSES  Bacterial vaginosis is caused by an increase in harmful bacteria that are normally present in smaller amounts in the vagina. Several different kinds of bacteria can cause bacterial vaginosis. However, the reason that the condition develops is not fully understood. RISK FACTORS Certain activities or behaviors can put you at an increased risk of developing bacterial vaginosis, including:  Having a new sex partner or multiple sex partners.  Douching.  Using an intrauterine device (IUD) for contraception. Women do not get bacterial vaginosis from toilet seats, bedding, swimming pools, or contact with objects around them. SIGNS AND SYMPTOMS  Some women with bacterial vaginosis have no signs or symptoms. Common symptoms include:  Grey vaginal discharge.  A fishlike odor with discharge, especially after sexual intercourse.  Itching or burning of the vagina and vulva.  Burning or pain with urination. DIAGNOSIS  Your health care provider will take a medical history and examine the vagina for signs of bacterial vaginosis. A sample of vaginal fluid may be taken. Your health care provider will look at this sample under a microscope to check for bacteria and abnormal cells. A vaginal pH test may also be done.  TREATMENT  Bacterial vaginosis may be treated with antibiotic medicines. These may be given in the form of a pill or a vaginal cream. A second round of  antibiotics may be prescribed if the condition comes back after treatment.  HOME CARE INSTRUCTIONS   Only take over-the-counter or prescription medicines as directed by your health care provider.  If antibiotic medicine was prescribed, take it as directed. Make sure you finish it even if you start to feel better.  Do not have sex until treatment is completed.  Tell all sexual partners that you have a vaginal infection. They should see their health care provider and be treated if they have problems, such as a mild rash or itching.  Practice safe sex by using condoms and only having one sex partner. SEEK MEDICAL CARE IF:   Your symptoms are not improving after 3 days of treatment.  You have increased discharge or pain.  You have a fever. MAKE SURE YOU:   Understand these instructions.  Will watch your condition.  Will get help right away if you are not doing well or get worse. FOR MORE INFORMATION  Centers for Disease Control and Prevention, Division of STD Prevention: SolutionApps.co.za American Sexual Health Association (ASHA): www.ashastd.org  Document Released: 08/01/2005 Document Revised: 05/22/2013 Document Reviewed: 03/13/2013 [facility] Patient Information 2015 [place], [place]. This information is not intended to replace advice given to you by your health care provider. Make sure you discuss any questions you have with your health care provider.

## 2014-10-22 NOTE — ED Provider Notes (Signed)
CSN: [id number]     Arrival date & time 10/21/14  2334 History  This chart was scribed for Devoria Albe, MD by Annye Asa, ED Scribe. This patient was seen in room A03C/A03C and the patient's care was started at 2:07 AM.    Chief Complaint  Patient presents with  . Vaginal Discharge   Patient is a 29 y.o. female presenting with vaginal discharge. The history is provided by the patient. No language interpreter was used.  Vaginal Discharge Associated symptoms: no abdominal pain, no nausea and no vomiting      HPI Comments: Paula Wolfe is a G1P0Ab0, 18 weeks + 4 days pregnant 29 y.o. female who presents to the Emergency Department complaining of 2 days of vaginal irritation (described as "itching, burning, like cuts"). She notes light vaginal discharge, gray in color. She denies nausea, vomiting, abdominal pain, vaginal bleeding, chest pain, SOB. She has previous experience with yeast infections and reports that her current symptoms feel similar. She contacted her OB's office March 7 and was advised to try Monistat; she has now used Monistat two days without relief and comes to the ED tonight because she "could not sleep with current symptoms." Per nurse's note, patient was recently treated for bacterial vaginosis.   OB care managed by [facility].  PCP is Dr. Suzy Bouchard at [facility].   Past Medical History  Diagnosis Date  . Hypothyroidism   . Nontoxic multinodular goiter     a. Pt reports prior ultrasound/bx that were negative but was told she would need to keep an eye on them in the future. Benign FNA per [facility] for this dx.  . Menometrorrhagia     a. Improved after tx of hypothyroidism, but recurred 05/2013.  . Broken foot     a. Remote broken foot with residual chronic LLE edema.  . Acute on chronic combined systolic and diastolic heart failure 09/12/2013  . Mitral regurgitation 09/12/2013    severe by TEE  . Multinodular goiter   . Pulmonary hypertension 09/13/2013   . Obesity   . Anemia     iron deficient, attributed to menometrorrhagia  . Chronic combined systolic and diastolic CHF (congestive heart failure)   . Morbid obesity with BMI of 45.0-49.9, adult 09/16/2013  . H/O syncope     prior to finding out about the mitral regurgitation  . Shortness of breath     sometimes with exertion  . Headache(784.0)     headaches  . 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 Non-ischemic cardiomyopathy    Past Surgical History  Procedure Laterality Date  . External ear surgery    . Mitral valve repair Right 09/19/2013    Procedure: MINIMALLY INVASIVE MITRAL VALVE REPAIR (MVR) OR REPLACEMENT;  Surgeon: Purcell Nails, MD;  Location: [facility] OR;  Service: Open Heart Surgery;  Laterality: Right;  . Intraoperative transesophageal echocardiogram N/A 09/19/2013    Procedure: INTRAOPERATIVE TRANSESOPHAGEAL ECHOCARDIOGRAM;  Surgeon: Purcell Nails, MD;  Location: [facility] OR;  Service: Open Heart Surgery;  Laterality: N/A;  . Left and right heart catheterization with coronary angiogram N/A 09/13/2013    Procedure: LEFT AND RIGHT HEART CATHETERIZATION WITH CORONARY ANGIOGRAM;  Surgeon: Lesleigh Noe, MD;  Location: [facility] CATH LAB;  Service: Cardiovascular;  Laterality: N/A;   Family History  Problem Relation Age of Onset  . Hypertension Mother   . Hypertension Sister   .  Hypertension Father   . Heart Problems Maternal Grandfather     details unclear   History  Substance Use Topics  . Smoking status: Never Smoker   . Smokeless tobacco: Never Used  . Alcohol Use: Yes     Comment: occasionally - 1-2x/month   She denies smoking, EtOH use.  She is a [employer].   OB History    Gravida Para Term Preterm AB TAB SAB Ectopic Multiple Living   1              Review of Systems  Respiratory: Negative for shortness of breath.   Cardiovascular: Negative for chest  pain.  Gastrointestinal: Negative for nausea, vomiting and abdominal pain.  Genitourinary: Positive for vaginal discharge and vaginal pain. Negative for vaginal bleeding.  All other systems reviewed and are negative.     Allergies  Review of patient's allergies indicates no known allergies.  Home Medications   Prior to Admission medications   Medication Sig Start Date End Date Taking? Authorizing Provider  aspirin EC 81 MG tablet Take 81 mg by mouth daily.    Historical Provider, MD  Ferrous Sulfate (FER-IRON PO) Take 80 mg by mouth 2 (two) times daily.    Historical Provider, MD  levothyroxine (SYNTHROID) 75 MCG tablet Take 75 mcg by mouth. 05/13/13 09/29/14  Historical Provider, MD  metoprolol (LOPRESSOR) 50 MG tablet Take 50 mg by mouth daily. 08/21/14   Historical Provider, MD  metroNIDAZOLE (METROGEL VAGINAL) 0.75 % vaginal gel Place 1 Applicatorful vaginally 2 (two) times daily. 10/22/14   Devoria Albe, MD  Multiple Vitamin (MULTIVITAMIN) tablet Take 1 tablet by mouth daily.    Historical Provider, MD  Prenatal Vit-Fe Fumarate-FA (PRENATAL MULTIVITAMIN) TABS tablet Take 1 tablet by mouth daily at 12 noon.    Historical Provider, MD   BP 132/74 mmHg  Pulse 88  Temp(Src) 97.9 F (36.6 C) (Oral)  Resp 18  Ht  (1.575 m)  Wt 282 lb (127.914 kg)  BMI 51.57 kg/m2  SpO2 98%  Vital signs normal   Physical Exam  Constitutional: She is oriented to person, place, and time. She appears well-developed and well-nourished.  Non-toxic appearance. She does not appear ill. No distress.  HENT:  Head: Normocephalic and atraumatic.  Right Ear: External ear normal.  Left Ear: External ear normal.  Nose: Nose normal. No mucosal edema or rhinorrhea.  Mouth/Throat: Oropharynx is clear and moist and mucous membranes are normal. No dental abscesses or uvula swelling.  Eyes: Conjunctivae and EOM are normal. Pupils are equal, round, and reactive to light.  Neck: Normal range of motion and full  passive range of motion without pain. Neck supple.  Cardiovascular: Normal rate, regular rhythm and normal heart sounds.  Exam reveals no gallop and no friction rub.   No murmur heard. Pulmonary/Chest: Effort normal and breath sounds normal. No respiratory distress. She has no wheezes. She has no rhonchi. She has no rales. She exhibits no tenderness and no crepitus.  Abdominal: Soft. Normal appearance and bowel sounds are normal. She exhibits no distension. There is no tenderness. There is no rebound and no guarding.  Genitourinary:  Normal external genitalia; some thick white discharge, vaginal redness, no rash, no bleeding. Bimanual was nontender.   Musculoskeletal: Normal range of motion. She exhibits no edema or tenderness.  Moves all extremities well.   Neurological: She is alert and oriented to person, place, and time. She has normal strength. No cranial nerve deficit.  Skin: Skin is  warm, dry and intact. No rash noted. No erythema. No pallor.  Psychiatric: She has a normal mood and affect. Her speech is normal and behavior is normal. Her mood appears not anxious.  Nursing note and vitals reviewed.   ED Course  Procedures   DIAGNOSTIC STUDIES: Oxygen Saturation is 98% on RA, normal by my interpretation.    COORDINATION OF CARE: 2:15 AM Discussed treatment plan with pt at bedside and pt agreed to plan.  Pt had recent GC and chlamydia testing done at her OB office and was not repeated.   3:17 AM Discussed results and discharge plan, including symptoms that indicate need for further evaluation, with patient; she verbalized understanding and agreed.      No results found.  Labs Review Results for orders placed or performed during the hospital encounter of 10/22/14  Wet prep, genital  Result Value Ref Range   Yeast Wet Prep HPF POC NONE SEEN NONE SEEN   Trich, Wet Prep NONE SEEN NONE SEEN   Clue Cells Wet Prep HPF POC MANY (A) NONE SEEN   WBC, Wet Prep HPF POC MANY (A) NONE  SEEN       Imaging Review No results found.   EKG Interpretation None      MDM   Final diagnoses:  BV (bacterial vaginosis)    Discharge Medication List as of 10/22/2014  2:58 AM    Metrogel   Plan discharge   I personally performed the services described in this documentation, which was scribed in my presence. The recorded information has been reviewed and considered.  Devoria Albe, MD, Concha Pyo, MD 10/22/14 [phone]

## 2014-10-22 NOTE — ED Notes (Signed)
Pt is [redacted] weeks pregnant and recently treated with antibiotics for bacterial vaginosis- began experiencing white clumpy vaginal discharge after treatment.  Pt has been using Monistat cream at home for the past 2 days without relief- admits to vaginal irritation.  Denies vaginal bleeding or urinary symptoms.

## 2014-10-22 NOTE — ED Notes (Signed)
Pt c/o vaginal irritation x 2 days. Reports being treated for BV; followed up with her ob/gyn in which she was told the "infection was gone" and was recommended monostat for irritation. pt began using monostat two days ago without relief.

## 2014-10-23 ENCOUNTER — Encounter (HOSPITAL_COMMUNITY): Payer: Self-pay

## 2014-10-23 ENCOUNTER — Ambulatory Visit (HOSPITAL_COMMUNITY)
Admission: RE | Admit: 2014-10-23 | Discharge: 2014-10-23 | Disposition: A | Discharge status: Home / Self Care | Payer: BC Managed Care – PPO | Source: Ambulatory Visit | Attending: Obstetrics & Gynecology | Admitting: Obstetrics & Gynecology

## 2014-10-23 ENCOUNTER — Other Ambulatory Visit (HOSPITAL_COMMUNITY): Payer: Self-pay | Admitting: Obstetrics & Gynecology

## 2014-10-23 ENCOUNTER — Encounter (HOSPITAL_COMMUNITY): Payer: Self-pay | Admitting: Obstetrics & Gynecology

## 2014-10-23 ENCOUNTER — Other Ambulatory Visit (HOSPITAL_COMMUNITY): Payer: Self-pay | Admitting: Maternal and Fetal Medicine

## 2014-10-23 DIAGNOSIS — I428 Other cardiomyopathies: Secondary | ICD-10-CM

## 2014-10-23 DIAGNOSIS — Z36 Encounter for antenatal screening of mother: Secondary | ICD-10-CM | POA: Insufficient documentation

## 2014-10-23 DIAGNOSIS — O99282 Endocrine, nutritional and metabolic diseases complicating pregnancy, second trimester: Secondary | ICD-10-CM | POA: Insufficient documentation

## 2014-10-23 DIAGNOSIS — Z3A18 18 weeks gestation of pregnancy: Secondary | ICD-10-CM | POA: Diagnosis not present

## 2014-10-23 DIAGNOSIS — O358XX0 Maternal care for other (suspected) fetal abnormality and damage, not applicable or unspecified: Secondary | ICD-10-CM | POA: Diagnosis not present

## 2014-10-23 DIAGNOSIS — O99212 Obesity complicating pregnancy, second trimester: Secondary | ICD-10-CM | POA: Diagnosis not present

## 2014-10-23 DIAGNOSIS — I059 Rheumatic mitral valve disease, unspecified: Secondary | ICD-10-CM | POA: Insufficient documentation

## 2014-10-23 DIAGNOSIS — E039 Hypothyroidism, unspecified: Secondary | ICD-10-CM | POA: Insufficient documentation

## 2014-10-23 DIAGNOSIS — Z8279 Family history of other congenital malformations, deformations and chromosomal abnormalities: Secondary | ICD-10-CM | POA: Insufficient documentation

## 2014-10-23 DIAGNOSIS — Z3689 Encounter for other specified antenatal screening: Secondary | ICD-10-CM | POA: Insufficient documentation

## 2014-10-23 DIAGNOSIS — O99210 Obesity complicating pregnancy, unspecified trimester: Secondary | ICD-10-CM | POA: Insufficient documentation

## 2014-11-03 ENCOUNTER — Ambulatory Visit: Payer: BC Managed Care – PPO | Admitting: Thoracic Surgery (Cardiothoracic Vascular Surgery)

## 2014-12-08 ENCOUNTER — Ambulatory Visit (INDEPENDENT_AMBULATORY_CARE_PROVIDER_SITE_OTHER): Payer: BC Managed Care – PPO | Admitting: Thoracic Surgery (Cardiothoracic Vascular Surgery)

## 2014-12-08 ENCOUNTER — Ambulatory Visit: Payer: BC Managed Care – PPO | Admitting: Thoracic Surgery (Cardiothoracic Vascular Surgery)

## 2014-12-08 ENCOUNTER — Encounter: Payer: Self-pay | Admitting: Thoracic Surgery (Cardiothoracic Vascular Surgery)

## 2014-12-08 VITALS — BP 145/98 | HR 80 | Resp 16 | Ht 62.0 in | Wt 293.0 lb

## 2014-12-08 DIAGNOSIS — Z9889 Other specified postprocedural states: Secondary | ICD-10-CM

## 2014-12-08 DIAGNOSIS — I340 Nonrheumatic mitral (valve) insufficiency: Secondary | ICD-10-CM

## 2014-12-08 NOTE — Patient Instructions (Signed)

## 2014-12-08 NOTE — Progress Notes (Signed)
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 Her postoperative recovery was uncomplicated and she was last seen here in our office on 02/10/2014. Since then she got pregnant. She is now approximately 6 months into her pregnancy. She has been seen recently by Dr. Katrinka Blazing who is following her carefully in conjunction with the high-risk obstetrics team in [place].  A follow-up echocardiogram was performed 09/30/2014 that demonstrates intact mitral valve repair with no residual mitral regurgitation. Mean transvalvular gradient across the mitral valve was estimated 9 mmHg at that time. Pulmonary artery pressures were slightly elevated, estimated 34 mmHg. Left ventricular systolic function was moderately reduced with ejection fraction estimated 35-40%. Clinically the patient is doing exceptionally well. She states that she only gets short of breath with strenuous physical activity and this does not limit her daily activities whatsoever. She has not had problems with fluid retention or lower extremity edema. Her blood pressure has remained under reasonably good control. She has no complaints and she is very excited to be a new mother.   Current Outpatient Prescriptions  Medication Sig Dispense Refill  . aspirin EC 81 MG tablet Take 81 mg by mouth daily.    . Ferrous Sulfate (FER-IRON PO) Take 80 mg by mouth 2 (two) times daily.    . metoprolol (LOPRESSOR) 50 MG tablet Take 50 mg by mouth daily.    . metroNIDAZOLE (METROGEL VAGINAL)  0.75 % vaginal gel Place 1 Applicatorful vaginally 2 (two) times daily. 70 g 0  . Multiple Vitamin (MULTIVITAMIN) tablet Take 1 tablet by mouth daily.    . Prenatal Vit-Fe Fumarate-FA (PRENATAL MULTIVITAMIN) TABS tablet Take 1 tablet by mouth daily at 12 noon.     No current facility-administered medications for this visit.      Physical Exam:   BP 145/98 mmHg  Pulse 80  Resp 16  Ht 5\' 2"  (1.575 m)  Wt 293 lb (132.904 kg)  BMI 53.58 kg/m2  SpO2 99%  General:  Well-appearing  Chest:   Clear  CV:   Regular rate and rhythm without murmur  Incisions:  Completely healed  Abdomen:  Soft and nontender  Extremities:  Warm and well perfused, no lower extremity edema  Diagnostic Tests:  Transthoracic Echocardiography  Patient:  Paula, Wolfe MR #:    [id number] Study Date: 09/30/2014 Gender:   F Age:    76 Height:   157.5 cm Weight:   130.6 kg BSA:    2.48 m^2 Pt. Status: Room:  SONOGRAPHER Dewitt Hoes, RDCS PERFORMING  [facility], Outpatient ATTENDING  [place], Ema Wakuru ORDERING   [place], Ema Wakuru REFERRING  [place], [place] Wakuru  cc:  ------------------------------------------------------------------- LV EF: 35% -  40%  ------------------------------------------------------------------- Indications:   Pulmonary hypertension (I27).  ------------------------------------------------------------------- History:  PMH:  Congestive heart failure. Hypertrophic cardiomyopathy. Risk factors: [redacted] weeks pregnant. Pulmonary hypertension. Near syncope. PNA. Acute respiratory failure. UTI. Hypothyroidism. Anemia. Morbidly obese.  ------------------------------------------------------------------- Study Conclusions  - Left ventricle: The cavity size was normal.  Systolic function was moderately reduced. The estimated ejection fraction was in the range of 35% to 40%. Wall motion was normal; there were no regional wall motion  abnormalities. - Mitral valve: Prior procedures included surgical repair. An annular ring prosthesis was present and functioning normally. Mean gradient (D): 9 mm Hg. (Functional moderate stenosis) Valve area by pressure half-time: 2.24 cm^2. Valve area by continuity equation (using LVOT flow): 1 cm^2. - Left atrium: The atrium was mildly dilated. Anterior-posterior dimension: 42 mm. - Pulmonary arteries: Systolic pressure was mildly increased. PA peak pressure: 34 mm Hg (S).  Impressions:  - Compared to the prior study, there has been no significant interval change.  ------------------------------------------------------------------- Labs, prior tests, procedures, and surgery: Transthoracic echocardiography (09/27/2013).   EF was 35%.  Valve surgery.   Mitral valve repair. Transthoracic echocardiography. M-mode, complete 2D, spectral Doppler, and color Doppler. Birthdate: Patient birthdate: 16-Jun-1986. Age: Patient is 29 yr old. Sex: Gender: female. BMI: 52.7 kg/m^2. Blood pressure:   145/87 Patient status: Outpatient. Study date: Study date: 09/30/2014. Study time: 04:24 PM. Location: [facility]  -------------------------------------------------------------------  ------------------------------------------------------------------- Left ventricle: The cavity size was normal. Systolic function was moderately reduced. The estimated ejection fraction was in the range of 35% to 40%. Wall motion was normal; there were no regional wall motion abnormalities.  ------------------------------------------------------------------- Aortic valve:  Trileaflet; normal thickness leaflets. Mobility was not restricted. Doppler: Transvalvular velocity was within the normal range. There was no stenosis. There was no regurgitation.  ------------------------------------------------------------------- Aorta: Aortic root: The aortic root was normal in  size.  ------------------------------------------------------------------- Mitral valve: Prior procedures included surgical repair. An annular ring prosthesis was present and functioning normally. Mobility was not restricted. Doppler: There was no regurgitation.  Valve area by pressure half-time: 2.24 cm^2. Indexed valve area by pressure half-time: 0.9 cm^2/m^2. Valve area by continuity equation (using LVOT flow): 1 cm^2. Indexed valve area by continuity equation (using LVOT flow): 0.4 cm^2/m^2.  Mean gradient (D): 9 mm Hg. (Functional moderate stenosis) Peak gradient (D): 13 mm Hg.  ------------------------------------------------------------------- Left atrium: The atrium was mildly dilated.  ------------------------------------------------------------------- Right ventricle: The cavity size was normal. Wall thickness was normal. Systolic function was normal.  ------------------------------------------------------------------- Pulmonic valve:  Poorly visualized. Structurally normal valve. Cusp separation was normal. Doppler: Transvalvular velocity was within the normal range. There was no evidence for stenosis. There was no regurgitation.  ------------------------------------------------------------------- Tricuspid valve:  Structurally normal valve.  Doppler: Transvalvular velocity was within the normal range. There was trivial regurgitation.  ------------------------------------------------------------------- Pulmonary artery:  The main pulmonary artery was normal-sized. Systolic pressure was mildly increased.  ------------------------------------------------------------------- Right atrium: The atrium was normal in size.  ------------------------------------------------------------------- Pericardium: There was no pericardial effusion.  ------------------------------------------------------------------- Systemic veins: Inferior vena cava: The vessel  was normal in size.  ------------------------------------------------------------------- Measurements  Left ventricle              Value     Reference LV ID, ED, PLAX chordal          48.4 mm    43 - 52 LV ID, ES, PLAX chordal      (H)   41.3 mm    23 - 38 LV fx shortening, PLAX chordal  (L)   15  %    >=29 LV PW thickness, ED            9.78 mm    --------- IVS/LV PW ratio, ED            1.02      <=  1.3 Stroke volume, 2D             56  ml    --------- Stroke volume/bsa, 2D           23  ml/m^2  --------- LV e&', lateral              10  cm/s   --------- LV E/e&', lateral             18.2      --------- LV e&', medial               5.56 cm/s   --------- LV E/e&', medial              32.73     --------- LV e&', average              7.78 cm/s   --------- LV E/e&', average             23.39     ---------  Ventricular septum            Value     Reference IVS thickness, ED             9.94 mm    ---------  LVOT                   Value     Reference LVOT ID, S                20  mm    --------- LVOT area                 3.14 cm^2   --------- LVOT peak velocity, S           103  cm/s   --------- LVOT mean velocity, S           71.4 cm/s   --------- LVOT VTI, S                17.7 cm    ---------  Aorta                   Value     Reference Aortic root ID, ED            27  mm    ---------  Left atrium                Value     Reference LA ID, A-P, ES              42  mm    --------- LA ID/bsa, A-P               1.7  cm/m^2  <=2.2 LA volume, S               72  ml    --------- LA volume/bsa, S             29.1 ml/m^2  --------- LA volume, ES, 1-p A4C          64  ml    --------- LA volume/bsa, ES, 1-p A4C        25.8 ml/m^2  --------- LA volume, ES, 1-p A2C          78  ml    --------- LA volume/bsa, ES, 1-p A2C        31.5 ml/m^2  ---------  Mitral valve  Value     Reference Mitral E-wave peak velocity        182  cm/s   --------- Mitral A-wave peak velocity        150  cm/s   --------- Mitral mean velocity, D          142  cm/s   --------- Mitral deceleration time     (H)   370  ms    150 - 230 Mitral pressure half-time         98  ms    --------- Mitral mean gradient, D          9   mm Hg  --------- Mitral peak gradient, D          13  mm Hg  --------- Mitral E/A ratio, peak          1.2      --------- Mitral valve area, PHT, DP        2.24 cm^2   --------- Mitral valve area/bsa, PHT, DP      0.9  cm^2/m^2 --------- Mitral valve area, LVOT          1   cm^2   --------- continuity Mitral valve area/bsa, LVOT        0.4  cm^2/m^2 --------- continuity Mitral annulus VTI, D           55.6 cm    ---------  Pulmonary arteries            Value     Reference PA pressure, S, DP        (H)   34  mm Hg  <=30  Tricuspid valve              Value     Reference Tricuspid regurg peak velocity      254  cm/s   --------- Tricuspid peak RV-RA gradient       26  mm Hg  ---------  Systemic veins              Value     Reference Estimated CVP               8   mm Hg  ---------  Right ventricle               Value     Reference RV pressure, S, DP        (H)   34  mm Hg  <=30 RV s&', lateral, S             12.8 cm/s   ---------  Legend: (L) and (H) mark values outside specified reference range.  ------------------------------------------------------------------- Prepared and Electronically Authenticated by  Donato Schultz, M.D. 2016-02-16T17:20:45   Impression:  Patient is doing very well approximately one year following a minimally invasive mitral valve repair. She is now approximately 6 months pregnant and getting along remarkably well without any significant complications. Late follow-up echocardiogram looks good, demonstrating intact mitral valve repair. She does have moderate left ventricular systolic dysfunction with ejection fraction estimated 35-40%, down from approximately 50% prior to surgery.  There is mild functional mitral stenosis and very mild pulmonary hypertension, all of which is expected following valve repair using restrictive ring annuloplasty.    Plan:  The patient will continue to follow up closely with Dr. Katrinka Blazing during the remainder of her third trimester in pregnancy. She has been reminded regarding the lifelong need for antibiotic prophylaxis for all dental cleaning and related procedures.  She will call and return to see [place] as needed in the future.  All of her questions have been addressed.  I spent in excess of 15 minutes during the conduct of this office consultation and >50% of this time involved direct face-to-face encounter with the patient for counseling and/or coordination of their care.   [name], MD 12/08/2014 5:40 PM

## 2014-12-30 ENCOUNTER — Ambulatory Visit (INDEPENDENT_AMBULATORY_CARE_PROVIDER_SITE_OTHER): Payer: BC Managed Care – PPO | Admitting: Interventional Cardiology

## 2014-12-30 ENCOUNTER — Encounter: Payer: Self-pay | Admitting: Interventional Cardiology

## 2014-12-30 VITALS — BP 128/72 | HR 101 | Resp 16 | Ht 62.0 in | Wt 290.0 lb

## 2014-12-30 DIAGNOSIS — I270 Primary pulmonary hypertension: Secondary | ICD-10-CM

## 2014-12-30 DIAGNOSIS — I428 Other cardiomyopathies: Secondary | ICD-10-CM

## 2014-12-30 DIAGNOSIS — Z9889 Other specified postprocedural states: Secondary | ICD-10-CM

## 2014-12-30 DIAGNOSIS — I429 Cardiomyopathy, unspecified: Secondary | ICD-10-CM

## 2014-12-30 DIAGNOSIS — I272 Pulmonary hypertension, unspecified: Secondary | ICD-10-CM

## 2014-12-30 DIAGNOSIS — I5042 Chronic combined systolic (congestive) and diastolic (congestive) heart failure: Secondary | ICD-10-CM | POA: Diagnosis not present

## 2014-12-30 NOTE — Patient Instructions (Signed)
Medication Instructions:  Your physician recommends that you continue on your current medications as directed. Please refer to the Current Medication list given to you today.  Labwork: None   Testing/Procedures: None   Follow-Up: Your physician recommends that you schedule a follow-up appointment as needed   Any Other Special Instructions Will Be Listed Below (If Applicable).   

## 2014-12-30 NOTE — Progress Notes (Signed)
Cardiology Office Note   Date:  12/30/2014   ID:  Paula Wolfe, DOB October 15, 1985, MRN [id number]  PCP:  Domingo Sep, MD  Cardiologist:  Lesleigh Noe, MD   Chief Complaint  Patient presents with  . CHRONIC COMBINED SYSTOLIC AND DIASTOLIC CHF      History of Present Illness: Paula Wolfe is a 29 y.o. female who presents for follow-up mitral valve repair, and systolic left ventricular dysfunction. She is now greater than 6 months pregnant and is due in July 2016. She denies orthopnea PND. There is no lower extremity swelling. She has not had syncope. She recently had an echocardiogram performed at [facility] and based upon care everywhere the report reveals an EF of 45-50%. She has a patent foramen ovale. Right heart function is normal. No evidence of pulmonary hypertension.  She is doing well. Her pregnancy is going well. She's having a boy. When the school year is over she will relocate to [place] to be closer to family. The baby will be delivered at the [facility]. She already has contacted the high risk OB section at [facility]. She will also need to establish with a cardiologist in [place].    Past Medical History  Diagnosis Date  . Hypothyroidism   . Nontoxic multinodular goiter     a. Pt reports prior ultrasound/bx that were negative but was told she would need to keep an eye on them in the future. Benign FNA per [facility] for this dx.  . Menometrorrhagia     a. Improved after tx of hypothyroidism, but recurred 05/2013.  . Broken foot     a. Remote broken foot with residual chronic LLE edema.  . Acute on chronic combined systolic and diastolic heart failure 09/12/2013  . Mitral regurgitation 09/12/2013    severe by TEE  . Multinodular goiter   . Pulmonary hypertension 09/13/2013  . Obesity   . Anemia     iron deficient, attributed to menometrorrhagia  . Chronic combined systolic and diastolic  CHF (congestive heart failure)   . Morbid obesity with BMI of 45.0-49.9, adult 09/16/2013  . H/O syncope     prior to finding out about the mitral regurgitation  . Shortness of breath     sometimes with exertion  . Headache(784.0)     headaches  . 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 Non-ischemic cardiomyopathy     Past Surgical History  Procedure Laterality Date  . External ear surgery    . Mitral valve repair Right 09/19/2013    Procedure: MINIMALLY INVASIVE MITRAL VALVE REPAIR (MVR) OR REPLACEMENT;  Surgeon: Purcell Nails, MD;  Location: [facility] OR;  Service: Open Heart Surgery;  Laterality: Right;  . Intraoperative transesophageal echocardiogram N/A 09/19/2013    Procedure: INTRAOPERATIVE TRANSESOPHAGEAL ECHOCARDIOGRAM;  Surgeon: Purcell Nails, MD;  Location[facility] OR;  Service: Open Heart Surgery;  Laterality: N/A;  . Left and right heart catheterization with coronary angiogram N/A 09/13/2013    Procedure: LEFT AND RIGHT HEART CATHETERIZATION WITH CORONARY ANGIOGRAM;  Surgeon: Lesleigh Noe, MD;  Location: [facility] CATH LAB;  Service: Cardiovascular;  Laterality: N/A;     Current Outpatient Prescriptions  Medication Sig Dispense Refill  . Ferrous Sulfate (FER-IRON PO) Take 80 mg by mouth 2 (two) times daily.    Marland Kitchen levothyroxine (SYNTHROID, LEVOTHROID) 75 MCG tablet  Take 75 mcg by mouth every morning.    . magnesium oxide (MAG-OX) 400 (241.3 MG) MG tablet Take 400 mg by mouth daily as needed. (HEADACHES)    . metoprolol (LOPRESSOR) 50 MG tablet Take 25 mg by mouth daily.     . Multiple Vitamin (MULTIVITAMIN) tablet Take 1 tablet by mouth daily.    . Prenatal Vit-Fe Fumarate-FA (PRENATAL MULTIVITAMIN) TABS tablet Take 1 tablet by mouth daily at 12 noon.     No current facility-administered medications for this visit.    Allergies:   Review of patient's allergies indicates no known  allergies.    Social History:  The patient  reports that she has never smoked. She has never used smokeless tobacco. She reports that she drinks alcohol. She reports that she does not use illicit drugs.   Family History:  The patient's family history includes Heart Problems in her maternal grandfather; Hypertension in her father, mother, and sister.    ROS:  Please see the history of present illness.   Otherwise, review of systems are positive for fatigue likely related to her pregnancy..   All other systems are reviewed and negative.    PHYSICAL EXAM: VS:  BP 128/72 mmHg  Pulse 101  Resp 16  Ht 5\' 2"  (1.575 m)  Wt 290 lb (131.543 kg)  BMI 53.03 kg/m2  SpO2 98% , BMI Body mass index is 53.03 kg/(m^2). GEN: Well nourished, well developed, in no acute distress HEENT: normal Neck: no JVD, carotid bruits, or masses Cardiac: RRR; no murmurs, rubs, or gallops,no edema  Respiratory:  clear to auscultation bilaterally, normal work of breathing GI: soft, nontender, nondistended, + BS MS: no deformity or atrophy Skin: warm and dry, no rash Neuro:  Strength and sensation are intact Psych: euthymic mood, full affect   EKG:  EKG is not ordered today.    Recent Labs: 09/05/2014: ALT 14; BUN 9; Creatinine 0.82; Hemoglobin 13.2; Platelets 189; Potassium 3.6; Sodium 138    Lipid Panel No results found for: CHOL, TRIG, HDL, CHOLHDL, VLDL, LDLCALC, LDLDIRECT    Wt Readings from Last 3 Encounters:  12/30/14 290 lb (131.543 kg)  12/08/14 293 lb (132.904 kg)  10/22/14 282 lb (127.914 kg)      Other studies Reviewed: Additional studies/ records that were reviewed today include: [facility]. Review of the above records demonstrates: The echocardiogram revealed preserved LV function with improvement and  estimated EF now 45-50%.   ASSESSMENT AND PLAN:  Chronic combined systolic and diastolic CHF (congestive heart failure): No evidence of heart failure or volume  overload  Non-ischemic cardiomyopathy: LVEF is now 45-50%  S/P minimally invasive mitral valve repair: There is no regurgitation noted  Pulmonary hypertension: No evidence of pulmonary hypertension on the recent echo at [facility]  Severe obesity (BMI >= 40)  Pregnancy, third trimester.  Current medicines are reviewed at length with the patient today.  The patient does not have concerns regarding medicines.  The following changes have been made:  no change  Labs/ tests ordered today include:  No orders of the defined types were placed in this encounter.     Disposition:   FU with at [facility] as needed Signed, Lesleigh Noe, MD  12/30/2014 5:52 PM    [facility] [address] [place], [place], [place]  [address] Phone: [phone]; Fax: [phone]

## 2015-05-11 IMAGING — CR DG CHEST 1V PORT
1 series · 1 of 1 positions shown · non-contrast
Comparison: None.

CLINICAL DATA: Chest pain, shortness of breath

EXAM:
PORTABLE CHEST - 1 VIEW

[AP]
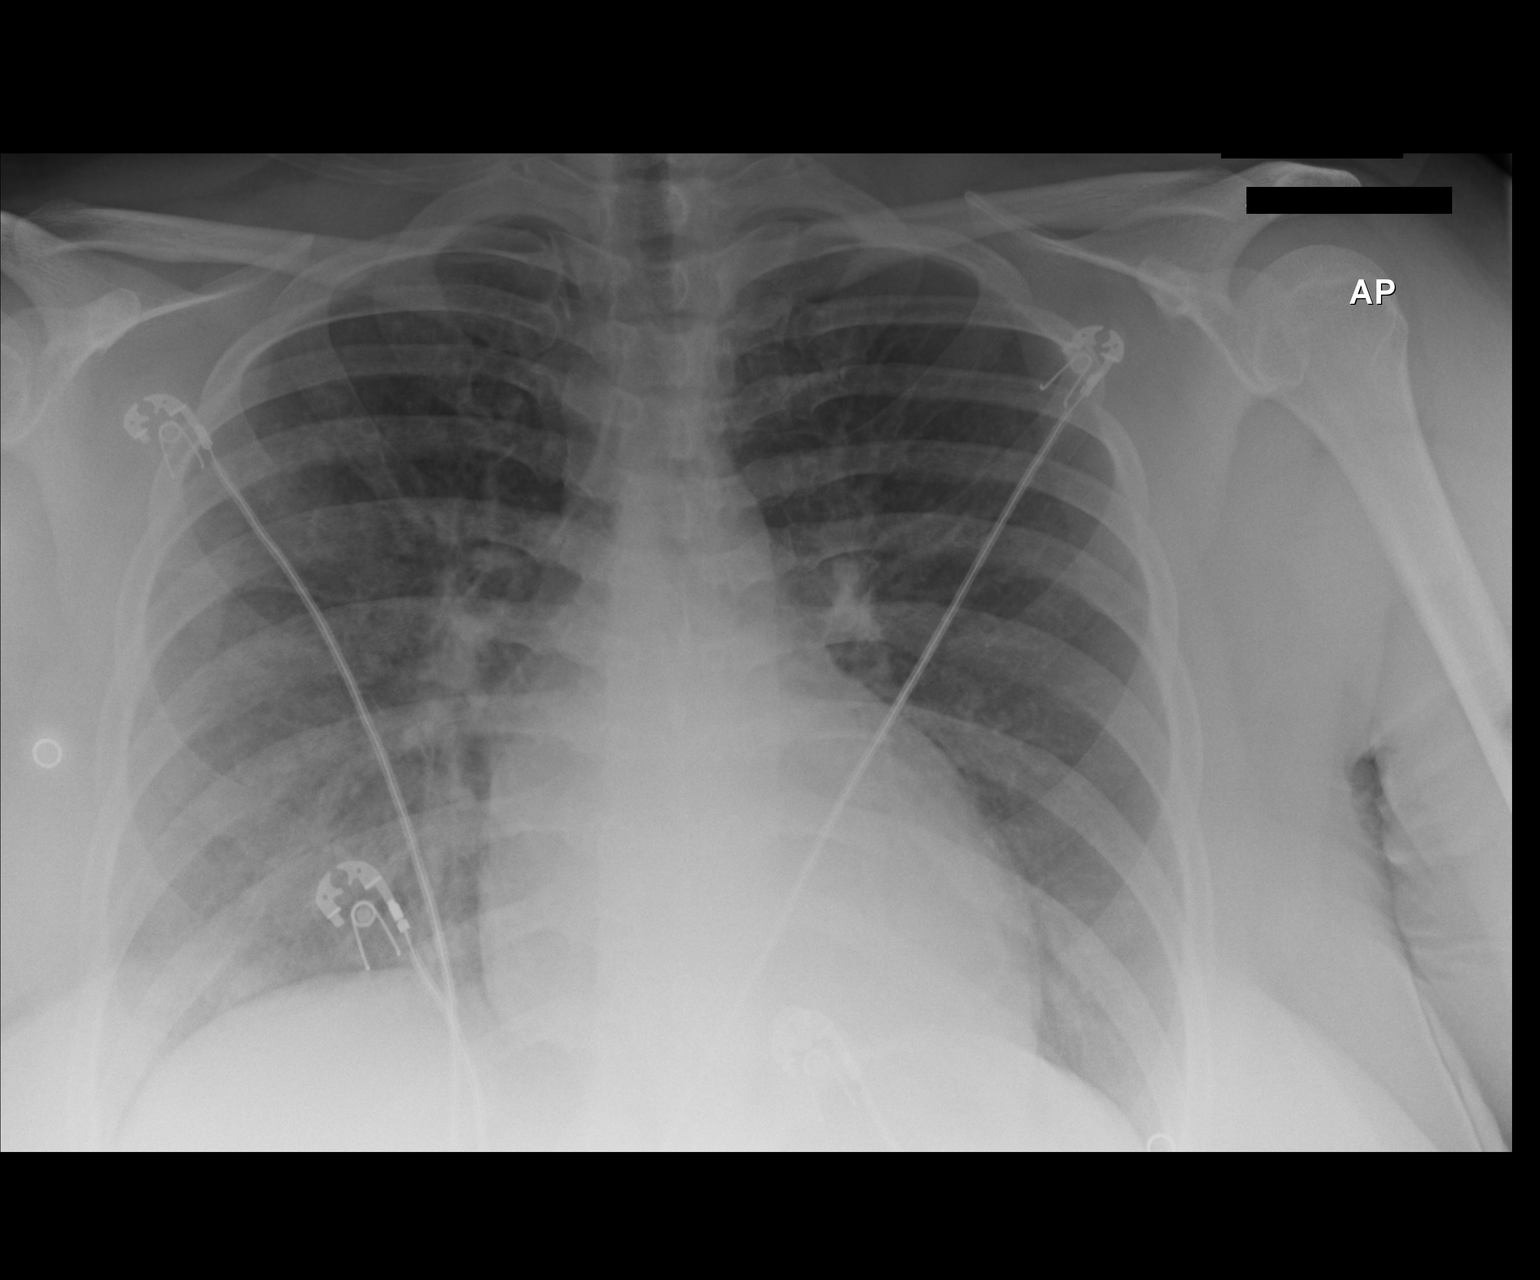

[1 of 1 positions shown; findings below may reference images not displayed]

FINDINGS: Prominent heart size but normal vascularity. No edema, pneumonia,
collapse or consolidation. No effusion or pneumothorax. Trachea
midline.
IMPRESSION: No active disease.

## 2015-05-18 IMAGING — CR DG CHEST 1V PORT
1 series · 1 of 1 positions shown · non-contrast
Comparison: DG CHEST 2 VIEW dated 09/17/2013

CLINICAL DATA: Status post CABG

EXAM:
PORTABLE CHEST - 1 VIEW

[AP]
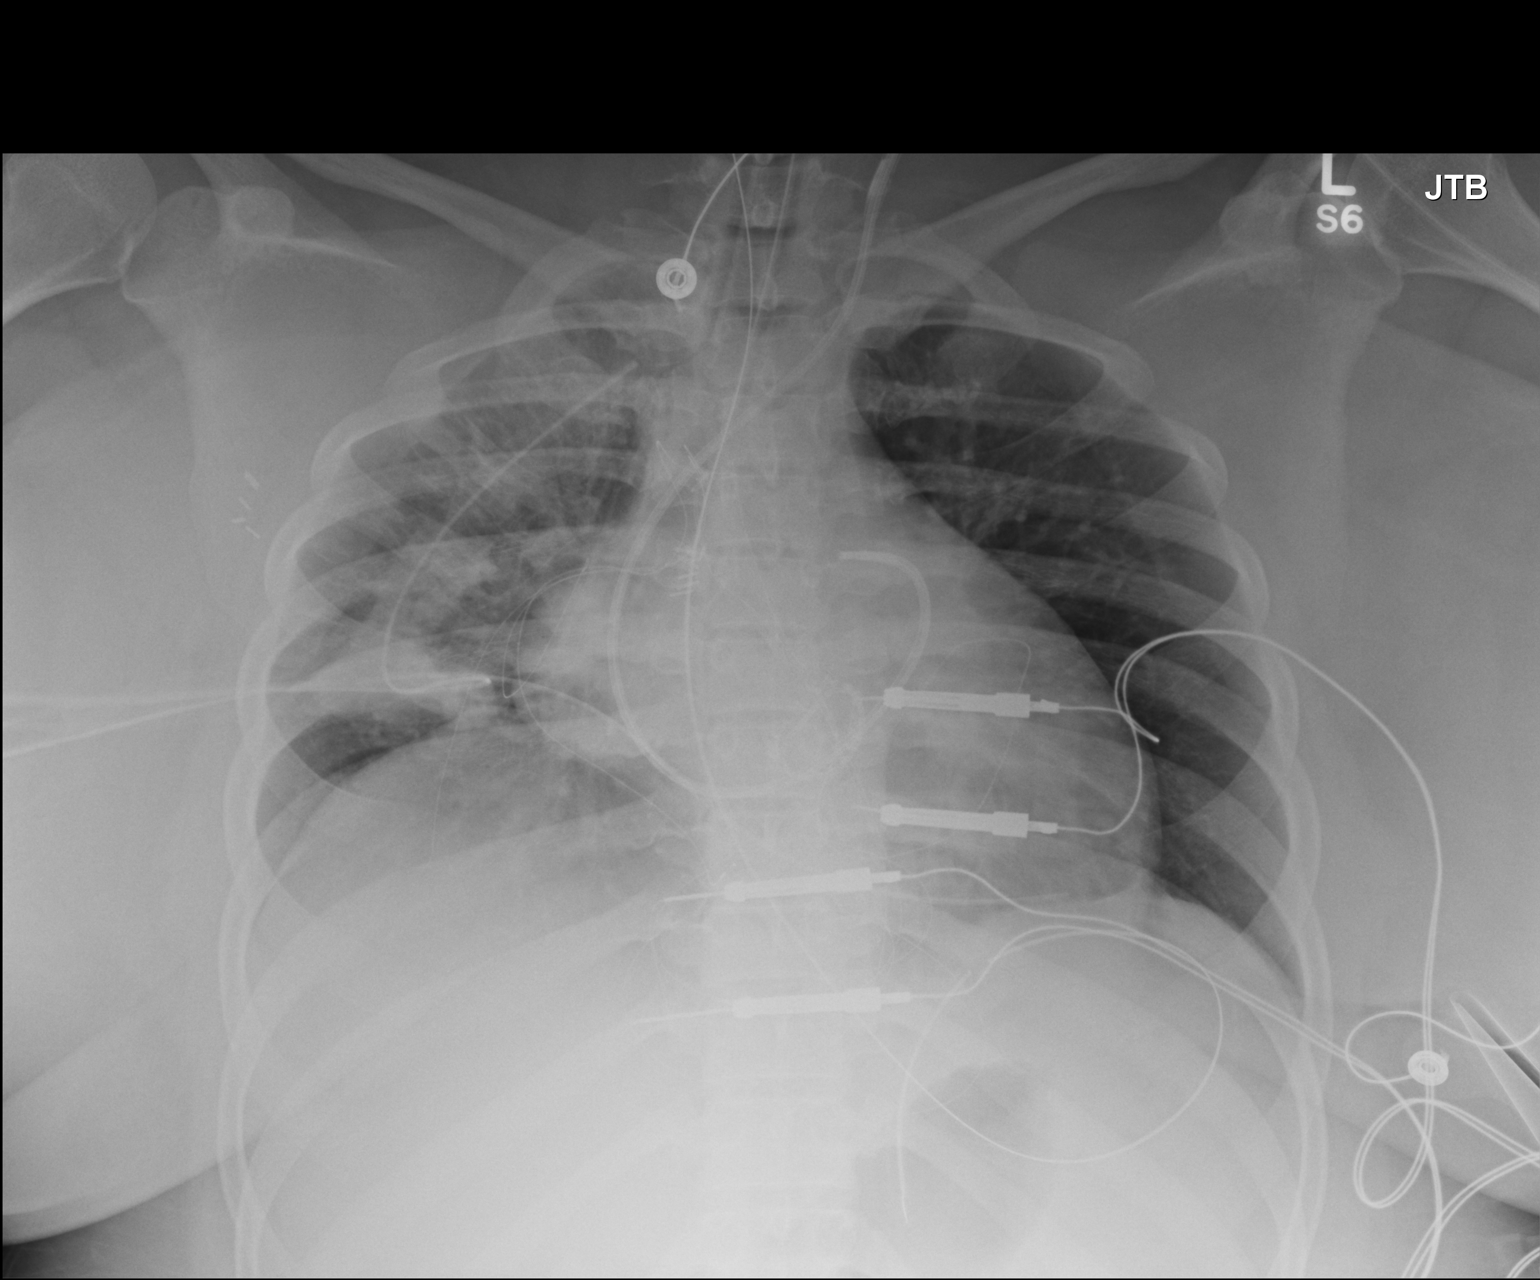

[1 of 1 positions shown; findings below may reference images not displayed]

FINDINGS: The lung volumes are low. Mildly increased interstitial density on
the right is present. There is no pneumothorax or significant
pleural effusion. The cardiopericardial silhouette is mildly
enlarged. The central pulmonary vascularity is prominent. There is a
chest tube in place on the right with the tip projecting between the
posterior medial second and third right rib interspace. The
endotracheal tube tip lies approximately 3.3 cm above the crotch of
the carina. The Swan-Ganz catheter has been placed via the left
internal jugular approach. Its tip lies in the region of the
proximal right main pulmonary outflow tract. The esophagogastric
tube tip in proximal or project below the level of the GE junction.
IMPRESSION: 1. There is bilateral pulmonary hypoinflation. The right-sided chest
tube as well as the Swan-Ganz catheter PICC appear to be in
reasonable position. Positioning of the endotracheal tube is
adequate. The interstitial markings within the right lung are mildly
increased. There is no pleural effusion or pneumothorax
demonstrated.
2. The right-sided chest tube, the Swan-Ganz catheter, and the
esophagogastric tube appear to be in reasonable position.

## 2015-05-21 IMAGING — CR DG CHEST 1V PORT
1 series · 1 of 1 positions shown · non-contrast
Comparison: 09/22/2013 [DATE] hrs

CLINICAL DATA: Wire removal

EXAM:
PORTABLE CHEST - 1 VIEW

[AP]
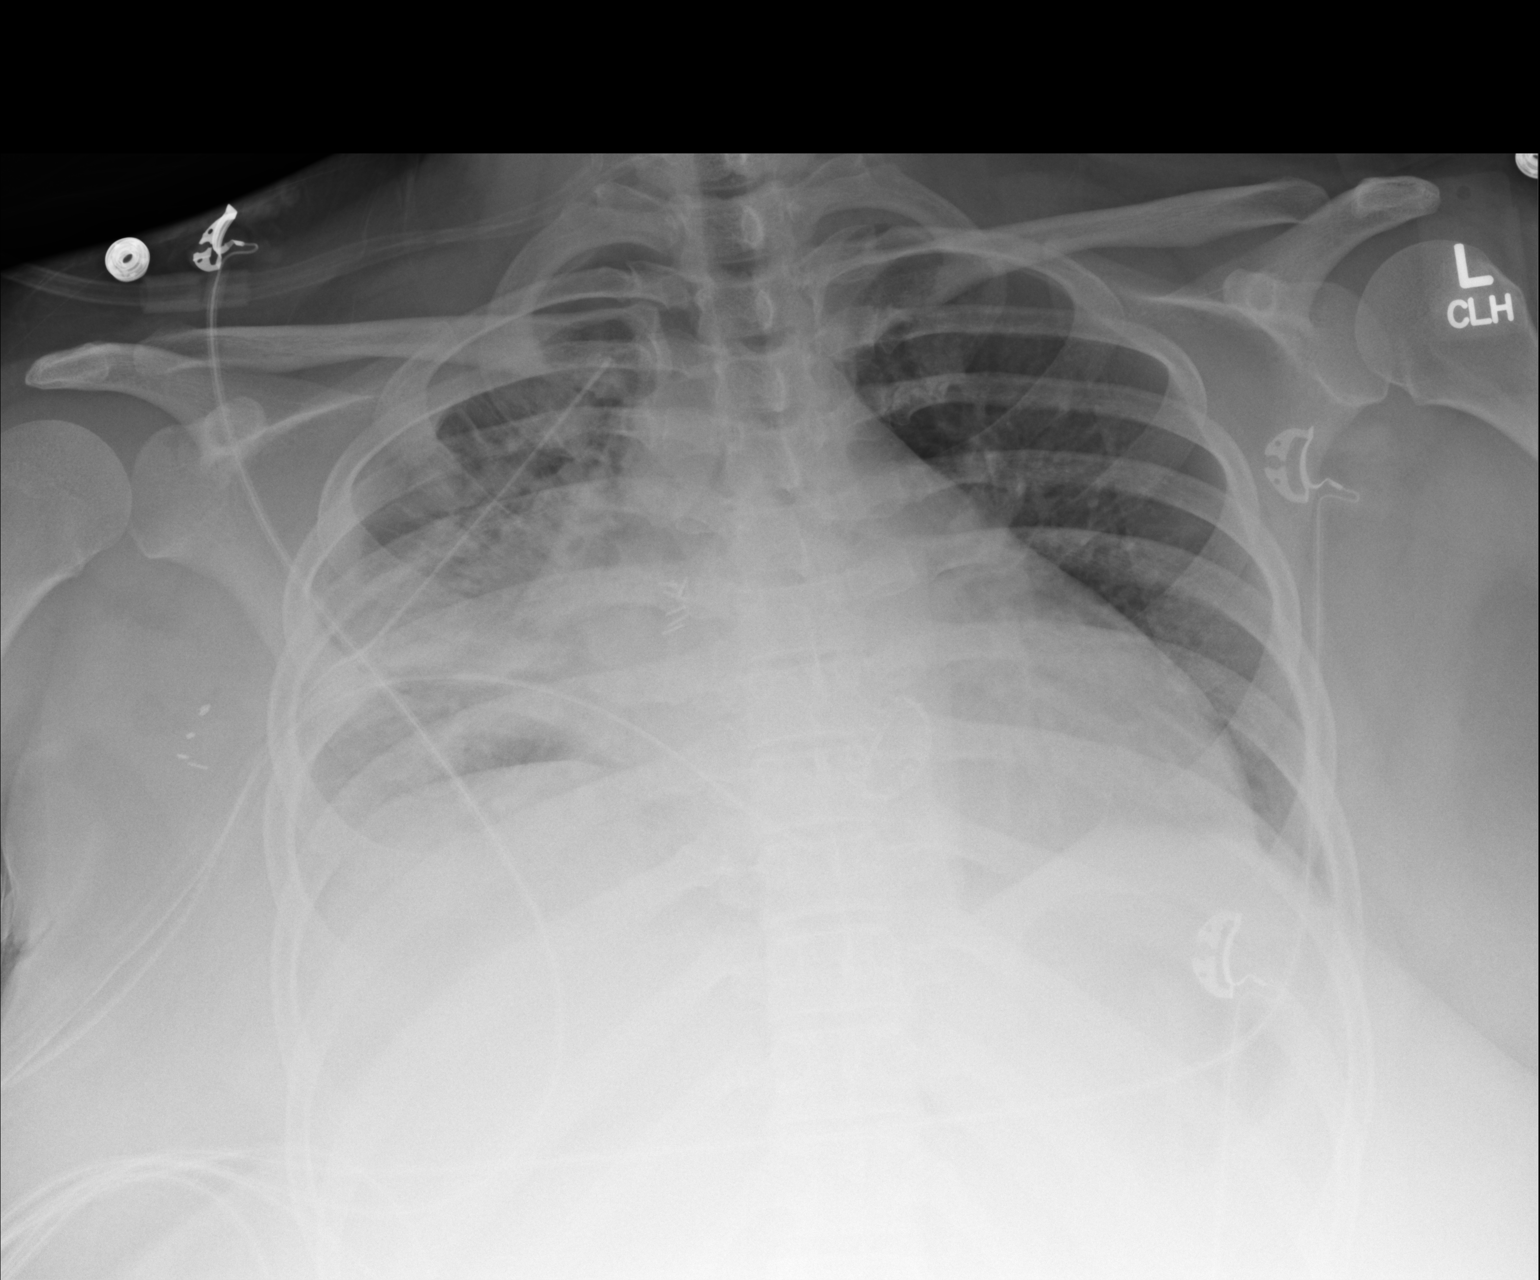

[1 of 1 positions shown; findings below may reference images not displayed]

FINDINGS: Cardiac shadow is stable. Postsurgical changes are again noted.
Chest tubes are again seen on the right. Persistent infiltrative
changes are noted. There are been interval removal of fine wires
over the lower right cardiac shadow. No new abnormality is noted
IMPRESSION: Stable changes on the right.

No complicating factors following wire removal.

## 2015-05-22 IMAGING — CR DG CHEST 2V
2 series · 2 of 2 positions shown · non-contrast
Comparison: 09/22/2013

CLINICAL DATA: Chest tube removal.

EXAM:
CHEST  2 VIEW

[w chest pa]
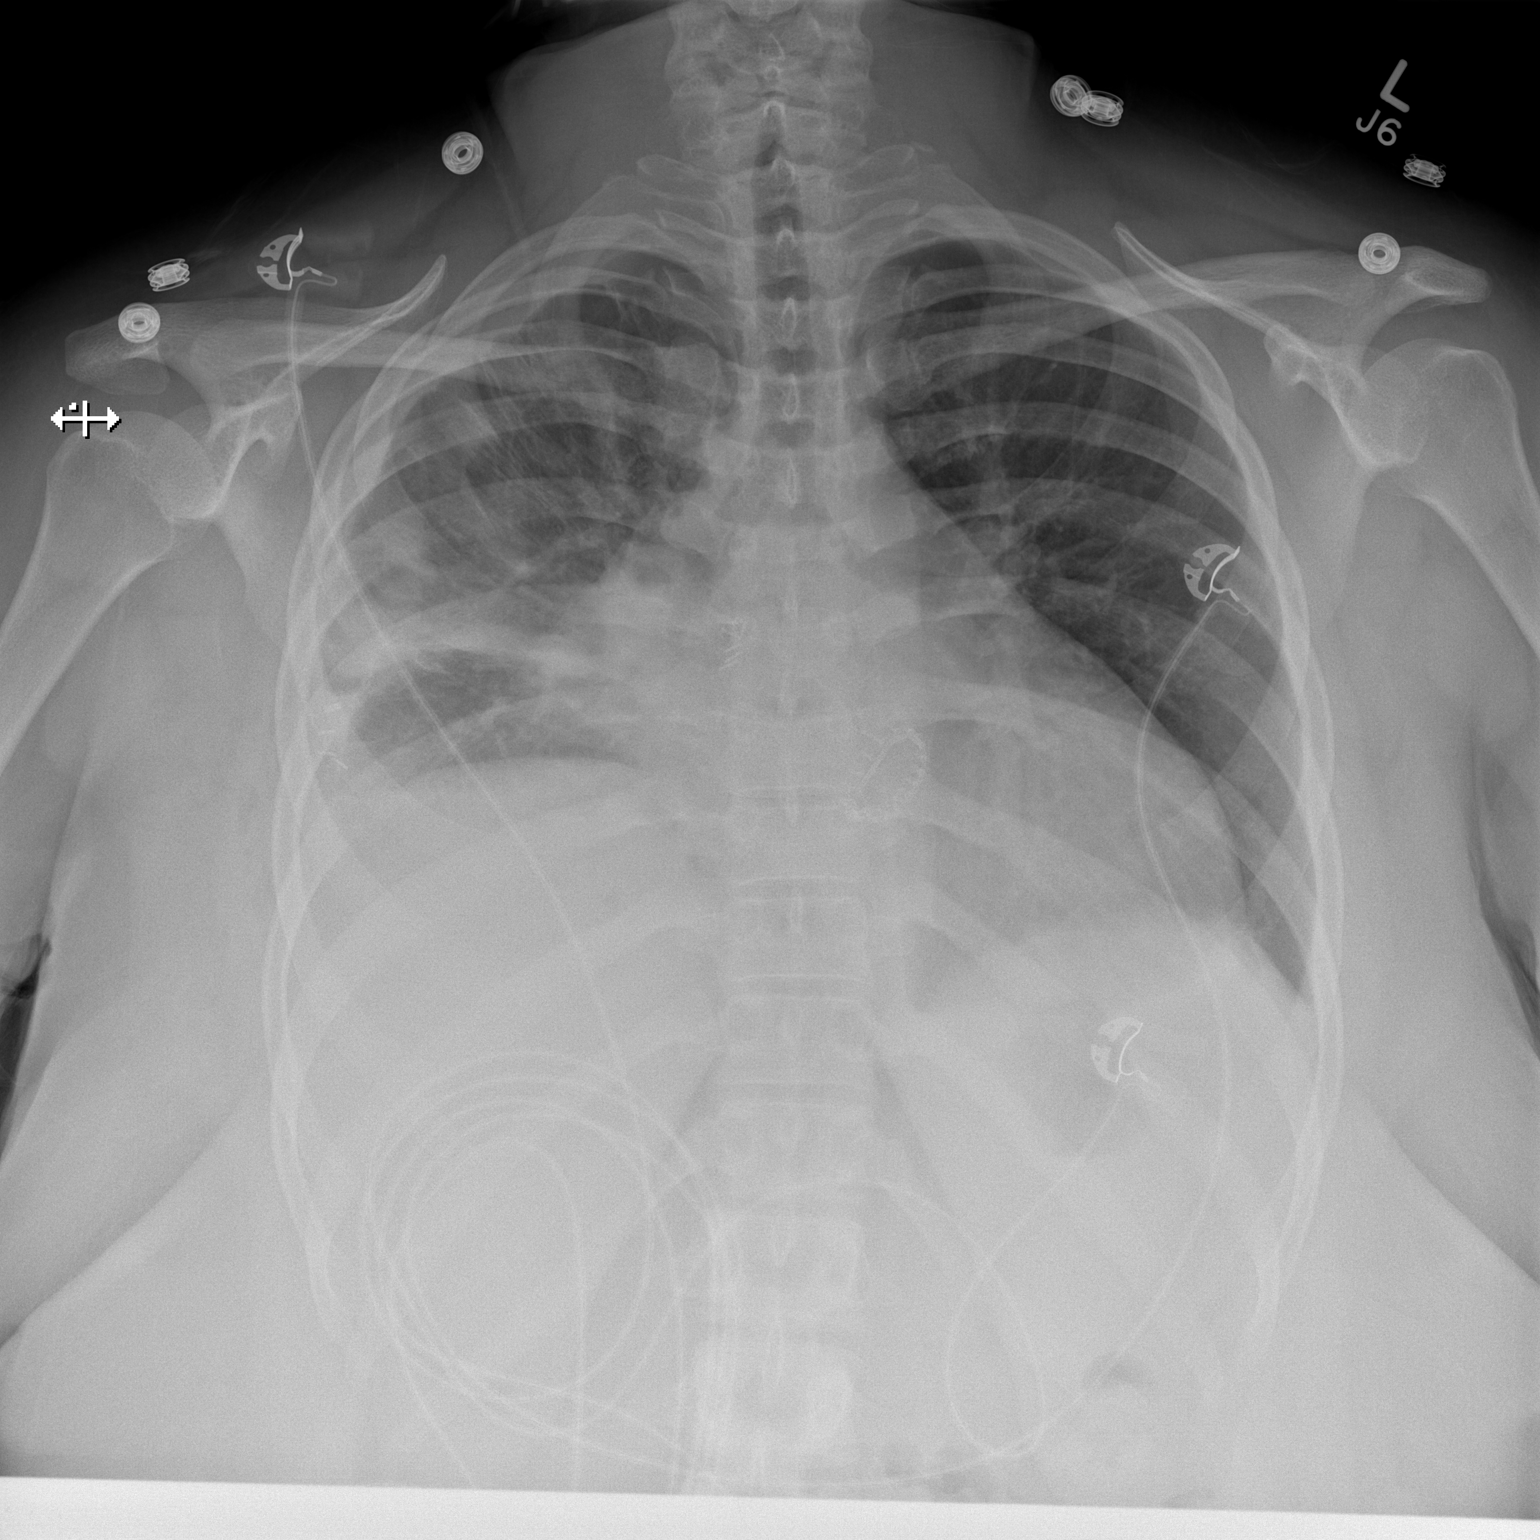

[w chest lat]
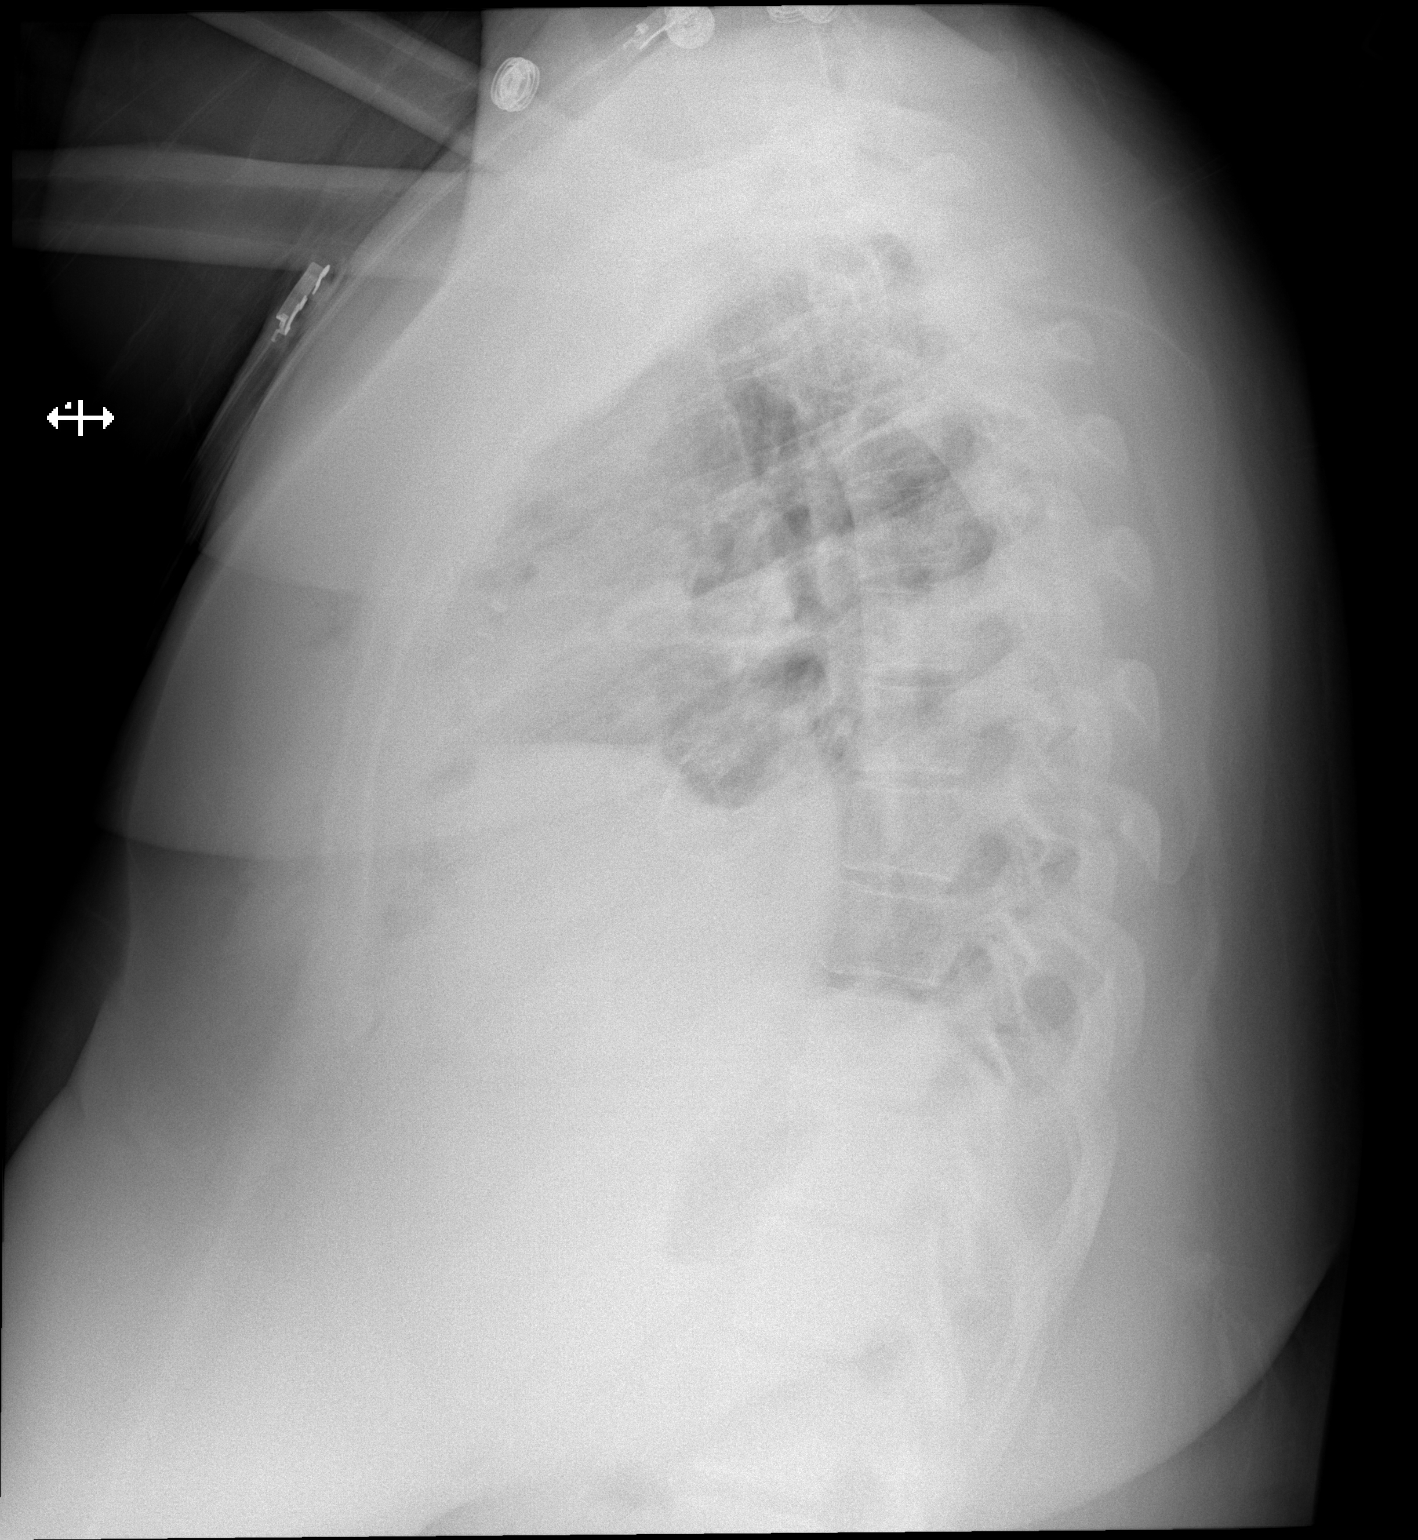

[2 of 2 positions shown; findings below may reference images not displayed]

FINDINGS: Since the prior exam, the dual right sided chest tubes have been
removed.

There are coarse reticular and hazy airspace opacities in the right
lung that are without significant change most of which is likely
atelectasis. The left lung is clear.

Cardiac silhouette is mildly enlarged. Changes from cardiac surgery
and valve replacement.
IMPRESSION: 1. Status post right chest tube removal.  No pneumothorax.
2. Persistent low lung volumes on the right and right lung opacity
most of which is likely atelectasis.

## 2015-08-01 ENCOUNTER — Encounter (HOSPITAL_COMMUNITY): Payer: Self-pay | Admitting: *Deleted

## 2016-06-14 ENCOUNTER — Encounter: Payer: Self-pay | Admitting: Physician Assistant

## 2016-06-21 NOTE — Progress Notes (Signed)
Cardiology Office Note    Date:  06/27/2016   ID:  Paula AblesShatina Renee Higdon, DOB 10-20-1985, MRN [id number]  PCP:  Pcp Not In System  Cardiologist:  Dr. Katrinka BlazingSmith   CC: follow up   History of Present Illness:  Paula Wolfe is a 30 y.o. female with a history of severe MR s/p minimally invasive MV repair (09/2013), morbid obesity, HTN, hypothyroidism, chronic systolic CHF who presents to clinic for follow up.   She underwent minimally invasive mitral valve repair on 09/19/2013 by Dr. 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 EF by echo a week after surgery showed EF 30-35%.   She was last seen by Dr. Katrinka BlazingSmith in 12/2014 for follow up. She was 6 months pregnant at the time. [facility] EF 45-50%, no MR and no pulmonary HTN.    Had some high BPs and delivered a little early by emergency C section in 02/2015 in [place].  She is followed by Dr. Gearldine BienenstockFramm there for cardiology (phone number [phone]).  She is now living in [place] permanently.  She was recently at [facility] for palpitations and chest pain. She was told she had an irregular heart beat and that her valve was leaking again. She has a copy of her 2D ECHO (05/25/16) on her phone which showed mildly dilated LV (EF 36%), mod-severe global HK, well seated annuloplasty ring. Mild MR with fibrocalcific changes associated with mitral valve. Mild TR/ PR. Her new cardiologist wanted to start her on Coreg and she is here for a second opinion.   Today she presents to clinic for evaluation. She has not had any more palpitations or chest pain since [facility] the hospital. [facility], [facility] or PND. No dizziness or syncope.   Past Medical History:  Diagnosis Date  . Acute on chronic combined systolic and diastolic heart failure (HCC) 09/12/2013  .  Anemia    iron deficient, attributed to menometrorrhagia  . Broken foot    a. Remote broken foot with residual chronic LLE edema.  . Chronic combined systolic and diastolic CHF (congestive heart failure) (HCC)   . H/O syncope    prior to finding out about the mitral regurgitation  . Headache(784.0)    headaches  . Hypothyroidism   . Menometrorrhagia    a. Improved after tx of hypothyroidism, but recurred 05/2013.  . Mitral regurgitation 09/12/2013   severe by TEE  . Morbid obesity with BMI of 45.0-49.9, adult (HCC) 09/16/2013  . Multinodular goiter   . Non-ischemic cardiomyopathy (HCC)   . Nontoxic multinodular goiter    a. Pt reports prior ultrasound/bx that were negative but was told she would need to keep an eye on them in the future. Benign FNA per [facility] for this dx.  . Obesity   . Pulmonary hypertension 09/13/2013  . S/P minimally invasive mitral valve repair 09/19/2013   Sorin Memo 3D ring annuloplasty, size 26 mm, placed via right anterior thoractomy approach  . Shortness of breath    sometimes with exertion    Past Surgical History:  Procedure Laterality Date  . EXTERNAL EAR SURGERY    . INTRAOPERATIVE TRANSESOPHAGEAL ECHOCARDIOGRAM N/A 09/19/2013   Procedure: INTRAOPERATIVE TRANSESOPHAGEAL ECHOCARDIOGRAM;  Surgeon: Purcell Nailslarence H Owen, MD;  [facility];  Service: Open Heart Surgery;  Laterality: N/A;  . LEFT AND RIGHT  HEART CATHETERIZATION WITH CORONARY ANGIOGRAM N/A 09/13/2013   Procedure: LEFT AND RIGHT HEART CATHETERIZATION WITH CORONARY ANGIOGRAM;  Surgeon: Lesleigh Noe, MD;  Location: [facility] CATH LAB;  Service: Cardiovascular;  Laterality: N/A;  . MITRAL VALVE REPAIR Right 09/19/2013   Procedure: MINIMALLY INVASIVE MITRAL VALVE REPAIR (MVR) OR REPLACEMENT;  Surgeon: Purcell Nails, MD;  Location: [facility] OR;  Service: Open Heart Surgery;  Laterality: Right;    Current Medications: Outpatient Medications Prior to Visit  Medication Sig Dispense Refill  . levothyroxine  (SYNTHROID, LEVOTHROID) 100 MCG tablet Take 100 mcg by mouth daily before breakfast.    . metoprolol (LOPRESSOR) 50 MG tablet Take 25 mg by mouth daily.      No facility-administered medications prior to visit.      Allergies:   Patient has no known allergies.   Social History   Social History  . Marital status: Single    Spouse name: N/A  . Number of children: N/A  . Years of education: N/A   Occupational History  .  [employer]   Social History Main Topics  . Smoking status: Never Smoker  . Smokeless tobacco: Never Used  . Alcohol use Yes     Comment: occasionally - 1-2x/month  . Drug use: No  . Sexual activity: Yes    Birth control/ protection: None   Other Topics Concern  . Not on file   Social History Narrative  . No narrative on file     Family History:  The patient's family history includes Heart Problems in her maternal grandfather; Hypertension in her father, mother, and sister.     ROS:   Please see the history of present illness.    ROS All other systems reviewed and are negative.   PHYSICAL EXAM:   VS:  BP (!) 144/84 (BP Location: Right Arm, Patient Position: Sitting, Cuff Size: Large)   Pulse 78   Ht 5\' 2"  (1.575 m)   Wt 278 lb 12.8 oz (126.5 kg)   SpO2 99%   BMI 50.99 kg/m    GEN: Well nourished, well developed, in no acute distress, obese HEENT: normal  Neck: no JVD, carotid bruits, or masses Cardiac: RRR; no murmurs, rubs, or gallops,no edema  Respiratory:  clear to auscultation bilaterally, normal work of breathing GI: soft, nontender, nondistended, + BS MS: no deformity or atrophy  Skin: warm and dry, no rash Neuro:  Alert and Oriented x 3, Strength and sensation are intact Psych: euthymic mood, full affect  Wt Readings from Last 3 Encounters:  06/27/16 278 lb 12.8 oz (126.5 kg)  12/30/14 290 lb (131.5 kg)  12/08/14 293 lb (132.9 kg)      Studies/Labs Reviewed:   EKG:  EKG is ordered today.  The ekg  ordered today demonstrates NSR HR 72  Recent Labs: No results found for requested labs within last 8760 hours.   Lipid Panel No results found for: CHOL, TRIG, HDL, CHOLHDL, VLDL, LDLCALC, LDLDIRECT  Additional studies/ records that were reviewed today include:  2D ECHO: 12/25/2014  No bubble study or contrast used - patient is pregnant.  SUMMARY  The left ventricular size is normal.  There is normal left ventricular wall thickness.  Left ventricular systolic function is mildly reduced.  LV ejection fraction = 45-50%.  Left ventricular filling pattern is indeterminate.  There is mild global hypokinesis of the left ventricle.  The right ventricle is normal in size and function.  The left atrium  is mildly dilated.  There is Doppler evidence for a left-to-right inter-atrial shunt.  Status post mitral valve repair with annuloplasty ring  There is mild mitral regurgitation.  There is no pericardial effusion.  No prior study available for comparison.   ASSESSMENT & PLAN:   Palpitations/chest pressure: hard to know exactly what these were without records, but agree with BB which will also treat her cardiomyopathy and HTN  Chronic combined systolic and diastolic CHF (congestive heart failure): No evidence of heart failure or volume overload. No need for diuretic.  Non-ischemic cardiomyopathy:  2D ECHO (05/25/16) on her phone showed mildly dilated LV (EF 36%), mod-severe global HK, well seated annuloplasty ring. Mild MR with fibrocalcific changes associated with mitral valve. Mild TR/ PR.  -- Continue Lisinopril. Agree with cardiologist at [facility] to start Coreg.   S/P minimally invasive mitral valve repair: mild MR on most recent echo  HTN: BP mildly elevated on lisinopril 10mg  daily. Add Coreg per primary cardiologist at [facility].   Pulmonary hypertension: No evidence of pulmonary hypertension on most recent echo   Super Morbid obesity: (BMI >= 40)  Hypothyroidism:  continue synthroid   Medication Adjustments/Labs and Tests Ordered: Current medicines are reviewed at length with the patient today.  Concerns regarding medicines are outlined above.  Medication changes, Labs and Tests ordered today are listed in the Patient Instructions below. Patient Instructions  Medication Instructions:  Your physician recommends that you continue on your current medications as directed. Please refer to the Current Medication list given to you today.   Labwork: None ordered  Testing/Procedures: None ordered  Follow-Up: Your physician recommends that you schedule a follow-up appointment in: AT YOUR CONVENIENCE   Any Other Special Instructions Will Be Listed Below (If Applicable).     If you need a refill on your cardiac medications before your next appointment, please call your pharmacy.      Signed, Cline Crock, PA-C  06/27/2016 4:23 PM    [facility] [address] [place], [place], [place]  [address] Phone: [phone]; Fax: [phone]

## 2016-06-27 ENCOUNTER — Ambulatory Visit (INDEPENDENT_AMBULATORY_CARE_PROVIDER_SITE_OTHER): Payer: BC Managed Care – PPO | Admitting: Physician Assistant

## 2016-06-27 ENCOUNTER — Encounter: Payer: Self-pay | Admitting: Thoracic Surgery (Cardiothoracic Vascular Surgery)

## 2016-06-27 ENCOUNTER — Ambulatory Visit (INDEPENDENT_AMBULATORY_CARE_PROVIDER_SITE_OTHER): Payer: BC Managed Care – PPO | Admitting: Thoracic Surgery (Cardiothoracic Vascular Surgery)

## 2016-06-27 VITALS — BP 156/108 | HR 68 | Resp 16 | Ht 62.0 in

## 2016-06-27 VITALS — BP 144/84 | HR 78 | Ht 62.0 in | Wt 278.8 lb

## 2016-06-27 DIAGNOSIS — I10 Essential (primary) hypertension: Secondary | ICD-10-CM

## 2016-06-27 DIAGNOSIS — I5042 Chronic combined systolic (congestive) and diastolic (congestive) heart failure: Secondary | ICD-10-CM

## 2016-06-27 DIAGNOSIS — I272 Pulmonary hypertension, unspecified: Secondary | ICD-10-CM | POA: Diagnosis not present

## 2016-06-27 DIAGNOSIS — Z9889 Other specified postprocedural states: Secondary | ICD-10-CM | POA: Diagnosis not present

## 2016-06-27 DIAGNOSIS — I428 Other cardiomyopathies: Secondary | ICD-10-CM | POA: Diagnosis not present

## 2016-06-27 DIAGNOSIS — I340 Nonrheumatic mitral (valve) insufficiency: Secondary | ICD-10-CM

## 2016-06-27 NOTE — Progress Notes (Signed)
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 Her postoperative recovery was uneventful. Follow-up echocardiogram performed 09/30/2014 revealed intact mitral valve repair with no residual mitral regurgitation and left ventricular ejection fraction was estimated 35-40%, all unchanged from previously. At the time she was pregnant and transvalvular gradients across the mitral valve were slightly elevated, which was presumed secondary to increased cardiac output and transvalvular flow. She was followed carefully by Dr. Katrinka Blazing and by her obstetrician in the high risk OB clinic until the third trimester when the patient moved to [place]. She apparently subsequently underwent emergency cesarean section for the delivery of her child. Reportedly both did well. The patient states that she was hospitalized approximately 1 month ago with atypical chest pain and palpitations. An echocardiogram was done at that time and the patient's Synthroid dose was adjusted. She has seen a new cardiologist in [place] who has also prescribed a beta blocker.  She has not filled this prescription and she called our office requesting a second opinion. She is accompanied by her family to our office today. She did not bring any medical records with her, nor did she bring a copy of the echocardiogram performed recently. She does have a copy of the echo report on her cell phone. She describes stable symptoms of exertional  shortness of breath. Recently she complains of some occasional episodes where she feels her heart racing and has some atypical chest discomfort. These symptoms are not necessarily associated with activity. She has not been able to lose any weight. She does not know the dosages of the medications she takes presently, but she knows that she takes both Synthroid and lisinopril.  She states that she no longer takes metoprolol.   Current Outpatient Prescriptions  Medication Sig Dispense Refill  . levothyroxine (SYNTHROID, LEVOTHROID) 100 MCG tablet Take 100 mcg by mouth daily before breakfast.    . metoprolol (LOPRESSOR) 50 MG tablet Take 25 mg by mouth daily.      No current facility-administered medications for this visit.       Physical Exam:   BP (!) 156/108 (BP Location: Left Arm, Patient Position: Sitting, Cuff Size: Large)   Pulse 68   Resp 16   Ht 5\' 2"  (1.575 m)   SpO2 98% Comment: ON RA  General:  Morbidly obese but well appearing  Chest:   Clear to auscultation  CV:   Regular rate and rhythm without murmur  Incisions:  n/a  Abdomen:  Soft nontender  Extremities:  Warm and well-perfused  Diagnostic Tests:  Report from transthoracic echocardiogram performed at [facility] in [place] approximately one month ago is reviewed on the patient's cellular phone. By report the patient's ejection fraction was calculated 36%.  By report there was no mitral stenosis and mild mitral regurgitation. No other significant abnormalities were noted.   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  Valve repair was successfully accomplished using an undersized ring annuloplasty (26 mm Sorin memo 3-D annuloplasty  ring).  It is impossible for me to comment in telemetry gently upon the patient's recent symptoms, hospitalization, or follow-up echocardiogram without having medical records and a copy of the echocardiogram images for review. However, based upon review of the report the echo findings sounded similar to the patient's last follow-up echocardi[facility] in February [facility]. The patient is hypertensive and overweight. She has recently been experiencing symptoms of palpitations in addition to chronic stable symptoms of mild exertional shortness of breath. Addition of a beta blocker seems reasonable under the circumstances but I would defer to her primary cardiologist. Her blood pressure clearly needs to be brought under better control and the potential benefits of regular exercise and weight loss cannot be over emphasized.    Plan:  I have recommended that the patient follow up carefully with her new cardiologist in [place]. If she has specific questions about his recommendations she should discuss them further with him. I would be happy to review her follow-up echocardiogram if she provide me with a copy of the echo on disc. I have strongly encouraged her to find a way to make regular exercise, heart healthy diet, and weight loss part of her daily life. I have also suggested that she measured her blood pressure on a regular basis and keep a log to review with her cardiologist to make his job easier to adjust her medications appropriately. Finally, the patient has been reminded regarding the importance of dental hygiene and the lifelong need for antibiotic prophylaxis for all dental cleanings and other related invasive procedures.  The patient will call and return to see us in the future only should specific problems or questions arise.   I spent in excess of 15 minutes during the conduct of this office consultation and >50% of this time involved direct face-to-face encounter with the patient  for counseling and/or coordination of their care.    Salvatore Decentlarence H. Cornelius Moraswen, MD 06/27/2016 2:49 PM

## 2016-06-27 NOTE — Patient Instructions (Addendum)
Continue all previous medications without any changes at this time  Monitor your blood pressure daily and keep a log for your cardiologist to review  You have been advised of the numerous benefits associated with regular exercise and weight loss.  The long term benefits for your overall health and wellbeing cannot be overestimated.  Endocarditis is a potentially serious infection of heart valves or inside lining of the heart.  It occurs more commonly in patients with diseased heart valves (such as patient's with aortic or mitral valve disease) and in patients who have undergone heart valve repair or replacement.  Certain surgical and dental procedures may put you at risk, such as dental cleaning, other dental procedures, or any surgery involving the respiratory, urinary, gastrointestinal tract, gallbladder or prostate gland.   To minimize your chances for develooping endocarditis, maintain good oral health and seek prompt medical attention for any infections involving the mouth, teeth, gums, skin or urinary tract.    Always notify your doctor or dentist about your underlying heart valve condition before having any invasive procedures. You will need to take antibiotics before certain procedures, including all routine dental cleanings or other dental procedures.  Your cardiologist or dentist should prescribe these antibiotics for you to be taken ahead of time.

## 2016-06-27 NOTE — Patient Instructions (Signed)
Medication Instructions:  Your physician recommends that you continue on your current medications as directed. Please refer to the Current Medication list given to you today.   Labwork: None ordered  Testing/Procedures: None ordered  Follow-Up: Your physician recommends that you schedule a follow-up appointment in: AT YOUR CONVENIENCE   Any Other Special Instructions Will Be Listed Below (If Applicable).     If you need a refill on your cardiac medications before your next appointment, please call your pharmacy.
